# Patient Record
Sex: Male | Born: 1950 | Race: Black or African American | Hispanic: No | Marital: Married | Smoking: Former smoker
Health system: Southern US, Community
[De-identification: ages and names within clinical notes are randomized; demographics above are authoritative.]

## PROBLEM LIST (undated history)

## (undated) DIAGNOSIS — I119 Hypertensive heart disease without heart failure: Secondary | ICD-10-CM

## (undated) DIAGNOSIS — Z9289 Personal history of other medical treatment: Secondary | ICD-10-CM

## (undated) DIAGNOSIS — I43 Cardiomyopathy in diseases classified elsewhere: Secondary | ICD-10-CM

## (undated) DIAGNOSIS — N189 Chronic kidney disease, unspecified: Secondary | ICD-10-CM

## (undated) DIAGNOSIS — M1990 Unspecified osteoarthritis, unspecified site: Secondary | ICD-10-CM

## (undated) DIAGNOSIS — I351 Nonrheumatic aortic (valve) insufficiency: Secondary | ICD-10-CM

## (undated) DIAGNOSIS — E119 Type 2 diabetes mellitus without complications: Secondary | ICD-10-CM

## (undated) DIAGNOSIS — I10 Essential (primary) hypertension: Secondary | ICD-10-CM

## (undated) DIAGNOSIS — H269 Unspecified cataract: Secondary | ICD-10-CM

## (undated) DIAGNOSIS — H409 Unspecified glaucoma: Secondary | ICD-10-CM

## (undated) DIAGNOSIS — M109 Gout, unspecified: Secondary | ICD-10-CM

## (undated) DIAGNOSIS — E785 Hyperlipidemia, unspecified: Secondary | ICD-10-CM

## (undated) DIAGNOSIS — I509 Heart failure, unspecified: Secondary | ICD-10-CM

## (undated) HISTORY — PX: WRIST SURGERY: SHX841

## (undated) HISTORY — DX: Hypertensive heart disease without heart failure: I43

## (undated) HISTORY — DX: Hyperlipidemia, unspecified: E78.5

## (undated) HISTORY — PX: WISDOM TOOTH EXTRACTION: SHX21

## (undated) HISTORY — DX: Nonrheumatic aortic (valve) insufficiency: I35.1

## (undated) HISTORY — DX: Unspecified glaucoma: H40.9

## (undated) HISTORY — DX: Unspecified cataract: H26.9

## (undated) HISTORY — PX: HERNIA REPAIR: SHX51

## (undated) HISTORY — DX: Hypertensive heart disease without heart failure: I11.9

## (undated) HISTORY — PX: COLONOSCOPY: SHX174

## (undated) HISTORY — PX: CYST REMOVAL HAND: SHX6279

## (undated) HISTORY — DX: Personal history of other medical treatment: Z92.89

## (undated) HISTORY — DX: Chronic kidney disease, unspecified: N18.9

## (undated) HISTORY — DX: Type 2 diabetes mellitus without complications: E11.9

## (undated) HISTORY — PX: POLYPECTOMY: SHX149

---

## 1999-05-03 ENCOUNTER — Ambulatory Visit (HOSPITAL_COMMUNITY): Admission: RE | Admit: 1999-05-03 | Discharge: 1999-05-03 | Payer: Self-pay | Admitting: *Deleted

## 2001-11-07 ENCOUNTER — Ambulatory Visit (HOSPITAL_BASED_OUTPATIENT_CLINIC_OR_DEPARTMENT_OTHER): Admission: RE | Admit: 2001-11-07 | Discharge: 2001-11-07 | Payer: Self-pay | Admitting: General Surgery

## 2002-07-08 ENCOUNTER — Ambulatory Visit (HOSPITAL_COMMUNITY): Admission: RE | Admit: 2002-07-08 | Discharge: 2002-07-08 | Payer: Self-pay | Admitting: *Deleted

## 2002-07-08 ENCOUNTER — Encounter (INDEPENDENT_AMBULATORY_CARE_PROVIDER_SITE_OTHER): Payer: Self-pay | Admitting: Specialist

## 2002-11-02 ENCOUNTER — Emergency Department (HOSPITAL_COMMUNITY): Admission: EM | Admit: 2002-11-02 | Discharge: 2002-11-03 | Payer: Self-pay | Admitting: Emergency Medicine

## 2002-11-02 ENCOUNTER — Encounter: Payer: Self-pay | Admitting: Emergency Medicine

## 2004-06-30 ENCOUNTER — Ambulatory Visit: Payer: Self-pay | Admitting: Cardiology

## 2004-07-06 ENCOUNTER — Encounter: Payer: Self-pay | Admitting: Cardiovascular Disease

## 2004-07-06 ENCOUNTER — Ambulatory Visit: Payer: Self-pay

## 2004-08-05 ENCOUNTER — Ambulatory Visit: Payer: Self-pay | Admitting: Cardiology

## 2004-08-05 ENCOUNTER — Ambulatory Visit: Payer: Self-pay

## 2004-08-12 ENCOUNTER — Ambulatory Visit: Payer: Self-pay | Admitting: Cardiology

## 2004-08-17 ENCOUNTER — Ambulatory Visit: Payer: Self-pay | Admitting: Cardiology

## 2004-08-26 ENCOUNTER — Ambulatory Visit: Payer: Self-pay | Admitting: Cardiology

## 2004-09-06 ENCOUNTER — Ambulatory Visit: Payer: Self-pay | Admitting: Cardiology

## 2004-10-04 ENCOUNTER — Ambulatory Visit: Payer: Self-pay | Admitting: Cardiology

## 2006-05-30 ENCOUNTER — Ambulatory Visit (HOSPITAL_COMMUNITY): Admission: RE | Admit: 2006-05-30 | Discharge: 2006-05-30 | Payer: Self-pay | Admitting: Family Medicine

## 2006-06-06 ENCOUNTER — Ambulatory Visit: Payer: Self-pay | Admitting: Cardiology

## 2006-06-15 ENCOUNTER — Ambulatory Visit: Payer: Self-pay

## 2006-06-15 ENCOUNTER — Encounter: Payer: Self-pay | Admitting: Cardiology

## 2007-01-19 ENCOUNTER — Ambulatory Visit: Payer: Self-pay | Admitting: Cardiology

## 2007-06-19 ENCOUNTER — Ambulatory Visit: Payer: Self-pay | Admitting: Cardiology

## 2008-01-27 ENCOUNTER — Emergency Department (HOSPITAL_COMMUNITY): Admission: EM | Admit: 2008-01-27 | Discharge: 2008-01-27 | Payer: Self-pay | Admitting: Emergency Medicine

## 2008-05-06 ENCOUNTER — Encounter: Admission: RE | Admit: 2008-05-06 | Discharge: 2008-05-06 | Payer: Self-pay | Admitting: Family Medicine

## 2008-12-28 ENCOUNTER — Emergency Department (HOSPITAL_COMMUNITY): Admission: EM | Admit: 2008-12-28 | Discharge: 2008-12-28 | Payer: Self-pay | Admitting: Emergency Medicine

## 2009-03-07 DIAGNOSIS — I10 Essential (primary) hypertension: Secondary | ICD-10-CM

## 2009-03-07 DIAGNOSIS — M109 Gout, unspecified: Secondary | ICD-10-CM

## 2009-03-07 DIAGNOSIS — I5042 Chronic combined systolic (congestive) and diastolic (congestive) heart failure: Secondary | ICD-10-CM | POA: Insufficient documentation

## 2009-03-07 DIAGNOSIS — I119 Hypertensive heart disease without heart failure: Secondary | ICD-10-CM | POA: Insufficient documentation

## 2009-03-10 ENCOUNTER — Ambulatory Visit: Payer: Self-pay | Admitting: Cardiology

## 2009-03-12 LAB — CONVERTED CEMR LAB
CO2: 29 meq/L (ref 19–32)
Calcium: 9.4 mg/dL (ref 8.4–10.5)
Chloride: 106 meq/L (ref 96–112)
Creatinine, Ser: 1.7 mg/dL — ABNORMAL HIGH (ref 0.4–1.5)
Glucose, Bld: 105 mg/dL — ABNORMAL HIGH (ref 70–99)

## 2009-03-16 ENCOUNTER — Telehealth: Payer: Self-pay | Admitting: Cardiology

## 2009-03-23 ENCOUNTER — Telehealth (INDEPENDENT_AMBULATORY_CARE_PROVIDER_SITE_OTHER): Payer: Self-pay | Admitting: *Deleted

## 2009-03-23 ENCOUNTER — Encounter: Payer: Self-pay | Admitting: Cardiology

## 2009-03-23 ENCOUNTER — Ambulatory Visit: Payer: Self-pay

## 2009-04-03 ENCOUNTER — Encounter (INDEPENDENT_AMBULATORY_CARE_PROVIDER_SITE_OTHER): Payer: Self-pay | Admitting: *Deleted

## 2009-04-15 ENCOUNTER — Ambulatory Visit: Payer: Self-pay | Admitting: Cardiology

## 2009-10-06 ENCOUNTER — Telehealth: Payer: Self-pay | Admitting: Cardiology

## 2009-10-14 ENCOUNTER — Ambulatory Visit: Payer: Self-pay | Admitting: Cardiology

## 2010-07-11 ENCOUNTER — Encounter: Payer: Self-pay | Admitting: Cardiology

## 2010-07-12 ENCOUNTER — Telehealth (INDEPENDENT_AMBULATORY_CARE_PROVIDER_SITE_OTHER): Payer: Self-pay | Admitting: *Deleted

## 2010-07-27 NOTE — Progress Notes (Signed)
Summary: refill meds/ pt need meds today going out of today in am   Phone Note Refill Request Call back at Home Phone [phone] Message from:  Patient on October 06, 2009 3:10 PM  Refills Requested: Medication #1:  CARVEDILOL 12.5 MG TABS 1 tab two times a day [facility] [address]   Method Requested: Fax to [facility] Initial call taken by: Neil Crouch,  October 06, 2009 3:11 PM Reason for Call: Talk to Nurse Summary of Call: pt going out of town in am , need meds today./     Prescriptions: CARVEDILOL 12.5 MG TABS (CARVEDILOL) 1 tab two times a day  #60 x 11   Entered by:   Julaine Hua, CMA   Authorized by:   Renella Cunas, MD, [facility]   Signed by:   Julaine Hua, CMA on 10/06/2009   Method used:   Telephoned to ...       [facility]  [address] [id number] (retail)       [address]       [place], [place]  [address]       Ph: [phone] or [phone]       Fax: [phone]   RxID:   [id number]

## 2010-07-27 NOTE — Assessment & Plan Note (Signed)
Summary: per check out/sf  Medications Added COLCRYS 0.6 MG TABS (COLCHICINE) 1 tab two times a day PREDNISONE 20 MG TABS (PREDNISONE) taper dose      Allergies Added: NKDA  Visit Type:  6 mo f/u Primary Provider:  Arlyss Queen  CC:  edema/feet due to gout...no other complaints today.  History of Present Illness: Mr Arcari comes in today for evaluation management hypertension, hypertensive cardiomyopathy, and obesity.  He has not lost any weight. His blood pressures at that goal he is had him checked outside the office. He just started prednisone couple days ago for a gouty arthritis attack. His diastolic blood pressure today is 90.  He says he can return and 2 vegetarian to avoid gouty arthritis attacks. He says when he eats 2 hamburgers back the back is when he gets a flare. I have encouraged him to do so.  Current Medications (verified): 1)  Carvedilol 12.5 Mg Tabs (Carvedilol) .Marland Kitchen.. 1 Tab Two Times A Day 2)  Norvasc 10 Mg Tabs (Amlodipine Besylate) .Marland Kitchen.. 1 Tab Once Daily 3)  Lisinopril 40 Mg Tabs (Lisinopril) .Marland Kitchen.. 1 Once Daily 4)  Allopurinol 100 Mg Tabs (Allopurinol) .Marland Kitchen.. 1 Tab Two Times A Day 5)  Colcrys 0.6 Mg Tabs (Colchicine) .Marland Kitchen.. 1 Tab Two Times A Day 6)  Prednisone 20 Mg Tabs (Prednisone) .... 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 ..quit 06/27/73 Regular Exercise - no  Risk Factors: Exercise: no  (03/07/2009)  Risk Factors: Smoking Status: quit (03/07/2009)  Review of Systems       negative other than history of present illness  Vital Signs:  Patient profile:   60 year old male Height:      71 inches Weight:      243 pounds BMI:     34.01 Pulse rate:   68 / minute Pulse rhythm:   regular BP sitting:   136 / 90  (left arm) Cuff size:   large  Vitals Entered By: Julaine Hua, CMA (October 14, 2009 10:31 AM)  Physical Exam  General:  obese.   Head:  normocephalic and atraumatic Eyes:  PERRLA/EOM intact; conjunctiva and lids normal. Neck:  Neck supple, no JVD. No masses, thyromegaly or abnormal cervical nodes. Chest Derell Bruun:  no deformities or breast masses noted Lungs:  Clear bilaterally to auscultation and percussion. Heart:  Non-displaced PMI, chest non-tender; regular rate and rhythm, S1, S2 without murmurs, rubs or gallops. Carotid upstroke normal, no bruit. Normal abdominal aortic size, no bruits. Femorals normal pulses, no bruits. Pedals normal pulses. No edema, no varicosities. Msk:  decreased ROM.   Pulses:  pulses normal in all 4 extremities Extremities:  trace left pedal edema and trace right pedal edema.   Neurologic:  Alert and oriented x 3. Skin:  Intact without lesions or rashes. Psych:  Normal affect.   Impression & Recommendations:  Problem # 1:  HYPERTENSION (ICD-401.9) His blood pressure when he has checked his manic goal. Today his diastolic is borderline. Overall is much better. I've asked him to lose some  weight which he thinks he can because of the changing his diet for gout reasons. He says he can become a vegetarian. I'll see him back in 6 months. His updated medication list for this problem includes:    Carvedilol 12.5 Mg Tabs (Carvedilol) .Marland Kitchen... 1 tab two times a day    Norvasc 10 Mg Tabs (Amlodipine besylate) .Marland Kitchen... 1 tab once daily    Lisinopril 40 Mg Tabs (Lisinopril) .Marland Kitchen... 1 once daily  Problem # 2:  CARDIOMYOPATHY, HYPERTENSIVE  (ICD-402.90) Assessment: Unchanged  His updated medication list for this problem includes:    Carvedilol 12.5 Mg Tabs (Carvedilol) .Marland Kitchen... 1 tab two times a day    Norvasc 10 Mg Tabs (Amlodipine besylate) .Marland Kitchen... 1 tab once daily    Lisinopril 40 Mg Tabs (Lisinopril) .Marland Kitchen... 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 1 tab two times a day    Norvasc 10 Mg Tabs (Amlodipine besylate) .Marland Kitchen... 1 tab once daily    Lisinopril 40 Mg Tabs (Lisinopril) .Marland Kitchen... 1 once daily  Patient Instructions: 1)  Your physician recommends that you schedule a follow-up appointment in: [place] 2)  Your physician recommends that you continue on your current medications as directed. Please refer to the Current Medication list given to you today.

## 2010-07-29 NOTE — Progress Notes (Signed)
Summary: Faxed Records to [facility] at Urgent Care.   Faxed Records to [facility] at Urgent Care. LABS [phone] Pho:[phone] John Hogan  July 12, 2010 9:49 AM

## 2010-07-29 NOTE — Letter (Signed)
Summary: [facility]  [facility]   Imported By: Marilynne Drivers 07/19/2010 17:08:01  _____________________________________________________________________  External Attachment:    Type:   Image     Comment:   External Document

## 2010-11-09 NOTE — Assessment & Plan Note (Signed)
[place] OFFICE NOTE   NAME:COOPERGalan, Mannor                         MRN:          [id number]  DATE:01/19/2007                            DOB:          1950-10-27    Mr. Witts returns today for further management of the following issues:  1. Hypertensive cardiomyopathy.  His EF in December 2007 had improved      from 35% to 50%.  He is currently New [place] Heart Association Class      II.  2. He has had a recent flare in his gout.  His blood pressure      increased for a short period of time as checked by Dr. Elder Cyphers.  His      blood pressure on followup once his gout resolved was 136/83.  His      blood work showed a potassium of 4.  Creatinine was 1.63.  BUN 16.   MEDICATIONS:  1. Coreg 6.25 b.i.d.  2. Norvasc 10 mg a day.  3. Lisinopril 10 mg a day.  4. Allopurinol 100 mg a day.   His blood pressure today is 122/81.  Pulse is 90 and regular.  His  weight is 208.  HEENT:  Normocephalic and atraumatic.  PERRLA.  Extraocular movements  intact.  Sclerae are muddy.  Facial symmetry is normal.  NECK:  Supple.  There is no JVD.  Carotid upstrokes were equal  bilaterally without bruits. No thyromegaly.  LUNGS:  Clear.  HEART:  Reveals a poorly appreciated PMI.  He has normal S1 and S2  without gallop.  ABDOMINAL EXAM:  Protuberant with good bowel sounds.  EXTREMITIES:  No cyanosis, clubbing, or edema.  Pulses are intact.  NEUROLOGIC:  Exam is intact.   I am pleased with how Mr. Freer has improved his LV function.  I would  like to decrease his resting heart rate in the 60 to 70 range with the  carvedilol.  We increased that today to 12.5 b.i.d.  I have renewed all  of his prescriptions.   He asked me about Viagra.  I think with his blood pressure under good  control, that is reasonable.  I have given him a prescription for that,  50 mg.   I will plan on seeing him back in 6 months.     Thomas C. Verl Blalock, MD,  [facility]  Electronically Signed    TCW/MedQ  DD: 01/19/2007  DT: 01/20/2007  Job #: [id number]   cc:   Orma Flaming, M.D.

## 2010-11-09 NOTE — Assessment & Plan Note (Signed)
[place] OFFICE NOTE   Hogan, John Hogan                         MRN:          [id number]  DATE:06/19/2007                            DOB:          09-08-1950    John Hogan comes in today for further management of the following  issues:  1. Chronic combined congestive heart failure.  2. Hypertensive cardiomyopathy.  3. Hypertension.  4. History of gouty arthritis.   He has been having no problems with heart issues.  Specifically no  orthopnea, PND, peripheral edema other than his left ankle which he says  is resolving gout.  He has had to actually get off his ankle for a few  days because of the swelling and pain.   He continues on:  1. Coreg 6.25 b.i.d.  2. Norvasc 10 mg p.o. b.i.d.  3. Lisinopril 10 mg a day.  4. Allopurinol 100 mg a day.   His blood pressure today is 126/80, his pulse is 50 and regular.  His  EKG is stable with sinus brady.  HEENT:  Normocephalic/atraumatic, PERRLA, extraocular movements intact,  sclerae are clear, facial symmetry is normal.  Carotid upstrokes were  equal bilaterally without bruits, no JVD, thyroid is not enlarged.  LUNGS:  Clear.  HEART:  Reveals a nondisplaced PMI, normal S1 and S2, no gallop.  ABDOMINAL:  Soft.  EXTREMITIES:  Reveal no edema except at his left ankle.  His ankle joint  is not hot but it is slightly tender.  Pulses are intact.  NEURO:  Intact.   His last ejection fraction was 50% December 2007.   ASSESSMENT/PLAN:  John Hogan is doing well from his cardiac standpoint.  I think he has resolving gout.  I called Dr. Lou Miner and we both  agreed for a brief course of non-steroidals with followup with them.  Depending on his uric acid level, Dr. Elder Cyphers said he might increase his  allopurinol from 100 mg a day to 300 mg a day.   I will see him back in 6 months.     Thomas C. Verl Blalock, MD, [facility]  Electronically Signed    TCW/MedQ  DD: 06/19/2007   DT: 06/19/2007  Job #: [id number]   cc:   Orma Flaming, M.D.

## 2010-11-12 NOTE — Op Note (Signed)
. [name]  Patient:    John Hogan, John Hogan Visit Number: [id number] MRN: [id number]          Service Type: DSU Location: [facility] Attending Physician:  Barbera Setters Dictated by:   Edsel Petrin. Dalbert Batman, M.D. Proc. Date: 11/07/01 Admit Date:  11/07/2001   CC:         Dr. Gerald Stabs Guest   Operative Report  PREOPERATIVE DIAGNOSIS:  Incarcerated umbilical hernia.  POSTOPERATIVE DIAGNOSIS:  Incarcerated umbilical hernia.  OPERATION PERFORMED:  Repair of umbilical hernia.  SURGEON:  Edsel Petrin. Dalbert Batman, M.D.  ANESTHESIA:  General endotracheal.  INDICATIONS FOR PROCEDURE:  The patient is a 60 year old black man who has had periumbilical pain for a long time.  Recent exam showed an incarcerated umbilical hernia that was tender.  There has been no change in his bowel habits.  On exam he has no evidence of inflammation, just an incarcerated umbilical hernia.  He is brought to the operating room electively.  DESCRIPTION OF PROCEDURE:  Following the induction of general endotracheal anesthesia, the patients abdomen was prepped and draped in sterile fashion. A curved transverse incision was made below the umbilicus.  Dissection was carried down through the subcutaneous tissue all the way down to the anterior abdominal wall fascia.  We dissected out his umbilical hernia sac and carefully, with sharp dissection, dissected this away from the umbilical skin. I found that he had a defect approximately 2 to 2.5 cm with incarcerated preperitoneal fat.  We debrided some of this fat away and reduced the rest. We undermined the subcutaneous tissue circumferentially and we found that the fascia was extremely thick and strong everywhere else.  We closed the hernia defect transversely with four interrupted sutures of #1 Novofil.  The two central sutures were vest over pants and the two corner sutures were simple sutures.  This provided a very secure overlapping  repair.  There was no bleeding.  The wound was irrigated with saline.  The subcutaneous tissue was closed with interrupted sutures of 3-0 Vicryl and the umbilicus was tacked down to the fascia to prevent protrusion.  The skin was closed with a running subcuticular suture of 4-0 Vicryl and Steri-Strips.  Clean bandages were placed.  The patient was taken to the recovery room in stable condition. Estimated blood loss was about 15 cc.  Complications were none.  Sponge, needle and instrument counts were correct. Dictated by:   Edsel Petrin. Dalbert Batman, M.D. Attending Physician:  Barbera Setters DD:  11/07/01 TD:  11/07/01 Job: [id number]

## 2010-11-12 NOTE — Assessment & Plan Note (Signed)
[place] OFFICE NOTE   NAME:COOPERAbby, Denis                         MRN:          [id number]  DATE:06/06/2006                            DOB:          Nov 12, 1950    SUBJECTIVE:  Mr. Leveck comes in today for further management of his  hypertensive cardiomyopathy and left ventricular systolic dysfunction.   I have not seen him in over one 1-1/2 years.  He was running out of his  Coreg, so he came back to visit.  He has no orthopnea or PND, but has  had some peripheral edema.  He denies any chest pain, tachy-  palpitations, pre-syncope or syncope.   MEDICATIONS:  1. Norvasc 10 mg q.d.  2. Lisinopril/hydrochlorothiazide 40 mg/25 mg q.d.  3. Coreg 6.25 mg b.i.d.   PHYSICAL EXAMINATION:  VITAL SIGNS:  Blood pressure today without his  Coreg was 140/90, pulse 60 and regular, weight 236 pounds, relatively  stable.  HEENT:  Normocephalic and atraumatic.  Pupils equal, round, reactive to  light and accommodation.  Extraocular movements intact.  Sclerae clear.  Facial symmetry is normal.  NECK:  Carotid upstrokes are equal bilaterally with a soft systolic  sound bilaterally.  His thyroid is not enlarged.  Trachea midline.  LUNGS:  Clear.  HEART:  Reveals a systolic murmur along the left sternal border,  radiating up to the neck.  He has a diastolic component, consistent with  his AI.  ABDOMEN:  Protuberant.  Good bowel sounds.  EXTREMITIES:  No cyanosis or clubbing, but there is 1+ edema.  His  pulses are intact.   Electrocardiogram shows a sinus bradycardia with a left axis deviation  and left ventricular dysfunction with strain.   I had a long talk with Mr. Saldarriaga.  I have renewed his medicines, all of  which are now generic.  I have arranged for him to have a 2-D  echocardiogram for following up on his LV function and his AI.  If these  are stable, will see him back in one year.     Thomas C. Verl Blalock, MD,  [facility]  Electronically Signed    TCW/MedQ  DD: 06/06/2006  DT: 06/06/2006  Job #: [id number]   cc:   Orma Flaming, M.D.

## 2010-11-12 NOTE — Op Note (Signed)
[place]. [facility]  Patient:    John Hogan                          MRN: [id number] Proc. Date: 05/03/99 Adm. Date:  [id number] Attending:  Jim Desanctis                           Operative Report  PROCEDURE:  Colonoscopy with polypectomy and biopsies.  INDICATIONS:  Family history of colon polyps.  ANESTHESIA:  Fentanyl 100 mcg and Versed 10 mg were given intravenous in divided doses.  PROCEDURE:  With the patient mildly sedated in the left lateral decubitus position the Olympus Videoscopic colonoscope was inserted in the rectum and passed under  direct vision to the cecum.  The cecum identified, the ileocecal valve and appendiceal valve orifice both of which were photographed.  From this point the  colonoscope was slowly withdrawn taking circumferential views of the entire colonic mucosa, stopping first at about the mid transverse colon where two polyps were een adjacent, one was on a short stalk, the other was on a much longer stalk.  They  were photographed and using snare cautery technique each was resected separately at a setting of 25/25 blended current.  Both were suctioned into the endoscopic trap for pathology.  The base of the larger one was recauterized for hemostasis although there was no active bleeding seen.  The endoscope was then pulled back further until a third polyp was seen and using hot biopsy forceps techniques again at a setting of 25/25 blended current it to was removed.  A fourth polyp was seen in the descending colon and this to was removed using snare cautery technique again at a setting of 25/25 blended current. Tissue was again retrieved by suction and the endoscope was withdrawn further taking circumferential views of the colonic mucosa until we pulled back to the rectum which appeared normal and direct view showed internal hemorrhoids in retroflexed view.  The endoscope was straightened and withdrawn.  The  patients vital signs nd pulse oximeter remained stable.  The patient tolerated the procedure well without apparent complications.  FINDINGS:  Polyp at transverse colon and also a polyp in the descending colon all removed.  Await biopsy report.  Patient will call me for results and follow up ith me as an outpatient.  Incidentally internal hemorrhoids were noted as well. DD:  05/03/99 TD:  05/04/99 Job: [id number]

## 2010-11-12 NOTE — Op Note (Signed)
   NAMECHAPMAN, TRETTIN NO.:  [id number]   MEDICAL RECORD NO.:  [id number]                   PATIENT TYPE:  AMB   LOCATION:  ENDO                                 FACILITY:  [facility]   PHYSICIAN:  Waverly Ferrari, M.D.                 DATE OF BIRTH:  05/24/1951   DATE OF PROCEDURE:  DATE OF DISCHARGE:                                 OPERATIVE REPORT   PROCEDURE:  Colonoscopy.   INDICATIONS:  Colon polyps.   ANESTHESIA:  Demerol 100 mg, Versed 10 mg.   DESCRIPTION OF PROCEDURE:  With the patient mildly sedated in the left  lateral decubitus position, the Olympus videoscopic colonoscope was inserted  in the rectum and passed under direct vision to the cecum identified by the  ileocecal valve and appendiceal orifice, both of which were photographed.  From this point, the colonoscope was slowly withdrawn, taking  circumferential views of the entire colonic mucosa stopping only first at  the ileocecal valve itself where there was a question whether I was seeing a  polyp behind the valve which I could not see very well under direct view, so  this area was biopsied.  It very well may have been tissue from the  ileocecal valve but we will await biopsy report.  The endoscope was then  withdrawn all the way to the rectum, stopping then next only in the  descending colon where a small polyp was seen and removed using snare  cautery technique.  Unfortunately, we could not retrieve the tissue despite  looking for it.  The patient's vital signs and pulse oximetry remained  stable, the patient tolerated the procedure well without apparent  complications.   FINDINGS:  1. Polyp of descending colon.  2. Questionable polyp behind the ileocecal valve that may have been part of     the valve, await biopsy report.   PLAN:  The patient will call me for results and follow up with me as an  outpatient.                                               Waverly Ferrari,  M.D.    GMO/MEDQ  D:  07/08/2002  T:  07/08/2002  Job:  [id number]

## 2011-11-27 ENCOUNTER — Emergency Department (HOSPITAL_COMMUNITY)
Admission: EM | Admit: 2011-11-27 | Discharge: 2011-11-27 | Disposition: A | Discharge status: Home / Self Care | Payer: BC Managed Care – PPO | Attending: Emergency Medicine | Admitting: Emergency Medicine

## 2011-11-27 ENCOUNTER — Encounter (HOSPITAL_COMMUNITY): Payer: Self-pay | Admitting: Emergency Medicine

## 2011-11-27 DIAGNOSIS — Z79899 Other long term (current) drug therapy: Secondary | ICD-10-CM | POA: Insufficient documentation

## 2011-11-27 DIAGNOSIS — M109 Gout, unspecified: Secondary | ICD-10-CM | POA: Insufficient documentation

## 2011-11-27 DIAGNOSIS — R0789 Other chest pain: Secondary | ICD-10-CM

## 2011-11-27 DIAGNOSIS — I10 Essential (primary) hypertension: Secondary | ICD-10-CM | POA: Insufficient documentation

## 2011-11-27 HISTORY — DX: Essential (primary) hypertension: I10

## 2011-11-27 HISTORY — DX: Gout, unspecified: M10.9

## 2011-11-27 LAB — DIFFERENTIAL
Eosinophils Relative: 6 % — ABNORMAL HIGH (ref 0–5)
Lymphocytes Relative: 48 % — ABNORMAL HIGH (ref 12–46)
Lymphs Abs: 1.6 10*3/uL (ref 0.7–4.0)
Monocytes Absolute: 0.3 10*3/uL (ref 0.1–1.0)
Monocytes Relative: 8 % (ref 3–12)
Neutro Abs: 1.2 10*3/uL — ABNORMAL LOW (ref 1.7–7.7)

## 2011-11-27 LAB — COMPREHENSIVE METABOLIC PANEL
BUN: 15 mg/dL (ref 6–23)
CO2: 29 mEq/L (ref 19–32)
Calcium: 9.3 mg/dL (ref 8.4–10.5)
Chloride: 102 mEq/L (ref 96–112)
Creatinine, Ser: 1.41 mg/dL — ABNORMAL HIGH (ref 0.50–1.35)
GFR calc Af Amer: 60 mL/min — ABNORMAL LOW (ref >90)
GFR calc non Af Amer: 52 mL/min — ABNORMAL LOW (ref >90)
Glucose, Bld: 99 mg/dL (ref 70–99)
Total Bilirubin: 0.7 mg/dL (ref 0.3–1.2)

## 2011-11-27 LAB — CBC
HCT: 41.1 % (ref 39.0–52.0)
Hemoglobin: 13.9 g/dL (ref 13.0–17.0)
MCV: 88.2 fL (ref 78.0–100.0)
RDW: 13.2 % (ref 11.5–15.5)
WBC: 3.3 10*3/uL — ABNORMAL LOW (ref 4.0–10.5)

## 2011-11-27 LAB — TROPONIN I: Troponin I: <0.3 ng/mL (ref <0.30)

## 2011-11-27 NOTE — Discharge Instructions (Signed)
Chest Pain (Nonspecific) It is often hard to give a specific diagnosis for the cause of chest pain. There is always a chance that your pain could be related to something serious, such as a heart attack or a blood clot in the lungs. You need to follow up with your caregiver for further evaluation. CAUSES   Heartburn.   Pneumonia or bronchitis.   Anxiety or stress.   Inflammation around your heart (pericarditis) or lung (pleuritis or pleurisy).   A blood clot in the lung.   A collapsed lung (pneumothorax). It can develop suddenly on its own (spontaneous pneumothorax) or from injury (trauma) to the chest.   Shingles infection (herpes zoster virus).  The chest wall is composed of bones, muscles, and cartilage. Any of these can be the source of the pain.  The bones can be bruised by injury.   The muscles or cartilage can be strained by coughing or overwork.   The cartilage can be affected by inflammation and become sore (costochondritis).  DIAGNOSIS  Lab tests or other studies, such as X-rays, electrocardiography, stress testing, or cardiac imaging, may be needed to find the cause of your pain.  TREATMENT   Treatment depends on what may be causing your chest pain. Treatment may include:   Acid blockers for heartburn.   Anti-inflammatory medicine.   Pain medicine for inflammatory conditions.   Antibiotics if an infection is present.   You may be advised to change lifestyle habits. This includes stopping smoking and avoiding alcohol, caffeine, and chocolate.   You may be advised to keep your head raised (elevated) when sleeping. This reduces the chance of acid going backward from your stomach into your esophagus.   Most of the time, nonspecific chest pain will improve within 2 to 3 days with rest and mild pain medicine.  HOME CARE INSTRUCTIONS   If antibiotics were prescribed, take your antibiotics as directed. Finish them even if you start to feel better.   For the next few  days, avoid physical activities that bring on chest pain. Continue physical activities as directed.   Do not smoke.   Avoid drinking alcohol.   Only take over-the-counter or prescription medicine for pain, discomfort, or fever as directed by your caregiver.   Follow your caregiver's suggestions for further testing if your chest pain does not go away.   Keep any follow-up appointments you made. If you do not go to an appointment, you could develop lasting (chronic) problems with pain. If there is any problem keeping an appointment, you must call to reschedule.  SEEK MEDICAL CARE IF:   You think you are having problems from the medicine you are taking. Read your medicine instructions carefully.   Your chest pain does not go away, even after treatment.   You develop a rash with blisters on your chest.  SEEK IMMEDIATE MEDICAL CARE IF:   You have increased chest pain or pain that spreads to your arm, neck, jaw, back, or abdomen.   You develop shortness of breath, an increasing cough, or you are coughing up blood.   You have severe back or abdominal pain, feel nauseous, or vomit.   You develop severe weakness, fainting, or chills.   You have a fever.  THIS IS AN EMERGENCY. Do not wait to see if the pain will go away. Get medical help at once. Call your local emergency services (911 in [place] Do not drive yourself to the hospital. MAKE SURE YOU:   Understand these instructions.     Will watch your condition.   Will get help right away if you are not doing well or get worse.  Document Released: 03/23/2005 Document Revised: 06/02/2011 Document Reviewed: 01/17/2008 [employer] Patient Information 2012 [employer]. 

## 2011-11-27 NOTE — ED Notes (Signed)
Pt presenting to ed with c/o headache pain and feeling like his blood pressure is elevated. Pt states he did not take his bp but he feels like it's up. Pt states no blurred vision, no nausea or vomiting at this time. Pt states his head just doesn't feel right. Pt denies chest pain at this time. Pt states he has rash on his bilateral arms x couple of weeks.

## 2011-11-27 NOTE — ED Provider Notes (Signed)
History     CSN: [id number]  Arrival date & time 11/27/11  1329   First MD Initiated Contact with Patient 11/27/11 1455      Chief Complaint  Patient presents with  . Headache    (Consider location/radiation/quality/duration/timing/severity/associated sxs/prior treatment) HPI Comments: Patient started Friday with "heartburn" after eating fish and chips.  He took tums and antacids and seemed to go away.  Since then, he has had a headache, feels weak, and feels like his bp is elevated.    Patient is a 61 y.o. male presenting with headaches. The history is provided by the patient.  Headache  This is a new problem. Episode onset: two days ago. The problem occurs constantly. The problem has not changed since onset.The headache is associated with nothing. The quality of the pain is described as dull.    Past Medical History  Diagnosis Date  . Gout   . Hypertension     Past Surgical History  Procedure Date  . Wrist surgery   . Hernia repair     No family history on file.  History  Substance Use Topics  . Smoking status: Former Research scientist (life sciences)  . Smokeless tobacco: Not on file  . Alcohol Use: No      Review of Systems  Neurological: Positive for headaches.  All other systems reviewed and are negative.    Allergies  Review of patient's allergies indicates no known allergies.  Home Medications   Current Outpatient Rx  Name Route Sig Dispense Refill  . ALLOPURINOL 300 MG PO TABS Oral Take 300 mg by mouth daily.    Marland Kitchen AMLODIPINE BESYLATE 10 MG PO TABS Oral Take 10 mg by mouth daily.      BP 143/77  Pulse 53  Temp(Src) 98 F (36.7 C) (Oral)  Resp 20  SpO2 100%  Physical Exam  Nursing note and vitals reviewed. Constitutional: He is oriented to person, place, and time. He appears well-developed and well-nourished. No distress.  HENT:  Head: Normocephalic.  Eyes: Pupils are equal, round, and reactive to light.  Neck: Normal range of motion. Neck supple.    Cardiovascular: Normal rate and regular rhythm.   No murmur heard. Pulmonary/Chest: Effort normal and breath sounds normal. No respiratory distress. He has no wheezes.  Abdominal: Soft. Bowel sounds are normal.  Musculoskeletal: Normal range of motion. He exhibits no edema.  Neurological: He is alert and oriented to person, place, and time. No cranial nerve deficit. He exhibits normal muscle tone. Coordination normal.  Skin: Skin is warm and dry. He is not diaphoretic.    ED Course  Procedures (including critical care time)   Labs Reviewed  CBC  DIFFERENTIAL  COMPREHENSIVE METABOLIC PANEL  TROPONIN I   No results found.   No diagnosis found.   Date: 11/27/2011  Rate: 47  Rhythm: sinus bradycardia  QRS Axis: left  Intervals: normal  ST/T Wave abnormalities: nonspecific ST changes  Conduction Disutrbances:none  Narrative Interpretation:   Old EKG Reviewed: changes noted    MDM  The patient presented after an episode of "heartburn".  The workup was unremarkable, except for the ekg which showed non-specific changes in the lateral and inferior leads.  I consulted Dr. Percival Spanish who came to see the patient and did not feel as though admission was indicated (see consult).  He will be discharged to home, return prn.        Veryl Speak, MD 11/27/11 [phone]

## 2011-11-27 NOTE — Consult Note (Signed)
CARDIOLOGY ADMISSION NOTE  Patient ID: John Hogan MRN: [id number] DOB/AGE: [birth year] 61 y.o.  Admit date: 11/27/2011 Primary Physician   Dr. Lou Miner Primary Cardiologist   None Chief Complaint    Chest pain  HPI:  The patient has a history of HTN.  He was evaluated in the past with an echo demonstrating LVH with a normal EF in 2010.  He did have an EF of 35% in 2007 felt to be secondary to HTN.  He reports a negative nuclear stress test in the past but I cannot find this.    He reports that he has been doing well and that he is complaint with medications.  He doesn't record his BP at home.  He came today because he thought that his BP might be high.  He had some reflux symptoms on Thursday that went away after TUMS.  On Friday he had a headache.  He was lethargic on Sat and came to the ER today for evaluation of his BP.  He denies any chest, neck or arm pain.  He has had no N/V.  He denies any SOB, PND or orthopnea.  He has no palpitations or syncope.  He denies fevers, cough or chills.  He is active on his job but doesn't exercise.  He has had no recent limitations.  In the ER the EKG showed inferior T wave inversion.  However, this is not changed from 2010.  His cardiac enzymes have been negative.  He feels well.  BP was elevated but is improved at the last reading.   Past Medical History  Diagnosis Date  . Gout   . Hypertension     Past Surgical History  Procedure Date  . Wrist surgery   . Hernia repair     Umbilical    No Known Allergies  Current Outpatient Prescriptions on File Prior to Encounter  Medication Sig Dispense Refill  . allopurinol (ZYLOPRIM) 300 MG tablet Take 300 mg by mouth daily.       Carvedilol  12.5  Take bid    . amLODipine (NORVASC) 10 MG tablet Take 10 mg by mouth daily.       History   Social History  . Marital Status: Married    Spouse Name: N/A    Number of Children: 2  . Years of Education: N/A   Occupational History  .  [employer]   Social History Main Topics  . Smoking status: Former Smoker    Types: Cigarettes  . Smokeless tobacco: Not on file   Comment: Only smoked two years  . Alcohol Use: No  . Drug Use: No  . Sexually Active: Not on file   Other Topics Concern  . Not on file   Social History Narrative   Lives at home with wife.    Family History  Problem Relation Age of Onset  . Hypertension Father   . Stroke Mother     deceased     ROS:  As stated in the HPI and negative for all other systems.  Physical Exam: Blood pressure 143/77, pulse 53, temperature 98 F (36.7 C), temperature source Oral, resp. rate 20, SpO2 100.00%.  GENERAL:  Well appearing HEENT:  Pupils equal round and reactive, fundi not visualized, oral mucosa unremarkable, upper denture NECK:  No jugular venous distention, waveform within normal limits, carotid upstroke brisk and symmetric, no bruits, no thyromegaly LYMPHATICS:  No cervical, inguinal adenopathy LUNGS:  Clear to auscultation bilaterally BACK:  No  CVA tenderness CHEST:  Unremarkable HEART:  PMI not displaced or sustained,S1 and S2 within normal limits, no S3, no S4, no clicks, no rubs, soft apical systolic murmur ABD:  Flat, positive bowel sounds normal in frequency in pitch, no bruits, no rebound, no guarding, no midline pulsatile mass, no hepatomegaly, no splenomegaly EXT:  2 plus pulses throughout, no edema, no cyanosis no clubbing SKIN:  No rashes no nodules NEURO:  Cranial nerves II through XII grossly intact, motor grossly intact throughout PSYCH:  Cognitively intact, oriented to person place and time  Labs: Lab Results  Component Value Date   BUN 15 11/27/2011   Lab Results  Component Value Date   CREATININE 1.41* 11/27/2011   Lab Results  Component Value Date   NA 140 11/27/2011   K 3.5 11/27/2011   CL 102 11/27/2011   CO2 29 11/27/2011   Lab Results  Component Value Date   TROPONINI <0.30 11/27/2011   Lab Results  Component Value Date    WBC 3.3* 11/27/2011   HGB 13.9 11/27/2011   HCT 41.1 11/27/2011   MCV 88.2 11/27/2011   PLT 182 11/27/2011   No results found for this basename: CHOL,  HDL,  LDLCALC,  LDLDIRECT,  TRIG,  CHOLHDL   Lab Results  Component Value Date   ALT 11 11/27/2011   AST 14 11/27/2011   ALKPHOS 83 11/27/2011   BILITOT 0.7 11/27/2011    EKG:  NSR rate 47.  LAD. Inferior T wave inversion.  LVH.  No change compared with previous ECG of 2010.  11/27/2011  ASSESSMENT AND PLAN:    1)  Chest pain:  Atypical.  He reported reflux treated with TUMS on Thursday and no symptoms since.  EKG shows inferior T wave inversion but is unchanged from an EKG on 03/10/09.  No objective evidence of ischemia.  Very low pretest probability of obstructive CAD.  He can be followed as an outpatient with an ETT.  We will call to arrange.  2)  Renal insufficiency:  Follow with primary provider  3)  HTN:  The blood pressure continues to be high. I have instructed the patient to record a blood pressure diary and recording this. This will be presented for my review and pending these results I will make further suggestions about changes in therapy for optimal blood pressure control.  SignedMinus Breeding 11/27/2011, 6:41 PM

## 2011-11-27 NOTE — ED Notes (Signed)
Pt in with report of a headache that started on 10-01-2022. Pain is more pronounced in front of head. Reports headache at 4/10. Headache has been constant. Has taken nothing to help relieve the pain since the onset. Alert and oriented. Ambulatory.

## 2011-12-01 ENCOUNTER — Other Ambulatory Visit: Payer: Self-pay | Admitting: *Deleted

## 2011-12-01 DIAGNOSIS — R079 Chest pain, unspecified: Secondary | ICD-10-CM

## 2011-12-01 DIAGNOSIS — I10 Essential (primary) hypertension: Secondary | ICD-10-CM

## 2011-12-01 DIAGNOSIS — E7800 Pure hypercholesterolemia, unspecified: Secondary | ICD-10-CM

## 2011-12-22 ENCOUNTER — Encounter: Payer: Self-pay | Admitting: Physician Assistant

## 2011-12-22 ENCOUNTER — Ambulatory Visit (INDEPENDENT_AMBULATORY_CARE_PROVIDER_SITE_OTHER): Payer: BC Managed Care – PPO | Admitting: Physician Assistant

## 2011-12-22 DIAGNOSIS — R079 Chest pain, unspecified: Secondary | ICD-10-CM

## 2011-12-22 DIAGNOSIS — I10 Essential (primary) hypertension: Secondary | ICD-10-CM

## 2011-12-22 NOTE — Procedures (Signed)
Exercise Treadmill Test  Pre-Exercise Testing Evaluation Rhythm: normal sinus  Rate: 63   PR:  .18 QRS:  .12  QT:  .34 QTc: .44     Test  Exercise Tolerance Test Ordering MD: Marijo File, MD  Interpreting MD: Richardson Dopp PA-C  Unique Test No: 1  Treadmill:  1  Indication for ETT: chest pain - rule out ischemia  Contraindication to ETT: No   Stress Modality: exercise - treadmill  Cardiac Imaging Performed: non   Protocol: standard Bruce - maximal  Max BP:  212/77  Max MPHR (bpm):  158 85% MPR (bpm):  134  MPHR obtained (bpm):  122 % MPHR obtained:  77%  Reached 85% MPHR (min:sec): n/a Total Exercise Time (min-sec):  6:07  Workload in METS:  7.1 Borg Scale: 15  Reason ETT Terminated:  patient's desire to stop    ST Segment Analysis At Rest: non-specific ST segment slurring With Exercise: no evidence of significant ST depression  Other Information Arrhythmia:  No Angina during ETT:  absent (0) Quality of ETT:  non-diagnostic  ETT Interpretation:  normal - no evidence of ischemia by ST analysis at submaximal exercise  Comments: Poor exercise tolerance. No chest pain. Normal BP response to exercise. No ST-T changes to suggest ischemia at submaximal exercise.   Recommendations: Schedule Lexiscan Myoview. Arrange follow up with Dr. Minus Breeding or me. Richardson Dopp, PA-C  9:20 AM 12/22/2011

## 2011-12-22 NOTE — Patient Instructions (Addendum)
Your physician has requested that you have a lexiscan myoview DX CHEST PAIN. For further information please visit [email or web address]. Please follow instruction sheet, as given.  01/10/12 WITH SCOTT WEAVER, PAC

## 2012-01-03 ENCOUNTER — Ambulatory Visit (HOSPITAL_COMMUNITY): Payer: BC Managed Care – PPO | Attending: Cardiology | Admitting: Radiology

## 2012-01-03 VITALS — BP 144/89 | Ht 71.0 in | Wt 215.0 lb

## 2012-01-03 DIAGNOSIS — R0609 Other forms of dyspnea: Secondary | ICD-10-CM | POA: Insufficient documentation

## 2012-01-03 DIAGNOSIS — R0989 Other specified symptoms and signs involving the circulatory and respiratory systems: Secondary | ICD-10-CM | POA: Insufficient documentation

## 2012-01-03 DIAGNOSIS — R0602 Shortness of breath: Secondary | ICD-10-CM

## 2012-01-03 DIAGNOSIS — R079 Chest pain, unspecified: Secondary | ICD-10-CM

## 2012-01-03 DIAGNOSIS — I10 Essential (primary) hypertension: Secondary | ICD-10-CM | POA: Insufficient documentation

## 2012-01-03 DIAGNOSIS — Z87891 Personal history of nicotine dependence: Secondary | ICD-10-CM | POA: Insufficient documentation

## 2012-01-03 MED ORDER — REGADENOSON 0.4 MG/5ML IV SOLN
0.4000 mg | Freq: Once | INTRAVENOUS | Status: AC
  Administered 2012-01-03: 0.4 mg via INTRAVENOUS

## 2012-01-03 MED ORDER — TECHNETIUM TC 99M TETROFOSMIN IV KIT
10.0000 | PACK | Freq: Once | INTRAVENOUS | Status: AC | PRN
  Administered 2012-01-03: 10 via INTRAVENOUS

## 2012-01-03 MED ORDER — TECHNETIUM TC 99M TETROFOSMIN IV KIT
30.0000 | PACK | Freq: Once | INTRAVENOUS | Status: AC | PRN
  Administered 2012-01-03: 30 via INTRAVENOUS

## 2012-01-03 NOTE — Progress Notes (Signed)
[name] [address] [phone]  Cardiology Nuclear Med Study  John Hogan is a 61 y.o. male     MRN : [id number]     DOB: 1950/11/03  Procedure Date: 01/03/2012  Nuclear Med Background Indication for Stress Test:  Evaluation for Ischemia, 11/27/11 [facility] CP/HTN with (-) enzymes/EKG History:  Cardiomyopathy, MPS: (-) per pt, records not available, 02/2009 ECHO: EF 60-65% LVH, 12/22/11 GXT: No ST,T changes exercise non Dx Cardiac Risk Factors: History of Smoking and Hypertension  Symptoms:  Chest Pain, DOE and SOB   Nuclear Pre-Procedure Caffeine/Decaff Intake:  None > 12 hrs NPO After: 7:30pm   Lungs:  clear O2 Sat: 99% on room air. IV 0.9% NS with Angio Cath:  22g  IV Site: R Antecubital x 1, tolerated well IV Started by:  Irven Baltimore, RN  Chest Size (in):  46 Cup Size: n/a  Height: 5\' 11"  (1.803 m)  Weight:  215 lb (97.523 kg)  BMI:  Body mass index is 29.99 kg/(m^2). Tech Comments:  n/a    Nuclear Med Study 1 or 2 day study: 1 day  Stress Test Type:  Treadmill/Lexiscan  Reading MD:   Loralie Champagne, MD  Order Authorizing Provider:  Minus Breeding, MD, and Richardson Dopp, [facility]  Resting Radionuclide: Technetium 2m Tetrofosmin  Resting Radionuclide Dose: 11.0 mCi   Stress Radionuclide:  Technetium 82m Tetrofosmin  Stress Radionuclide Dose: 33.0 mCi           Stress Protocol Rest HR: 45 Stress HR: 45  Rest BP: 144/89 Stress BP: 172/75  Exercise Time (min): n/a METS: n/a   Predicted Max HR: 158 bpm % Max HR: 56.33 bpm Rate Pressure Product: 15308   Dose of Adenosine (mg):  n/a Dose of Lexiscan: 0.4 mg  Dose of Atropine (mg): n/a Dose of Dobutamine: n/a mcg/kg/min (at max HR)  Stress Test Technologist: Perrin Maltese, EMT-P  Nuclear Technologist:  Charlton Amor, CNMT     Rest Procedure:  Myocardial perfusion imaging was performed at rest 45 minutes following the intravenous administration of Technetium 76m  Tetrofosmin. Rest ECG: NSR - Normal EKG  Stress Procedure:  The patient received IV Lexiscan 0.4 mg over 15-seconds with concurrent low level exercise and then Technetium 16m Tetrofosmin was injected at 30-seconds while the patient continued walking one more minute. There were no significant changes, lt. Headed, and fatigue with Lexiscan. Quantitative spect images were obtained after a 45-minute delay. Stress ECG: No significant change from baseline ECG  QPS Raw Data Images:  Normal; no motion artifact; normal heart/lung ratio. Stress Images:  Normal homogeneous uptake in all areas of the myocardium. Rest Images:  Normal homogeneous uptake in all areas of the myocardium. Subtraction (SDS):  There is no evidence of scar or ischemia. Transient Ischemic Dilatation (Normal <1.22):  1.02 Lung/Heart Ratio (Normal <0.45):  0.30  Quantitative Gated Spect Images QGS EDV:  207 ml QGS ESV:  97 ml  Impression Exercise Capacity:  Lexiscan with no exercise. BP Response:  Normal blood pressure response. Clinical Symptoms:  Lightheaded.  ECG Impression:  No significant ST segment change suggestive of ischemia. Comparison with Prior Nuclear Study: No images to compare  Overall Impression:  Normal stress nuclear study.  LV Ejection Fraction: 53%.  LV Wall Motion:  Low normal LV systolic function without discrete wall motion abnormality.  Loralie Champagne 01/03/2012

## 2012-01-10 ENCOUNTER — Ambulatory Visit (INDEPENDENT_AMBULATORY_CARE_PROVIDER_SITE_OTHER): Payer: BC Managed Care – PPO | Admitting: Physician Assistant

## 2012-01-10 ENCOUNTER — Encounter: Payer: Self-pay | Admitting: Physician Assistant

## 2012-01-10 VITALS — BP 139/77 | HR 84 | Ht 71.0 in | Wt 216.0 lb

## 2012-01-10 DIAGNOSIS — I359 Nonrheumatic aortic valve disorder, unspecified: Secondary | ICD-10-CM

## 2012-01-10 DIAGNOSIS — I119 Hypertensive heart disease without heart failure: Secondary | ICD-10-CM

## 2012-01-10 DIAGNOSIS — I351 Nonrheumatic aortic (valve) insufficiency: Secondary | ICD-10-CM

## 2012-01-10 DIAGNOSIS — I10 Essential (primary) hypertension: Secondary | ICD-10-CM

## 2012-01-10 DIAGNOSIS — I5042 Chronic combined systolic (congestive) and diastolic (congestive) heart failure: Secondary | ICD-10-CM

## 2012-01-10 NOTE — Patient Instructions (Addendum)
3 to 4 Gram Sodium Diet, No Added Salt (NAS) A 3 to 4 gram sodium diet restricts the amount of sodium in the diet to no more than 3 to 4 g or 3000 to 4000 mg daily. Limiting the amount of sodium is often used to help lower blood pressure. It is important if you have heart, liver, or kidney problems. Many foods contain sodium for flavor and sometimes as a preservative. When the amount of sodium in a diet needs to be low, it is important to know what to look for when choosing foods and drinks. The following includes some information and guidelines to help make it easier for you to adapt to a low sodium diet. QUICK TIPS  Do not add salt to food.   Avoid convenience items and fast food.   Choose unsalted snack foods.   Buy lower sodium products, often labeled as "lower sodium" or "no salt added."   Check food labels to learn how much sodium is in 1 serving.   When eating at a restaurant, ask that your food be prepared with less salt or none, if possible.  READING FOOD LABELS FOR SODIUM INFORMATION The nutrition facts label is a good place to find how much sodium is in foods. Look for products with no more than 500 to 600 mg of sodium per meal and no more than 150 mg per serving. Remember that 3 to 4 g = 3000 to 4000 mg. The food label may also list foods as:  Sodium-free: Less than 5 mg in a serving.   Very low sodium: 35 mg or less in a serving.   Low-sodium: 140 mg or less in a serving.   Light in sodium: 50% less sodium in a serving. For example, if a food that usually has 300 mg of sodium is changed to become light in sodium, it will have 150 mg of sodium.   Reduced sodium: 25% less sodium in a serving. For example, if a food that usually has 400 mg of sodium is changed to reduced sodium, it will have 300 mg of sodium.  CHOOSING FOODS Grains  Avoid: Salted crackers and snack items. Bread stuffing and biscuit mixes. Seasoned rice or pasta mixes.   Choose: Unsalted snack items.  English muffins, breads, and rolls. Homemade pancakes and waffles. Most cereals. Pasta.  Meats  Avoid: Salted, canned, smoked, spiced, pickled meats, including fish and poultry. Bacon, ham, sausage, cold cuts, hot dogs, anchovies.   Choose: Low-sodium canned tuna and salmon. Fresh or frozen meat, poultry, and fish.  Dairy  Avoid: Processed cheese and spreads. Cottage cheese. Buttermilk and condensed milk. Regular cheese.   Choose: Milk. Low-sodium cottage cheese. Yogurt. Sour cream. Low-sodium cheese.  Fruits and Vegetables  Avoid: Regular canned vegetables. Regular canned tomato sauce and paste. Frozen vegetables in sauces. Olives. Angie Fava. Relishes. Sauerkraut.   Choose: Low-sodium canned vegetables. Low-sodium tomato sauce and paste. Frozen or fresh vegetables. Fresh and frozen fruit.  Condiments  Avoid: Canned and packaged gravies. Worcestershire sauce. Tartar sauce. Barbecue sauce. Soy sauce. Steak sauce. Ketchup. Onion, garlic, and table salt. Meat flavorings and tenderizers.   Choose: Fresh and dried herbs and spices. Low-sodium varieties of mustard and ketchup. Lemon juice. Tabasco sauce. Horseradish.  SAMPLE 3 TO 4 GRAM SODIUM MEAL PLAN  Breakfast / Sodium (mg)  1 cup low-fat milk / A999333 mg   2 slices whole-wheat toast / 270 mg   1 tbs heart-healthy margarine / 153 mg   1 hard-boiled egg /  139 mg  Lunch / Sodium (mg)  1 cup raw carrots / 76 mg    cup hummus / 298 mg   1 cup low-fat milk / 143 mg    cup red grapes / 2 mg   1 cup low-sodium chicken and rice soup / 480 mg   10 low-sodium saltine crackers / 191 mg  Dinner / Sodium (mg)  1 cup whole-wheat pasta / 2 mg   1 cup tomato sauce / 1178 mg   3 oz lean ground beef / 57 mg   1 small side salad (1 cup raw spinach leaves,  cup cucumber,  cup yellow bell pepper) / 25 mg   1 tsp ranch dressing / 144 mg  Snack / Sodium (mg)  1 slice cheddar cheese / 258 mg   1 medium apple / 1 mg  Nutrient  Analysis  Calories: 2005   Protein: 85 g   Carbohydrate: 245 g   Fat: 78 g   Sodium: 3560 mg  Document Released: 06/13/2005 Document Revised: 06/02/2011 Document Reviewed: 09/14/2009 [facility] Patient Information 2012 [place], [place].   Your physician recommends that you return for lab work in: 01/20/12 for fasting lipid and liver panel and a bmet  Your physician has requested that you have an echocardiogram DX AORTIC INSUFFICIENCY. Echocardiography is a painless test that uses sound waves to create images of your heart. It provides your doctor with information about the size and shape of your heart and how well your heart's chambers and valves are working. This procedure takes approximately one hour. There are no restrictions for this procedure.  You have been referred to [place] DX HTN; PT NEEDS TO EST WITH PCP  Your physician wants you to follow-up in: [place] DR. [place]. You will receive a reminder letter in the mail two months in advance. If you don't receive a letter, please call our office to schedule the follow-up appointment.

## 2012-01-10 NOTE — Progress Notes (Signed)
[place], [place]  [address] Phone: [phone] Fax:  [phone]  Date:  01/10/2012   Name:  John Hogan   DOB:  1950/11/28   MRN:  [id number]  PCP:  No primary provider on file.  Primary Cardiologist:  Dr. Minus Breeding  Primary Electrophysiologist:  None    History of Present Illness: John Hogan is a 61 y.o. male who returns for follow up.  He has a remote history of hypertensive cardiomyopathy with an ejection fraction as low as 35%, HTN, gout.  He previously was followed by Dr. Verl Blalock.  Last echo 02/2009: Mild LVH, EF 60-65%, mild AI, ascending aorta mildly dilated, mild LAE.  He had been lost to follow up.  He was seen in the emergency room by Dr. Percival Spanish 11/27/11.  He complained of chest pain and was also concerned about his blood pressure which was elevated at 143/77.  He had inferior T wave inversions-unchanged from 02/2009.  His creatinine was slightly elevated at 1.41.  Cardiac markers were negative.  He was asked to monitor his blood pressure.  He was set up for an outpatient ETT.  He had poor exercise tolerance and only exercised for 6 minutes and did not achieve his target heart rate.  This was then switched to [facility].  Myoview 01/03/12: No scar or ischemia, EF 53%.  His heart rate was in the 40s when he presented to the emergency room.  He has not taken Coreg since that time.  Overall, he feels well.  He does get dyspnea with more extreme activities.  Otherwise, he denies chest pain, syncope, orthopnea, PND or edema.  He reports class II symptoms.   Potassium  Date/Time Value Range Status  11/27/2011  3:57 PM 3.5  3.5 - 5.1 mEq/L Final     Creatinine, Ser  Date/Time Value Range Status  11/27/2011  3:57 PM 1.41* 0.50 - 1.35 mg/dL Final    Past Medical History  Diagnosis Date  . Gout   . Hypertension   . Hypertensive cardiomyopathy     EF previously 35%;  echo 02/2009: Mild LVH, EF 60-65%, mild AI, ascending aorta mildly  dilated, mild LAE  . H/O exercise stress test     Myoview 01/03/12: No scar or ischemia, EF 53%    Current Outpatient Prescriptions  Medication Sig Dispense Refill  . allopurinol (ZYLOPRIM) 300 MG tablet Take 300 mg by mouth daily.      Marland Kitchen amLODipine (NORVASC) 10 MG tablet Take 10 mg by mouth daily.        Allergies: No Known Allergies  History  Substance Use Topics  . Smoking status: Former Smoker -- 1.0 packs/day for 2 years    Types: Cigarettes    Quit date: 01/10/1975  . Smokeless tobacco: Never Used   Comment: Only smoked two years  . Alcohol Use: No     ROS:  Please see the history of present illness.    All other systems reviewed and negative.   PHYSICAL EXAM: VS:  BP 139/77  Pulse 84  Ht 5\' 11"  (1.803 m)  Wt 216 lb (97.977 kg)  BMI 30.13 kg/m2 Well nourished, well developed, in no acute distress HEENT: normal Neck: no JVD Cardiac:  normal S1, S2; RRR; A999333 diastolic murmur along the left sternal border at the third ICS Lungs:  clear to auscultation bilaterally, no wheezing, rhonchi or rales Abd: soft, nontender, no hepatomegaly Ext: no edema Skin: warm and dry Neuro:  CNs 2-12  intact, no focal abnormalities noted  EKG:  Sinus rhythm, heart rate 82, left axis deviation, No change from prior tracing      ASSESSMENT AND PLAN:  1.  Hypertension Fairly well controlled.  I've asked him to limit his salt and to continue with his excellent job of weight loss. Continue current therapy. If his blood pressure remains uncontrolled, we could add back a lower dose of Coreg. Followup with Dr. Percival Spanish in 6 months. Arrange FLP and LFTs.  2.  Hypertensive cardiomyopathy EF preserved on recent myoview. No signs of volume overload.  3.  Aortic insufficiency No echo since 2010. I will update his echo now.  4.  Renal insufficiency Arrange follow up BMET. Refer to PCP.  Danton Sewer, PA-C  8:48 AM 01/10/2012

## 2012-01-18 ENCOUNTER — Ambulatory Visit (HOSPITAL_COMMUNITY): Payer: BC Managed Care – PPO | Attending: Internal Medicine | Admitting: Radiology

## 2012-01-18 ENCOUNTER — Other Ambulatory Visit (INDEPENDENT_AMBULATORY_CARE_PROVIDER_SITE_OTHER): Payer: BC Managed Care – PPO

## 2012-01-18 DIAGNOSIS — I428 Other cardiomyopathies: Secondary | ICD-10-CM | POA: Insufficient documentation

## 2012-01-18 DIAGNOSIS — I079 Rheumatic tricuspid valve disease, unspecified: Secondary | ICD-10-CM | POA: Insufficient documentation

## 2012-01-18 DIAGNOSIS — I351 Nonrheumatic aortic (valve) insufficiency: Secondary | ICD-10-CM

## 2012-01-18 DIAGNOSIS — I10 Essential (primary) hypertension: Secondary | ICD-10-CM | POA: Insufficient documentation

## 2012-01-18 DIAGNOSIS — E669 Obesity, unspecified: Secondary | ICD-10-CM | POA: Insufficient documentation

## 2012-01-18 DIAGNOSIS — I359 Nonrheumatic aortic valve disorder, unspecified: Secondary | ICD-10-CM

## 2012-01-18 LAB — BASIC METABOLIC PANEL
CO2: 30 mEq/L (ref 19–32)
Calcium: 9.2 mg/dL (ref 8.4–10.5)
Chloride: 104 mEq/L (ref 96–112)
Glucose, Bld: 85 mg/dL (ref 70–99)
Sodium: 140 mEq/L (ref 135–145)

## 2012-01-18 LAB — LIPID PANEL
Total CHOL/HDL Ratio: 3
Triglycerides: 62 mg/dL (ref 0.0–149.0)

## 2012-01-18 LAB — HEPATIC FUNCTION PANEL
AST: 16 U/L (ref 0–37)
Alkaline Phosphatase: 79 U/L (ref 39–117)
Total Bilirubin: 1 mg/dL (ref 0.3–1.2)

## 2012-01-18 NOTE — Progress Notes (Signed)
Echocardiogram performed.  

## 2012-02-09 ENCOUNTER — Encounter: Payer: Self-pay | Admitting: Gastroenterology

## 2012-02-16 ENCOUNTER — Encounter: Payer: Self-pay | Admitting: Internal Medicine

## 2012-02-16 ENCOUNTER — Other Ambulatory Visit (INDEPENDENT_AMBULATORY_CARE_PROVIDER_SITE_OTHER): Payer: BC Managed Care – PPO

## 2012-02-16 ENCOUNTER — Ambulatory Visit (INDEPENDENT_AMBULATORY_CARE_PROVIDER_SITE_OTHER): Payer: BC Managed Care – PPO | Admitting: Internal Medicine

## 2012-02-16 VITALS — BP 130/86 | HR 47 | Temp 97.7°F | Resp 16 | Ht 71.0 in | Wt 214.0 lb

## 2012-02-16 DIAGNOSIS — Z0000 Encounter for general adult medical examination without abnormal findings: Secondary | ICD-10-CM | POA: Insufficient documentation

## 2012-02-16 DIAGNOSIS — Z23 Encounter for immunization: Secondary | ICD-10-CM

## 2012-02-16 DIAGNOSIS — N181 Chronic kidney disease, stage 1: Secondary | ICD-10-CM

## 2012-02-16 DIAGNOSIS — I10 Essential (primary) hypertension: Secondary | ICD-10-CM

## 2012-02-16 DIAGNOSIS — N2889 Other specified disorders of kidney and ureter: Secondary | ICD-10-CM

## 2012-02-16 DIAGNOSIS — M109 Gout, unspecified: Secondary | ICD-10-CM

## 2012-02-16 DIAGNOSIS — E7800 Pure hypercholesterolemia, unspecified: Secondary | ICD-10-CM

## 2012-02-16 DIAGNOSIS — E785 Hyperlipidemia, unspecified: Secondary | ICD-10-CM | POA: Insufficient documentation

## 2012-02-16 DIAGNOSIS — I359 Nonrheumatic aortic valve disorder, unspecified: Secondary | ICD-10-CM

## 2012-02-16 DIAGNOSIS — I351 Nonrheumatic aortic (valve) insufficiency: Secondary | ICD-10-CM

## 2012-02-16 DIAGNOSIS — K635 Polyp of colon: Secondary | ICD-10-CM | POA: Insufficient documentation

## 2012-02-16 DIAGNOSIS — D126 Benign neoplasm of colon, unspecified: Secondary | ICD-10-CM

## 2012-02-16 LAB — BASIC METABOLIC PANEL
Chloride: 103 mEq/L (ref 96–112)
GFR: 65.43 mL/min (ref >60.00)
Potassium: 4 mEq/L (ref 3.5–5.1)
Sodium: 138 mEq/L (ref 135–145)

## 2012-02-16 LAB — LDL CHOLESTEROL, DIRECT: Direct LDL: 119.5 mg/dL

## 2012-02-16 LAB — LIPID PANEL
HDL: 70.3 mg/dL (ref >39.00)
Triglycerides: 53 mg/dL (ref 0.0–149.0)
VLDL: 10.6 mg/dL (ref 0.0–40.0)

## 2012-02-16 LAB — URINALYSIS, ROUTINE W REFLEX MICROSCOPIC
Bilirubin Urine: NEGATIVE
Hgb urine dipstick: NEGATIVE
Ketones, ur: NEGATIVE
Urine Glucose: NEGATIVE
Urobilinogen, UA: 0.2 (ref 0.0–1.0)

## 2012-02-16 LAB — PSA: PSA: 2.11 ng/mL (ref 0.10–4.00)

## 2012-02-16 LAB — TSH: TSH: 1.58 u[IU]/mL (ref 0.35–5.50)

## 2012-02-16 LAB — FECAL OCCULT BLOOD, GUAIAC: Fecal Occult Blood: NEGATIVE

## 2012-02-16 LAB — URIC ACID: Uric Acid, Serum: 8 mg/dL — ABNORMAL HIGH (ref 4.0–7.8)

## 2012-02-16 MED ORDER — EZETIMIBE-ATORVASTATIN 10-10 MG PO TABS: 1.0000 | ORAL_TABLET | Freq: Every day | ORAL | Status: DC

## 2012-02-16 NOTE — Progress Notes (Signed)
Subjective:    Patient ID: John Hogan, male    DOB: 02/16/1951, 61 y.o.   MRN: [id number]  Hyperlipidemia This is a new problem. This is a new diagnosis. The problem is uncontrolled. Recent lipid tests were reviewed and are variable. He has no history of chronic renal disease, diabetes, hypothyroidism, liver disease, obesity or nephrotic syndrome. Factors aggravating his hyperlipidemia include fatty foods. Pertinent negatives include no chest pain, focal sensory loss, focal weakness, leg pain, myalgias or shortness of breath. He is currently on no antihyperlipidemic treatment. Compliance problems include adherence to exercise and adherence to diet.  Risk factors for coronary artery disease include dyslipidemia, male sex, hypertension, obesity and a sedentary lifestyle.      Review of Systems  Constitutional: Negative for fever, chills, diaphoresis, activity change, appetite change, fatigue and unexpected weight change.  HENT: Negative.   Eyes: Negative.   Respiratory: Negative for cough, choking, chest tightness, shortness of breath, wheezing and stridor.   Cardiovascular: Negative for chest pain, palpitations and leg swelling.  Gastrointestinal: Negative for nausea, vomiting, abdominal pain, diarrhea, constipation, blood in stool, abdominal distention, anal bleeding and rectal pain.  Genitourinary: Negative.  Negative for dysuria, urgency, frequency, hematuria, flank pain, decreased urine volume and difficulty urinating.  Musculoskeletal: Negative for myalgias, back pain, joint swelling, arthralgias and gait problem.  Skin: Negative.   Neurological: Negative for dizziness, tremors, focal weakness, seizures, syncope, facial asymmetry, speech difficulty, weakness, light-headedness, numbness and headaches.  Hematological: Negative.   Psychiatric/Behavioral: Negative.        Objective:   Physical Exam  Vitals reviewed. Constitutional: He is oriented to person, place, and time. He appears  well-developed and well-nourished. No distress.  HENT:  Head: Normocephalic and atraumatic.  Mouth/Throat: Oropharynx is clear and moist. No oropharyngeal exudate.  Eyes: Conjunctivae are normal. Right eye exhibits no discharge. Left eye exhibits no discharge. No scleral icterus.  Neck: Normal range of motion. Neck supple. No JVD present. No tracheal deviation present. No thyromegaly present.  Cardiovascular: Normal rate, regular rhythm, S1 normal, S2 normal and intact distal pulses.  Exam reveals no gallop and no friction rub.   Murmur heard.  No systolic murmur is present   Decrescendo diastolic murmur is present with a grade of 1/6  Pulses:      Carotid pulses are 1+ on the right side, and 1+ on the left side.      Radial pulses are 1+ on the right side, and 1+ on the left side.       Femoral pulses are 1+ on the right side, and 1+ on the left side.      Popliteal pulses are 1+ on the right side, and 1+ on the left side.       Dorsalis pedis pulses are 1+ on the right side, and 1+ on the left side.       Posterior tibial pulses are 1+ on the right side, and 1+ on the left side.  Pulmonary/Chest: Effort normal and breath sounds normal. No stridor. No respiratory distress. He has no wheezes. He has no rales. He exhibits no tenderness.  Abdominal: Soft. Bowel sounds are normal. He exhibits no distension and no mass. There is no tenderness. There is no rebound and no guarding. Hernia confirmed negative in the right inguinal area and confirmed negative in the left inguinal area.  Genitourinary: Rectum normal, prostate normal, testes normal and penis normal. Rectal exam shows no external hemorrhoid, no internal hemorrhoid, no fissure, no mass, no tenderness  and anal tone normal. Guaiac negative stool. Prostate is not enlarged and not tender. Right testis shows no mass, no swelling and no tenderness. Right testis is descended. Left testis shows no mass, no swelling and no tenderness. 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 Uncircumcised. No phimosis, paraphimosis, hypospadias, penile erythema or penile tenderness. No discharge found.  Musculoskeletal: Normal range of motion. He exhibits no edema and no tenderness.  Lymphadenopathy:    He has no cervical adenopathy.       Right: No inguinal adenopathy present.       Left: No inguinal adenopathy present.  Neurological: He is alert and oriented to person, place, and time. He has normal reflexes. He displays normal reflexes. No cranial nerve deficit. He exhibits normal muscle tone. Coordination normal.  Skin: Skin is warm and dry. No rash noted. He is not diaphoretic. No erythema. No pallor.  Psychiatric: He has a normal mood and affect. His behavior is normal. Judgment and thought content normal.      Lab Results  Component Value Date   WBC 3.3* 11/27/2011   HGB 13.9 11/27/2011   HCT 41.1 11/27/2011   PLT 182 11/27/2011   GLUCOSE 85 01/18/2012   CHOL 206* 01/18/2012   TRIG 62.0 01/18/2012   HDL 66.30 01/18/2012   LDLDIRECT 129.9 01/18/2012   ALT 17 01/18/2012   AST 16 01/18/2012   NA 140 01/18/2012   K 4.2 01/18/2012   CL 104 01/18/2012   CREATININE 1.6* 01/18/2012   BUN 17 01/18/2012   CO2 30 01/18/2012      Assessment & Plan:

## 2012-02-16 NOTE — Assessment & Plan Note (Signed)
He has adequate BP control today

## 2012-02-16 NOTE — Patient Instructions (Signed)
Health Maintenance, Males A healthy lifestyle and preventative care can promote health and wellness.  Maintain regular health, dental, and eye exams.   Eat a healthy diet. Foods like vegetables, fruits, whole grains, low-fat dairy products, and lean protein foods contain the nutrients you need without too many calories. Decrease your intake of foods high in solid fats, added sugars, and salt. Get information about a proper diet from your caregiver, if necessary.   Regular physical exercise is one of the most important things you can do for your health. Most adults should get at least 150 minutes of moderate-intensity exercise (any activity that increases your heart rate and causes you to sweat) each week. In addition, most adults need muscle-strengthening exercises on 2 or more days a week.    Maintain a healthy weight. The body mass index (BMI) is a screening tool to identify possible weight problems. It provides an estimate of body fat based on height and weight. Your caregiver can help determine your BMI, and can help you achieve or maintain a healthy weight. For adults 20 years and older:   A BMI below 18.5 is considered underweight.   A BMI of 18.5 to 24.9 is normal.   A BMI of 25 to 29.9 is considered overweight.   A BMI of 30 and above is considered obese.   Maintain normal blood lipids and cholesterol by exercising and minimizing your intake of saturated fat. Eat a balanced diet with plenty of fruits and vegetables. Blood tests for lipids and cholesterol should begin at age 20 and be repeated every 5 years. If your lipid or cholesterol levels are high, you are over 50, or you are a high risk for heart disease, you may need your cholesterol levels checked more frequently.Ongoing high lipid and cholesterol levels should be treated with medicines, if diet and exercise are not effective.   If you smoke, find out from your caregiver how to quit. If you do not use tobacco, do not start.    If you choose to drink alcohol, do not exceed 2 drinks per day. One drink is considered to be 12 ounces (355 mL) of beer, 5 ounces (148 mL) of wine, or 1.5 ounces (44 mL) of liquor.   Avoid use of street drugs. Do not share needles with anyone. Ask for help if you need support or instructions about stopping the use of drugs.   High blood pressure causes heart disease and increases the risk of stroke. Blood pressure should be checked at least every 1 to 2 years. Ongoing high blood pressure should be treated with medicines if weight loss and exercise are not effective.   If you are 45 to 62 years old, ask your caregiver if you should take aspirin to prevent heart disease.   Diabetes screening involves taking a blood sample to check your fasting blood sugar level. This should be done once every 3 years, after age 45, if you are within normal weight and without risk factors for diabetes. Testing should be considered at a younger age or be carried out more frequently if you are overweight and have at least 1 risk factor for diabetes.   Colorectal cancer can be detected and often prevented. Most routine colorectal cancer screening begins at the age of 50 and continues through age 75. However, your caregiver may recommend screening at an earlier age if you have risk factors for colon cancer. On a yearly basis, your caregiver may provide home test kits to check for hidden   blood in the stool. Use of a small camera at the end of a tube, to directly examine the colon (sigmoidoscopy or colonoscopy), can detect the earliest forms of colorectal cancer. Talk to your caregiver about this at age 50, when routine screening begins. Direct examination of the colon should be repeated every 5 to 10 years through age 75, unless early forms of pre-cancerous polyps or small growths are found.   Hepatitis C blood testing is recommended for all people born from 1945 through 1965 and any individual with known risks for  hepatitis C.   Healthy men should no longer receive prostate-specific antigen (PSA) blood tests as part of routine cancer screening. Consult with your caregiver about prostate cancer screening.   Testicular cancer screening is not recommended for adolescents or adult males who have no symptoms. Screening includes self-exam, caregiver exam, and other screening tests. Consult with your caregiver about any symptoms you have or any concerns you have about testicular cancer.   Practice safe sex. Use condoms and avoid high-risk sexual practices to reduce the spread of sexually transmitted infections (STIs).   Use sunscreen with a sun protection factor (SPF) of 30 or greater. Apply sunscreen liberally and repeatedly throughout the day. You should seek shade when your shadow is shorter than you. Protect yourself by wearing long sleeves, pants, a wide-brimmed hat, and sunglasses year round, whenever you are outdoors.   Notify your caregiver of new moles or changes in moles, especially if there is a change in shape or color. Also notify your caregiver if a mole is larger than the size of a pencil eraser.   A one-time screening for abdominal aortic aneurysm (AAA) and surgical repair of large AAAs by sound wave imaging (ultrasonography) is recommended for ages 65 to 75 years who are current or former smokers.   Stay current with your immunizations.  Document Released: 12/10/2007 Document Revised: 06/02/2011 Document Reviewed: 11/08/2010 [employer] Patient Information 2012 [employer]. 

## 2012-02-16 NOTE — Assessment & Plan Note (Signed)
Exam done, vaccines were updated, labs ordered and reviewed, pt ed material was given

## 2012-02-16 NOTE — Assessment & Plan Note (Signed)
He has several risk factors for CAD and CVA so will start liptruzet

## 2012-02-16 NOTE — Assessment & Plan Note (Signed)
He is due for a repeat colonoscopy

## 2012-02-16 NOTE — Assessment & Plan Note (Signed)
I will recheck his renal function today with a BMP and a UA

## 2012-02-16 NOTE — Assessment & Plan Note (Signed)
I will check his uric acid level today

## 2012-02-20 ENCOUNTER — Encounter: Payer: Self-pay | Admitting: Gastroenterology

## 2012-02-20 ENCOUNTER — Ambulatory Visit (AMBULATORY_SURGERY_CENTER): Payer: BC Managed Care – PPO | Admitting: *Deleted

## 2012-02-20 VITALS — Ht 71.0 in | Wt 212.0 lb

## 2012-02-20 DIAGNOSIS — Z800 Family history of malignant neoplasm of digestive organs: Secondary | ICD-10-CM

## 2012-02-20 DIAGNOSIS — Z1211 Encounter for screening for malignant neoplasm of colon: Secondary | ICD-10-CM

## 2012-02-20 DIAGNOSIS — Z86010 Personal history of colonic polyps: Secondary | ICD-10-CM

## 2012-02-20 MED ORDER — MOVIPREP 100 G PO SOLR: ORAL | Status: DC

## 2012-02-21 ENCOUNTER — Encounter: Payer: Self-pay | Admitting: *Deleted

## 2012-02-22 ENCOUNTER — Telehealth: Payer: Self-pay

## 2012-02-22 DIAGNOSIS — E7800 Pure hypercholesterolemia, unspecified: Secondary | ICD-10-CM

## 2012-02-22 MED ORDER — ATORVASTATIN CALCIUM 40 MG PO TABS: 40.0000 mg | ORAL_TABLET | Freq: Every day | ORAL | Status: DC

## 2012-02-22 NOTE — Telephone Encounter (Signed)
Pharmacy notified.

## 2012-02-22 NOTE — Telephone Encounter (Signed)
Received fax from pharmacy stating that insurance will not cover Fritch  w/o a prior auth. Need to  call [phone]  to proceed with request (ID#   [id number] ) .  [employer] and pt must have tried and failed one of the following lovastatin, pravastatin, simvastatin, fenofibrate, atorvastatin or amlodipine/atorvastatin. Please advise Thanks

## 2012-02-22 NOTE — Telephone Encounter (Signed)
Changed to atorvastatin

## 2012-02-24 ENCOUNTER — Other Ambulatory Visit: Payer: Self-pay | Admitting: Internal Medicine

## 2012-03-05 ENCOUNTER — Encounter: Payer: Self-pay | Admitting: Gastroenterology

## 2012-03-05 ENCOUNTER — Ambulatory Visit (AMBULATORY_SURGERY_CENTER): Payer: BC Managed Care – PPO | Admitting: Gastroenterology

## 2012-03-05 VITALS — BP 162/110 | HR 56 | Temp 98.0°F | Resp 20 | Ht 71.0 in | Wt 212.0 lb

## 2012-03-05 DIAGNOSIS — Z86010 Personal history of colonic polyps: Secondary | ICD-10-CM

## 2012-03-05 DIAGNOSIS — Z1211 Encounter for screening for malignant neoplasm of colon: Secondary | ICD-10-CM

## 2012-03-05 DIAGNOSIS — Z800 Family history of malignant neoplasm of digestive organs: Secondary | ICD-10-CM

## 2012-03-05 DIAGNOSIS — K5730 Diverticulosis of large intestine without perforation or abscess without bleeding: Secondary | ICD-10-CM

## 2012-03-05 DIAGNOSIS — K635 Polyp of colon: Secondary | ICD-10-CM

## 2012-03-05 DIAGNOSIS — D126 Benign neoplasm of colon, unspecified: Secondary | ICD-10-CM

## 2012-03-05 MED ORDER — SODIUM CHLORIDE 0.9 % IV SOLN: 500.0000 mL | INTRAVENOUS | Status: DC

## 2012-03-05 NOTE — Op Note (Signed)
[facility]  [address] [place], [address]   COLONOSCOPY PROCEDURE REPORT  PATIENT: John Hogan, John Hogan  MR#: [id number] BIRTHDATE: [birth year] , 22  yrs. old GENDER: Male ENDOSCOPIST: Sable Feil, MD, [facility] REFERRED BY:  Janith Lima, M.D. PROCEDURE DATE:  03/05/2012 PROCEDURE:   Colonoscopy with snare polypectomy and Colonoscopy with biopsy ASA CLASS:   Class III INDICATIONS:patient's personal history of adenomatous colon polyps.  MEDICATIONS: propofol (Diprivan) 350mg  IV  DESCRIPTION OF PROCEDURE:   After the risks and benefits and of the procedure were explained, informed consent was obtained.  A digital rectal exam revealed no abnormalities of the rectum.    The LB [id number]  endoscope was introduced through the anus and advanced to the cecum, which was identified by both the appendix and ileocecal valve .  The quality of the prep was good, using MoviPrep .  The instrument was then slowly withdrawn as the colon was fully examined.     COLON FINDINGS: A sessile polyp ranging between 3-59mm in size was found at the cecum.  A biopsy was performed using cold forceps.   A smooth semi-pedunculated polyp ranging between 3-1mm in size was found at the hepatic flexure.  A polypectomy was performed using snare cautery.  The resection was complete and the polyp tissue was not retrieved.   Mild diverticulosis was noted in the descending colon and sigmoid colon.   A flat polyp ranging between 3-59mm in size was found in the sigmoid colon.  A polypectomy was performed using snare cautery.  The resection was complete and the polyp tissue was not retrieved.     Retroflexed views revealed no abnormalities.     The scope was then withdrawn from the patient and the procedure completed.  COMPLICATIONS: There were no complications. ENDOSCOPIC IMPRESSION: 1.   Sessile polyp ranging between 3-68mm in size was found at the cecum; biopsy was performed using cold  forceps 2.   Semi-pedunculated polyp ranging between 3-56mm in size was found at the hepatic flexure; polypectomy was performed using snare cautery 3.   Mild diverticulosis was noted in the descending colon and sigmoid colon 4.   Flat polyp ranging between 3-20mm in size was found in the sigmoid colon; polypectomy was performed using snare cautery  RECOMMENDATIONS: 1.  await biopsy results 2.  Repeat colonoscopy in 5 years if polyp adenomatous; otherwise 10 years 3.  continue current medications   REPEAT EXAM:  cc:  _______________________________ eSignedSable Feil, MD, [facility] 03/05/2012 10:17 AM

## 2012-03-05 NOTE — Progress Notes (Signed)
Patient with preoperative order for IV antibiotic SSI prophylaxis, antibiotic initiated on time. [phone])  Patient did not experience any of the following events: a burn prior to discharge; a fall within the facility; wrong site/side/patient/procedure/implant event; or a hospital transfer or hospital admission upon discharge from the facility. [phone])

## 2012-03-05 NOTE — Patient Instructions (Addendum)
YOU HAD AN ENDOSCOPIC PROCEDURE TODAY AT THE [facility]: Refer to the procedure report that was given to you for any specific questions about what was found during the examination.  If the procedure report does not answer your questions, please call your gastroenterologist to clarify.  If you requested that your care partner not be given the details of your procedure findings, then the procedure report has been included in a sealed envelope for you to review at your convenience later.  YOU SHOULD EXPECT: Some feelings of bloating in the abdomen. Passage of more gas than usual.  Walking can help get rid of the air that was put into your GI tract during the procedure and reduce the bloating. If you had a lower endoscopy (such as a colonoscopy or flexible sigmoidoscopy) you may notice spotting of blood in your stool or on the toilet paper. If you underwent a bowel prep for your procedure, then you may not have a normal bowel movement for a few days.  DIET: Your first meal following the procedure should be a light meal and then it is ok to progress to your normal diet.  A half-sandwich or bowl of soup is an example of a good first meal.  Heavy or fried foods are harder to digest and may make you feel nauseous or bloated.  Likewise meals heavy in dairy and vegetables can cause extra gas to form and this can also increase the bloating.  Drink plenty of fluids but you should avoid alcoholic beverages for 24 hours.  ACTIVITY: Your care partner should take you home directly after the procedure.  You should plan to take it easy, moving slowly for the rest of the day.  You can resume normal activity the day after the procedure however you should NOT DRIVE or use heavy machinery for 24 hours (because of the sedation medicines used during the test).    SYMPTOMS TO REPORT IMMEDIATELY: A gastroenterologist can be reached at any hour.  During normal business hours, 8:30 AM to 5:00 PM Monday through Friday,  call [phone].  After hours and on weekends, please call the GI answering service at [phone] who will take a message and have the physician on call contact you.   Following lower endoscopy (colonoscopy or flexible sigmoidoscopy):  Excessive amounts of blood in the stool  Significant tenderness or worsening of abdominal pains  Swelling of the abdomen that is new, acute  Fever of 100F or higher  FOLLOW UP: If any biopsies were taken you will be contacted by phone or by letter within the next 1-3 weeks.  Call your gastroenterologist if you have not heard about the biopsies in 3 weeks.  Our staff will call the home number listed on your records the next business day following your procedure to check on you and address any questions or concerns that you may have at that time regarding the information given to you following your procedure. This is a courtesy call and so if there is no answer at the home number and we have not heard from you through the emergency physician on call, we will assume that you have returned to your regular daily activities without incident.  SIGNATURES/CONFIDENTIALITY: You and/or your care partner have signed paperwork which will be entered into your electronic medical record.  These signatures attest to the fact that that the information above on your After Visit Summary has been reviewed and is understood.  Full responsibility of the confidentiality of this   discharge information lies with you and/or your care-partner.   Thank-you for choosing us for your healthcare needs. 

## 2012-03-06 ENCOUNTER — Telehealth: Payer: Self-pay

## 2012-03-06 NOTE — Telephone Encounter (Signed)
Left message on answering machine. 

## 2012-03-09 ENCOUNTER — Encounter: Payer: Self-pay | Admitting: Gastroenterology

## 2012-05-16 ENCOUNTER — Encounter: Payer: Self-pay | Admitting: Internal Medicine

## 2012-05-16 ENCOUNTER — Ambulatory Visit (INDEPENDENT_AMBULATORY_CARE_PROVIDER_SITE_OTHER): Payer: BC Managed Care – PPO | Admitting: Internal Medicine

## 2012-05-16 VITALS — BP 140/82 | HR 59 | Temp 97.0°F | Resp 16 | Ht 71.0 in | Wt 220.5 lb

## 2012-05-16 DIAGNOSIS — L2089 Other atopic dermatitis: Secondary | ICD-10-CM | POA: Insufficient documentation

## 2012-05-16 DIAGNOSIS — L301 Dyshidrosis [pompholyx]: Secondary | ICD-10-CM

## 2012-05-16 MED ORDER — FLUOCINONIDE-E 0.05 % EX CREA: TOPICAL_CREAM | Freq: Two times a day (BID) | CUTANEOUS | Status: DC

## 2012-05-16 MED ORDER — METHYLPREDNISOLONE ACETATE 80 MG/ML IJ SUSP
120.0000 mg | Freq: Once | INTRAMUSCULAR | Status: AC
  Administered 2012-05-16: 120 mg via INTRAMUSCULAR

## 2012-05-16 MED ORDER — HYDROXYZINE PAMOATE 25 MG PO CAPS: 25.0000 mg | ORAL_CAPSULE | Freq: Three times a day (TID) | ORAL | Status: DC | PRN

## 2012-05-16 NOTE — Patient Instructions (Signed)

## 2012-05-17 NOTE — Progress Notes (Signed)
  Subjective:    Patient ID: John Hogan, male    DOB: [birth year], 61 y.o.   MRN: [id number]  Rash This is a new problem. The current episode started in the past 7 days. The problem has been gradually worsening since onset. The affected locations include the abdomen, torso and back. The rash is characterized by itchiness, dryness, redness and scaling. He was exposed to nothing. Pertinent negatives include no anorexia, congestion, cough, diarrhea, eye pain, facial edema, fatigue, fever, joint pain, nail changes, rhinorrhea, shortness of breath, sore throat or vomiting. Past treatments include nothing. His past medical history is significant for eczema.      Review of Systems  Constitutional: Negative.  Negative for fever and fatigue.  HENT: Negative.  Negative for congestion, sore throat and rhinorrhea.   Eyes: Negative.  Negative for pain.  Respiratory: Negative.  Negative for cough and shortness of breath.   Cardiovascular: Negative.   Gastrointestinal: Negative.  Negative for vomiting, diarrhea and anorexia.  Genitourinary: Negative.   Musculoskeletal: Negative.  Negative for joint pain.  Skin: Positive for rash. Negative for nail changes, color change, pallor and wound.  Neurological: Negative.   Hematological: Negative for adenopathy. Does not bruise/bleed easily.  Psychiatric/Behavioral: Negative.        Objective:   Physical Exam  Vitals reviewed. Constitutional: He is oriented to person, place, and time. He appears well-developed and well-nourished. No distress.  HENT:  Head: Normocephalic and atraumatic.  Mouth/Throat: Oropharynx is clear and moist. No oropharyngeal exudate.  Eyes: Conjunctivae normal are normal. Right eye exhibits no discharge. Left eye exhibits no discharge. No scleral icterus.  Neck: Normal range of motion. Neck supple. No JVD present. No tracheal deviation present. No thyromegaly present.  Cardiovascular: Normal rate, regular rhythm, normal heart sounds  and intact distal pulses.  Exam reveals no gallop and no friction rub.   No murmur heard. Pulmonary/Chest: Effort normal and breath sounds normal. No stridor. No respiratory distress. He has no wheezes. He has no rales. He exhibits no tenderness.  Abdominal: Soft. Bowel sounds are normal. He exhibits no distension and no mass. There is no tenderness. There is no rebound and no guarding.  Musculoskeletal: Normal range of motion. He exhibits no edema and no tenderness.  Lymphadenopathy:    He has no cervical adenopathy.  Neurological: He is oriented to person, place, and time.  Skin: Skin is warm and dry. Rash noted. No abrasion, no bruising, no burn, no ecchymosis, no laceration, no lesion, no petechiae and no purpura noted. Rash is papular. Rash is not macular, not maculopapular, not nodular, not pustular, not vesicular and not urticarial. He is not diaphoretic. There is erythema. No pallor.          He has several patches of coalesced papules with scaling and erythema  Psychiatric: He has a normal mood and affect. His behavior is normal. Judgment and thought content normal.          Assessment & Plan:

## 2012-05-17 NOTE — Assessment & Plan Note (Signed)
Will treat him with an injection of depo-medrol IM and he will start using topical steroids and oral antihistamines

## 2012-06-15 ENCOUNTER — Ambulatory Visit (INDEPENDENT_AMBULATORY_CARE_PROVIDER_SITE_OTHER): Payer: BC Managed Care – PPO | Admitting: Internal Medicine

## 2012-06-15 ENCOUNTER — Encounter: Payer: Self-pay | Admitting: Internal Medicine

## 2012-06-15 ENCOUNTER — Other Ambulatory Visit (INDEPENDENT_AMBULATORY_CARE_PROVIDER_SITE_OTHER): Payer: BC Managed Care – PPO

## 2012-06-15 VITALS — BP 130/80 | HR 62 | Temp 97.8°F | Resp 16 | Wt 223.0 lb

## 2012-06-15 DIAGNOSIS — M109 Gout, unspecified: Secondary | ICD-10-CM

## 2012-06-15 DIAGNOSIS — N181 Chronic kidney disease, stage 1: Secondary | ICD-10-CM

## 2012-06-15 DIAGNOSIS — I359 Nonrheumatic aortic valve disorder, unspecified: Secondary | ICD-10-CM

## 2012-06-15 DIAGNOSIS — I10 Essential (primary) hypertension: Secondary | ICD-10-CM

## 2012-06-15 DIAGNOSIS — I351 Nonrheumatic aortic (valve) insufficiency: Secondary | ICD-10-CM

## 2012-06-15 LAB — BASIC METABOLIC PANEL
BUN: 20 mg/dL (ref 6–23)
CO2: 32 mEq/L (ref 19–32)
Chloride: 103 mEq/L (ref 96–112)
Creatinine, Ser: 1.4 mg/dL (ref 0.4–1.5)
Glucose, Bld: 101 mg/dL — ABNORMAL HIGH (ref 70–99)
Potassium: 4.2 mEq/L (ref 3.5–5.1)

## 2012-06-15 MED ORDER — COLCHICINE 0.6 MG PO TABS: 0.6000 mg | ORAL_TABLET | Freq: Two times a day (BID) | ORAL | Status: DC

## 2012-06-15 MED ORDER — ALLOPURINOL 300 MG PO TABS: 300.0000 mg | ORAL_TABLET | Freq: Every day | ORAL | Status: DC

## 2012-06-15 NOTE — Assessment & Plan Note (Signed)
He has adequate BP control 

## 2012-06-15 NOTE — Assessment & Plan Note (Signed)
I will recheck his renal function today 

## 2012-06-15 NOTE — Assessment & Plan Note (Signed)
I will check his uric acid level He will CONTINUE allopurinol and will restart colchicine

## 2012-06-15 NOTE — Progress Notes (Signed)
Subjective:    Patient ID: John Hogan, male    DOB: [birth year], 61 y.o.   MRN: [id number]  Hypertension This is a chronic problem. The current episode started more than 1 year ago. The problem is unchanged. The problem is controlled. Pertinent negatives include no anxiety, blurred vision, chest pain, headaches, malaise/fatigue, neck pain, orthopnea, palpitations, peripheral edema, PND, shortness of breath or sweats. Past treatments include calcium channel blockers. The current treatment provides moderate improvement. Compliance problems include exercise and diet.  Hypertensive end-organ damage includes kidney disease. Identifiable causes of hypertension include chronic renal disease.      Review of Systems  Constitutional: Negative for fever, chills, malaise/fatigue, diaphoresis, activity change, appetite change, fatigue and unexpected weight change.  HENT: Negative.  Negative for neck pain.   Eyes: Negative.  Negative for blurred vision.  Respiratory: Negative for cough, chest tightness, shortness of breath, wheezing and stridor.   Cardiovascular: Negative for chest pain, palpitations, orthopnea, leg swelling and PND.  Gastrointestinal: Negative.   Genitourinary: Negative.   Musculoskeletal: Positive for arthralgias (right 1st MTP joint). Negative for myalgias, back pain, joint swelling and gait problem.  Skin: Negative for color change, pallor, rash and wound.  Neurological: Negative.  Negative for headaches.  Hematological: Negative.  Negative for adenopathy. Does not bruise/bleed easily.  Psychiatric/Behavioral: Negative.        Objective:   Physical Exam  Vitals reviewed. Constitutional: He is oriented to person, place, and time. He appears well-developed and well-nourished. No distress.  HENT:  Head: Normocephalic and atraumatic.  Mouth/Throat: Oropharynx is clear and moist. No oropharyngeal exudate.  Eyes: Conjunctivae normal are normal. Right eye exhibits no discharge. Left  eye exhibits no discharge. No scleral icterus.  Neck: Normal range of motion. Neck supple. No JVD present. No tracheal deviation present. No thyromegaly present.  Cardiovascular: Normal rate, regular rhythm, S1 normal, S2 normal and intact distal pulses.  Exam reveals no gallop, no S3, no S4, no distant heart sounds and no friction rub.   Murmur heard.  No systolic murmur is present   Decrescendo diastolic murmur is present with a grade of 1/6  Pulmonary/Chest: Effort normal and breath sounds normal. No stridor. No respiratory distress. He has no wheezes. He has no rales. He exhibits no tenderness.  Abdominal: Soft. Bowel sounds are normal. He exhibits no distension and no mass. There is no tenderness. There is no rebound and no guarding.  Musculoskeletal: Normal range of motion. He exhibits no edema and no tenderness.       Feet:  Lymphadenopathy:    He has no cervical adenopathy.  Neurological: He is oriented to person, place, and time.  Skin: Skin is warm and dry. No rash noted. He is not diaphoretic. No erythema. No pallor.  Psychiatric: He has a normal mood and affect. His behavior is normal. Judgment and thought content normal.      Lab Results  Component Value Date   WBC 3.3* 11/27/2011   HGB 13.9 11/27/2011   HCT 41.1 11/27/2011   PLT 182 11/27/2011   GLUCOSE 88 02/16/2012   CHOL 205* 02/16/2012   TRIG 53.0 02/16/2012   HDL 70.30 02/16/2012   LDLDIRECT 119.5 02/16/2012   ALT 17 01/18/2012   AST 16 01/18/2012   NA 138 02/16/2012   K 4.0 02/16/2012   CL 103 02/16/2012   CREATININE 1.4 02/16/2012   BUN 16 02/16/2012   CO2 30 02/16/2012   TSH 1.58 02/16/2012   PSA 2.11 02/16/2012  Assessment & Plan:

## 2012-06-15 NOTE — Assessment & Plan Note (Signed)
No changes noted 

## 2012-06-15 NOTE — Patient Instructions (Addendum)
Gout Gout is an inflammatory condition (arthritis) caused by a buildup of uric acid crystals in the joints. Uric acid is a chemical that is normally present in the blood. Under some circumstances, uric acid can form into crystals in your joints. This causes joint redness, soreness, and swelling (inflammation). Repeat attacks are common. Over time, uric acid crystals can form into masses (tophi) near a joint, causing disfigurement. Gout is treatable and often preventable. CAUSES  The disease begins with elevated levels of uric acid in the blood. Uric acid is produced by your body when it breaks down a naturally found substance called purines. This also happens when you eat certain foods such as meats and fish. Causes of an elevated uric acid level include:  Being passed down from parent to child (heredity).  Diseases that cause increased uric acid production (obesity, psoriasis, some cancers).  Excessive alcohol use.  Diet, especially diets rich in meat and seafood.  Medicines, including certain cancer-fighting drugs (chemotherapy), diuretics, and aspirin.  Chronic kidney disease. The kidneys are no longer able to remove uric acid well.  Problems with metabolism. Conditions strongly associated with gout include:  Obesity.  High blood pressure.  High cholesterol.  Diabetes. Not everyone with elevated uric acid levels gets gout. It is not understood why some people get gout and others do not. Surgery, joint injury, and eating too much of certain foods are some of the factors that can lead to gout. SYMPTOMS   An attack of gout comes on quickly. It causes intense pain with redness, swelling, and warmth in a joint.  Fever can occur.  Often, only one joint is involved. Certain joints are more commonly involved:  Base of the big toe.  Knee.  Ankle.  Wrist.  Finger. Without treatment, an attack usually goes away in a few days to weeks. Between attacks, you usually will not have  symptoms, which is different from many other forms of arthritis. DIAGNOSIS  Your caregiver will suspect gout based on your symptoms and exam. Removal of fluid from the joint (arthrocentesis) is done to check for uric acid crystals. Your caregiver will give you a medicine that numbs the area (local anesthetic) and use a needle to remove joint fluid for exam. Gout is confirmed when uric acid crystals are seen in joint fluid, using a special microscope. Sometimes, blood, urine, and X-ray tests are also used. TREATMENT  There are 2 phases to gout treatment: treating the sudden onset (acute) attack and preventing attacks (prophylaxis). Treatment of an Acute Attack  Medicines are used. These include anti-inflammatory medicines or steroid medicines.  An injection of steroid medicine into the affected joint is sometimes necessary.  The painful joint is rested. Movement can worsen the arthritis.  You may use warm or cold treatments on painful joints, depending which works best for you.  Discuss the use of coffee, vitamin C, or cherries with your caregiver. These may be helpful treatment options. Treatment to Prevent Attacks After the acute attack subsides, your caregiver may advise prophylactic medicine. These medicines either help your kidneys eliminate uric acid from your body or decrease your uric acid production. You may need to stay on these medicines for a very long time. The early phase of treatment with prophylactic medicine can be associated with an increase in acute gout attacks. For this reason, during the first few months of treatment, your caregiver may also advise you to take medicines usually used for acute gout treatment. Be sure you understand your caregiver's directions.   You should also discuss dietary treatment with your caregiver. Certain foods such as meats and fish can increase uric acid levels. Other foods such as dairy can decrease levels. Your caregiver can give you a list of foods  to avoid. HOME CARE INSTRUCTIONS   Do not take aspirin to relieve pain. This raises uric acid levels.  Only take over-the-counter or prescription medicines for pain, discomfort, or fever as directed by your caregiver.  Rest the joint as much as possible. When in bed, keep sheets and blankets off painful areas.  Keep the affected joint raised (elevated).  Use crutches if the painful joint is in your leg.  Drink enough water and fluids to keep your urine clear or pale yellow. This helps your body get rid of uric acid. Do not drink alcoholic beverages. They slow the passage of uric acid.  Follow your caregiver's dietary instructions. Pay careful attention to the amount of protein you eat. Your daily diet should emphasize fruits, vegetables, whole grains, and fat-free or low-fat milk products.  Maintain a healthy body weight. SEEK MEDICAL CARE IF:   You have an oral temperature above 102 F (38.9 C).  You develop diarrhea, vomiting, or any side effects from medicines.  You do not feel better in 24 hours, or you are getting worse. SEEK IMMEDIATE MEDICAL CARE IF:   Your joint becomes suddenly more tender and you have:  Chills.  An oral temperature above 102 F (38.9 C), not controlled by medicine. MAKE SURE YOU:   Understand these instructions.  Will watch your condition.  Will get help right away if you are not doing well or get worse. Document Released: 06/10/2000 Document Revised: 09/05/2011 Document Reviewed: 09/21/2009 Exit[employer] Patient Information 2013 [employer].    

## 2012-06-17 ENCOUNTER — Emergency Department (INDEPENDENT_AMBULATORY_CARE_PROVIDER_SITE_OTHER)
Admission: EM | Admit: 2012-06-17 | Discharge: 2012-06-17 | Disposition: A | Discharge status: Home / Self Care | Payer: BC Managed Care – PPO | Source: Home / Self Care | Attending: Emergency Medicine | Admitting: Emergency Medicine

## 2012-06-17 ENCOUNTER — Encounter (HOSPITAL_COMMUNITY): Payer: Self-pay | Admitting: Emergency Medicine

## 2012-06-17 DIAGNOSIS — S335XXA Sprain of ligaments of lumbar spine, initial encounter: Secondary | ICD-10-CM

## 2012-06-17 DIAGNOSIS — S39012A Strain of muscle, fascia and tendon of lower back, initial encounter: Secondary | ICD-10-CM

## 2012-06-17 MED ORDER — MELOXICAM 15 MG PO TABS: 15.0000 mg | ORAL_TABLET | Freq: Every day | ORAL | Status: DC

## 2012-06-17 MED ORDER — METHOCARBAMOL 500 MG PO TABS: 500.0000 mg | ORAL_TABLET | Freq: Three times a day (TID) | ORAL | Status: DC

## 2012-06-17 MED ORDER — TRAMADOL HCL 50 MG PO TABS: 100.0000 mg | ORAL_TABLET | Freq: Three times a day (TID) | ORAL | Status: DC | PRN

## 2012-06-17 NOTE — ED Provider Notes (Signed)
Chief Complaint  Patient presents with  . Back Pain    History of Present Illness:   The patient is a 61 year old male who has had a two-day history of lower back pain without radiation. He denies any injury. The pain is worse if he stands up, bends, or this. It's better if he walks. He denies any numbness or tingling of lower extremities, bladder or bowel symptoms fever, chills, or weight loss.  Review of Systems:  Other than noted above, the patient denies any of the following symptoms: Systemic:  No fever, chills, severe fatigue, or unexplained weight loss. GI:  No abdominal pain, nausea, vomiting, diarrhea, constipation, incontinence of bowel, or blood in stool. GU:  No dysuria, frequency, urgency, or hematuria. No incontinence of urine or difficulty urinating.  M-S:  No neck pain, joint pain, arthritis, or myalgias. Neuro:  No paresthesias, saddle anesthesia, muscular weakness, or progressive neurological deficit.  [place]:  Past medical history, family history, social history, meds, and allergies were reviewed. Specifically, there is no history of cancer, major trauma, osteoporosis, immunosuppression, HIV, or IV or injection drug use.   Physical Exam:   Vital signs:  BP 135/85  Pulse 56  Temp 98 F (36.7 C) (Oral)  Resp 18  SpO2 98% General:  Alert, oriented, in no distress. Abdomen:  Soft, non-tender.  No organomegaly or mass.  No pulsatile midline abdominal mass or bruit. Back:  There was tenderness to palpation in lumbar spine. His back has a full range of motion with pain. Straight leg raising was negative. Neuro:  Normal muscle strength, sensations and DTRs. Extremities: Pedal pulses were full, there was no edema. Skin:  Clear, warm and dry.  No rash.  Assessment:  The encounter diagnosis was Lumbar strain.  Plan:   1.  The following meds were prescribed:   New Prescriptions   MELOXICAM (MOBIC) 15 MG TABLET    Take 1 tablet (15 mg total) by mouth daily.   METHOCARBAMOL  (ROBAXIN) 500 MG TABLET    Take 1 tablet (500 mg total) by mouth 3 (three) times daily.   TRAMADOL (ULTRAM) 50 MG TABLET    Take 2 tablets (100 mg total) by mouth every 8 (eight) hours as needed for pain.   2.  The patient was instructed in symptomatic care and handouts were given. 3.  The patient was told to return if becoming worse in any way, if no better in 2 weeks, and given some red flag symptoms that would indicate earlier return. 4.  The patient was encouraged to try to be as active as possible and given some exercises to do followed by moist heat.    Harden Mo, MD 06/17/12 [phone]

## 2012-06-17 NOTE — ED Notes (Signed)
C/o back pain for a couple of days now.  Denies any injury to back.

## 2012-07-17 ENCOUNTER — Ambulatory Visit (INDEPENDENT_AMBULATORY_CARE_PROVIDER_SITE_OTHER): Payer: Managed Care, Other (non HMO) | Admitting: Internal Medicine

## 2012-07-17 ENCOUNTER — Encounter: Payer: Self-pay | Admitting: Internal Medicine

## 2012-07-17 VITALS — BP 130/88 | HR 60 | Temp 97.2°F | Wt 228.4 lb

## 2012-07-17 DIAGNOSIS — M549 Dorsalgia, unspecified: Secondary | ICD-10-CM

## 2012-07-17 MED ORDER — CYCLOBENZAPRINE HCL 10 MG PO TABS: 10.0000 mg | ORAL_TABLET | Freq: Three times a day (TID) | ORAL | Status: DC | PRN

## 2012-07-17 MED ORDER — DICLOFENAC SODIUM 75 MG PO TBEC: 75.0000 mg | DELAYED_RELEASE_TABLET | Freq: Two times a day (BID) | ORAL | Status: DC

## 2012-07-17 NOTE — Patient Instructions (Signed)
Back Pain, Adult Low back pain is very common. About 1 in 5 people have back pain.The cause of low back pain is rarely dangerous. The pain often gets better over time.About half of people with a sudden onset of back pain feel better in just 2 weeks. About 8 in 10 people feel better by 6 weeks.  CAUSES Some common causes of back pain include:  Strain of the muscles or ligaments supporting the spine.  Wear and tear (degeneration) of the spinal discs.  Arthritis.  Direct injury to the back. DIAGNOSIS Most of the time, the direct cause of low back pain is not known.However, back pain can be treated effectively even when the exact cause of the pain is unknown.Answering your caregiver's questions about your overall health and symptoms is one of the most accurate ways to make sure the cause of your pain is not dangerous. If your caregiver needs more information, he or she may order lab work or imaging tests (X-rays or MRIs).However, even if imaging tests show changes in your back, this usually does not require surgery. HOME CARE INSTRUCTIONS For many people, back pain returns.Since low back pain is rarely dangerous, it is often a condition that people can learn to manageon their own.   Remain active. It is stressful on the back to sit or stand in one place. Do not sit, drive, or stand in one place for more than 30 minutes at a time. Take short walks on level surfaces as soon as pain allows.Try to increase the length of time you walk each day.  Do not stay in bed.Resting more than 1 or 2 days can delay your recovery.  Do not avoid exercise or work.Your body is made to move.It is not dangerous to be active, even though your back may hurt.Your back will likely heal faster if you return to being active before your pain is gone.  Pay attention to your body when you bend and lift. Many people have less discomfortwhen lifting if they bend their knees, keep the load close to their bodies,and  avoid twisting. Often, the most comfortable positions are those that put less stress on your recovering back.  Find a comfortable position to sleep. Use a firm mattress and lie on your side with your knees slightly bent. If you lie on your back, put a pillow under your knees.  Only take over-the-counter or prescription medicines as directed by your caregiver. Over-the-counter medicines to reduce pain and inflammation are often the most helpful.Your caregiver may prescribe muscle relaxant drugs.These medicines help dull your pain so you can more quickly return to your normal activities and healthy exercise.  Put ice on the injured area.  Put ice in a plastic bag.  Place a towel between your skin and the bag.  Leave the ice on for 15 to 20 minutes, 3 to 4 times a day for the first 2 to 3 days. After that, ice and heat may be alternated to reduce pain and spasms.  Ask your caregiver about trying back exercises and gentle massage. This may be of some benefit.  Avoid feeling anxious or stressed.Stress increases muscle tension and can worsen back pain.It is important to recognize when you are anxious or stressed and learn ways to manage it.Exercise is a great option. SEEK MEDICAL CARE IF:  You have pain that is not relieved with rest or medicine.  You have pain that does not improve in 1 week.  You have new symptoms.  You are generally   not feeling well. SEEK IMMEDIATE MEDICAL CARE IF:   You have pain that radiates from your back into your legs.  You develop new bowel or bladder control problems.  You have unusual weakness or numbness in your arms or legs.  You develop nausea or vomiting.  You develop abdominal pain.  You feel faint. Document Released: 06/13/2005 Document Revised: 12/13/2011 Document Reviewed: 11/01/2010 [employer] Patient Information 2013 [employer].  

## 2012-07-17 NOTE — Progress Notes (Signed)
Subjective:    Patient ID: John Hogan, male    DOB: 02-Oct-1950, 62 y.o.   MRN: [id number]  HPI  Pt presents to the clinic today with c/o back pain. He reports a lifting injury to the area. He was trying to carry his father down the stairs in his wheelchair when he tripped and fell down the stairs. He did strain his back. He did go home and put ice on which did help some. He has had back injuries like this in the past. The pain is worse when he bends or reaches.  Review of Systems      Past Medical History  Diagnosis Date  . Gout   . Hypertension   . Hypertensive cardiomyopathy     EF previously 35%;  echo 02/2009: Mild LVH, EF 60-65%, mild AI, ascending aorta mildly dilated, mild LAE  . H/O exercise stress test     Myoview 01/03/12: No scar or ischemia, EF 53%  . Hyperlipidemia     Current Outpatient Prescriptions  Medication Sig Dispense Refill  . allopurinol (ZYLOPRIM) 300 MG tablet Take 1 tablet (300 mg total) by mouth daily.  90 tablet  1  . amLODipine (NORVASC) 10 MG tablet TAKE 1 TABLET BY MOUTH EVERY DAY  30 tablet  5  . atorvastatin (LIPITOR) 40 MG tablet Take 1 tablet (40 mg total) by mouth daily.  90 tablet  3  . colchicine (COLCRYS) 0.6 MG tablet Take 1 tablet (0.6 mg total) by mouth 2 (two) times daily.  180 tablet  3  . fluocinonide-emollient (LIDEX-E) 0.05 % cream Apply topically 2 (two) times daily.  60 g  2  . hydrOXYzine (VISTARIL) 25 MG capsule Take 1 capsule (25 mg total) by mouth 3 (three) times daily as needed for itching.  50 capsule  2  . cyclobenzaprine (FLEXERIL) 10 MG tablet Take 1 tablet (10 mg total) by mouth 3 (three) times daily as needed for muscle spasms.  30 tablet  0  . diclofenac (VOLTAREN) 75 MG EC tablet Take 1 tablet (75 mg total) by mouth 2 (two) times daily.  30 tablet  0    No Known Allergies  Family History  Problem Relation Age of Onset  . Hypertension Father   . Colon cancer Father 61  . Hyperlipidemia Father   . Heart disease  Father   . Diabetes Father   . Stroke Mother     deceased  . Colon cancer Mother 6  . Arthritis Mother   . Hyperlipidemia Mother   . Hypertension Mother   . Diabetes Mother   . COPD Neg Hx   . Alcohol abuse Neg Hx   . Kidney disease Neg Hx   . Esophageal cancer Neg Hx   . Rectal cancer Neg Hx   . Stomach cancer Neg Hx     History   Social History  . Marital Status: Married    Spouse Name: N/A    Number of Children: 2  . Years of Education: N/A   Occupational History  .  [employer]   Social History Main Topics  . Smoking status: Former Smoker -- 1.0 packs/day for 2 years    Types: Cigarettes    Quit date: 01/10/1975  . Smokeless tobacco: Never Used     Comment: Only smoked two years  . Alcohol Use: No  . Drug Use: No  . Sexually Active: Yes   Other Topics Concern  . Not on file   Social History Narrative  Lives at home with wife.     Constitutional: Denies fever, malaise, fatigue, headache or abrupt weight changes. . Musculoskeletal: Pt reports back pain. Denies decrease in range of motion, difficulty with gait, muscle pain or joint pain and swelling.  Neurological: Denies dizziness, difficulty with memory, difficulty with speech or problems with balance and coordination.   No other specific complaints in a complete review of systems (except as listed in HPI above).  Objective:   Physical Exam   BP 130/88  Pulse 60  Temp 97.2 F (36.2 C) (Oral)  Wt 228 lb 6.4 oz (103.602 kg)  SpO2 95% Wt Readings from Last 3 Encounters:  07/17/12 228 lb 6.4 oz (103.602 kg)  06/15/12 223 lb (101.152 kg)  05/16/12 220 lb 8 oz (100.018 kg)    General: Appears his stated age, well developed, well nourished in NAD.  Cardiovascular: Normal rate and rhythm. S1,S2 noted.  No murmur, rubs or gallops noted. No JVD or BLE edema. No carotid bruits noted. Pulmonary/Chest: Normal effort and positive vesicular breath sounds. No respiratory distress. No wheezes, rales or  ronchi noted. . Musculoskeletal: Pinpoint tenderness on the left lower back. Normal range of motion. No signs of joint swelling. No difficulty with gait.  Neurological: Alert and oriented. Cranial nerves II-XII intact. Coordination normal. +DTRs bilaterally.       Assessment & Plan:  Muscle strain of back, new onset with additional workup required:  eRx for flexeril eRx for diclofenac Perform stretching exercises as indicated on handout  RTC as needed or if symptoms persist

## 2012-08-16 ENCOUNTER — Ambulatory Visit (INDEPENDENT_AMBULATORY_CARE_PROVIDER_SITE_OTHER): Payer: Managed Care, Other (non HMO) | Admitting: Cardiology

## 2012-08-16 ENCOUNTER — Encounter: Payer: Self-pay | Admitting: Cardiology

## 2012-08-16 VITALS — BP 140/92 | HR 70 | Ht 71.0 in | Wt 226.8 lb

## 2012-08-16 DIAGNOSIS — I5042 Chronic combined systolic (congestive) and diastolic (congestive) heart failure: Secondary | ICD-10-CM

## 2012-08-16 DIAGNOSIS — I359 Nonrheumatic aortic valve disorder, unspecified: Secondary | ICD-10-CM

## 2012-08-16 MED ORDER — TADALAFIL 5 MG PO TABS: 10.0000 mg | ORAL_TABLET | Freq: Every day | ORAL | Status: DC | PRN

## 2012-08-16 NOTE — Patient Instructions (Addendum)
The current medical regimen is effective;  continue present plan and medications. You may use Cialis as needed.  Your physician has requested that you have an echocardiogram in July 2014. Echocardiography is a painless test that uses sound waves to create images of your heart. It provides your doctor with information about the size and shape of your heart and how well your heart's chambers and valves are working. This procedure takes approximately one hour. There are no restrictions for this procedure.  Follow up in 1 year with Dr Percival Spanish.  You will receive a letter in the mail 2 months before you are due.  Please call us when you receive this letter to schedule your follow up appointment.

## 2012-08-16 NOTE — Progress Notes (Signed)
HPI The patient presents for followup of aortic insufficiency. He did have some dyspnea last summer. He had a stress test which demonstrated no perfusion defects. He has had previously reduced ejection fraction was most recent echoes demonstrate a well preserved ejection fraction with moderate aortic insufficiency. He's had no new symptoms. The patient denies any new symptoms such as chest discomfort, neck or arm discomfort. There has been no new shortness of breath, PND or orthopnea. There have been no reported palpitations, presyncope or syncope.  No Known Allergies  Current Outpatient Prescriptions  Medication Sig Dispense Refill  . allopurinol (ZYLOPRIM) 300 MG tablet Take 1 tablet (300 mg total) by mouth daily.  90 tablet  1  . amLODipine (NORVASC) 10 MG tablet TAKE 1 TABLET BY MOUTH EVERY DAY  30 tablet  5  . atorvastatin (LIPITOR) 40 MG tablet Take 1 tablet (40 mg total) by mouth daily.  90 tablet  3  . colchicine 0.6 MG tablet Take 0.6 mg by mouth 2 (two) times daily as needed.      . cyclobenzaprine (FLEXERIL) 10 MG tablet Take 1 tablet (10 mg total) by mouth 3 (three) times daily as needed for muscle spasms.  30 tablet  0  . fluocinonide-emollient (LIDEX-E) 0.05 % cream Apply topically 2 (two) times daily as needed.      . hydrOXYzine (VISTARIL) 25 MG capsule Take 1 capsule (25 mg total) by mouth 3 (three) times daily as needed for itching.  50 capsule  2   No current facility-administered medications for this visit.    Past Medical History  Diagnosis Date  . Gout   . Hypertension   . Hypertensive cardiomyopathy     EF previously 35%;  echo 02/2009: Mild LVH, EF 60-65%, mild AI, ascending aorta mildly dilated, mild LAE  . H/O exercise stress test     Myoview 01/03/12: No scar or ischemia, EF 53%  . Hyperlipidemia     Past Surgical History  Procedure Laterality Date  . Wrist surgery      right  . Hernia repair      Umbilical  . Colonoscopy      ROS:  Positive for  erectile dysfunction and insomnia. Otherwise as stated in the HPI and negative for all other systems.  PHYSICAL EXAM BP 140/92  Pulse 70  Ht 5\' 11"  (1.803 m)  Wt 226 lb 12.8 oz (102.876 kg)  BMI 31.65 kg/m2 GENERAL:  Well appearing HEENT:  Pupils equal round and reactive, fundi not visualized, oral mucosa unremarkable, dentures NECK:  No jugular venous distention, waveform within normal limits, carotid upstroke brisk and symmetric, no bruits, no thyromegaly LYMPHATICS:  No cervical, inguinal adenopathy LUNGS:  Clear to auscultation bilaterally BACK:  No CVA tenderness CHEST:  Unremarkable HEART:  PMI not displaced or sustained,S1 and S2 within normal limits, no S3, no S4, no clicks, no rubs, 2/6 diastolic murmur heard best at the left sternal border midsternal during full expiration, no diastolic murmurs ABD:  Flat, positive bowel sounds normal in frequency in pitch, no bruits, no rebound, no guarding, no midline pulsatile mass, no hepatomegaly, no splenomegaly EXT:  2 plus pulses throughout, no edema, no cyanosis no clubbing SKIN:  No rashes no nodules NEURO:  Cranial nerves II through XII grossly intact, motor grossly intact throughout PSYCH:  Cognitively intact, oriented to person place and time   EKG:  Sinus rhythm, rate 70, left axis deviation, left anterior fascicular block, early transition in lead V2, no acute ST-T  wave changes. 08/16/2012  ASSESSMENT AND PLAN  Aortic insufficiency - This has been moderate and will be reassessed with an echo in July.  ED - I see no contraindication to treating with Cialis and I will give him a one-time prescription for this though it should be filled by his primary provider thereafter.  HTN - He took himself off of beta blocker because of bradycardia arrhythmia. His blood pressure is at target. We discussed therapeutic lifestyle changes such as weight loss. He should keep an eye on his blood pressure.  Cardiomyopathy - He no longer carries  this diagnosis as his EF has been well preserved. No change in therapy is indicated.

## 2012-09-17 ENCOUNTER — Other Ambulatory Visit: Payer: Self-pay | Admitting: Internal Medicine

## 2012-11-09 ENCOUNTER — Ambulatory Visit: Payer: Managed Care, Other (non HMO) | Admitting: Internal Medicine

## 2012-11-09 DIAGNOSIS — Z0289 Encounter for other administrative examinations: Secondary | ICD-10-CM

## 2012-11-12 ENCOUNTER — Ambulatory Visit (INDEPENDENT_AMBULATORY_CARE_PROVIDER_SITE_OTHER): Payer: Managed Care, Other (non HMO) | Admitting: Internal Medicine

## 2012-11-12 ENCOUNTER — Encounter: Payer: Self-pay | Admitting: Internal Medicine

## 2012-11-12 ENCOUNTER — Telehealth: Payer: Self-pay | Admitting: *Deleted

## 2012-11-12 VITALS — BP 122/82 | HR 55 | Temp 98.8°F | Ht 71.0 in | Wt 225.0 lb

## 2012-11-12 DIAGNOSIS — M6740 Ganglion, unspecified site: Secondary | ICD-10-CM

## 2012-11-12 DIAGNOSIS — M129 Arthropathy, unspecified: Secondary | ICD-10-CM

## 2012-11-12 DIAGNOSIS — IMO0002 Reserved for concepts with insufficient information to code with codable children: Secondary | ICD-10-CM

## 2012-11-12 DIAGNOSIS — M1990 Unspecified osteoarthritis, unspecified site: Secondary | ICD-10-CM

## 2012-11-12 MED ORDER — TRAMADOL HCL 50 MG PO TABS: 50.0000 mg | ORAL_TABLET | Freq: Two times a day (BID) | ORAL | Status: DC

## 2012-11-12 NOTE — Addendum Note (Signed)
Addended by: Jearld Fenton on: 11/12/2012 03:07 PM   Modules accepted: Orders

## 2012-11-12 NOTE — Telephone Encounter (Signed)
Message copied by Legrand Como on Mon Nov 12, 2012  3:17 PM ------      Message from: Jearld Fenton      Created: Mon Nov 12, 2012  3:07 PM      Regarding: call pt       Ash,      Can you please call mr. Tegtmeyer and let him know that I do not want him to take the celebrex samples I gave his secondary to his heart condition. I have called him in tramadol BID which is a little bit stronger. The celebrex can negatively affect his heart and I would prefer he take the tramadol.      Regina ------

## 2012-11-12 NOTE — Progress Notes (Addendum)
Subjective:    Patient ID: John Hogan, male    DOB: 07-23-1950, 62 y.o.   MRN: [id number]  HPI  Pt presents to the clinic today with c/o a knot on his hand. He noticed this about 2 months ago. It is not painful. It has gotten bigger in size. He has not noticed any redness or drainage from the area. He has not taken anything for it. Additionally, he c/o arthritis pain in his back and hands. The pain is a constant 6-7/10. This has been a chronic issue for him. He does not routinely take any pain medication for this. He does occasionally take Aleve which doesn't seem to help. He is wondering if there is anything else he can take.   Review of Systems      Past Medical History  Diagnosis Date  . Gout   . Hypertension   . Hypertensive cardiomyopathy     EF previously 35%;  echo 02/2009: Mild LVH, EF 60-65%, mild AI, ascending aorta mildly dilated, mild LAE  . H/O exercise stress test     Myoview 01/03/12: No scar or ischemia, EF 53%  . Hyperlipidemia     Current Outpatient Prescriptions  Medication Sig Dispense Refill  . allopurinol (ZYLOPRIM) 300 MG tablet Take 1 tablet (300 mg total) by mouth daily.  90 tablet  1  . amLODipine (NORVASC) 10 MG tablet TAKE 1 TABLET BY MOUTH EVERY DAY  90 tablet  1  . atorvastatin (LIPITOR) 40 MG tablet Take 1 tablet (40 mg total) by mouth daily.  90 tablet  3  . colchicine 0.6 MG tablet Take 0.6 mg by mouth 2 (two) times daily as needed.      . cyclobenzaprine (FLEXERIL) 10 MG tablet Take 1 tablet (10 mg total) by mouth 3 (three) times daily as needed for muscle spasms.  30 tablet  0  . fluocinonide-emollient (LIDEX-E) 0.05 % cream Apply topically 2 (two) times daily as needed.      . hydrOXYzine (VISTARIL) 25 MG capsule Take 1 capsule (25 mg total) by mouth 3 (three) times daily as needed for itching.  50 capsule  2  . tadalafil (CIALIS) 5 MG tablet Take 2 tablets (10 mg total) by mouth daily as needed for erectile dysfunction.  30 tablet  1   No  current facility-administered medications for this visit.    No Known Allergies  Family History  Problem Relation Age of Onset  . Hypertension Father   . Colon cancer Father 80  . Hyperlipidemia Father   . Heart disease Father   . Diabetes Father   . Stroke Mother     deceased  . Colon cancer Mother 55  . Arthritis Mother   . Hyperlipidemia Mother   . Hypertension Mother   . Diabetes Mother   . COPD Neg Hx   . Alcohol abuse Neg Hx   . Kidney disease Neg Hx   . Esophageal cancer Neg Hx   . Rectal cancer Neg Hx   . Stomach cancer Neg Hx     History   Social History  . Marital Status: Married    Spouse Name: N/A    Number of Children: 2  . Years of Education: N/A   Occupational History  .  [employer]   Social History Main Topics  . Smoking status: Former Smoker -- 1.00 packs/day for 2 years    Types: Cigarettes    Quit date: 01/10/1975  . Smokeless tobacco: Never Used  Comment: Only smoked two years  . Alcohol Use: No  . Drug Use: No  . Sexually Active: Yes   Other Topics Concern  . Not on file   Social History Narrative   Lives at home with wife.     Constitutional: Denies fever, malaise, fatigue, headache or abrupt weight changes.  Musculoskeletal: Pt reports knot on his hand and arthritis pain in hands and back. Denies decrease in range of motion, difficulty with gait, muscle pain or joint pain and swelling.  Skin: Denies redness, rashes, lesions or ulcercations.     No other specific complaints in a complete review of systems (except as listed in HPI above).  Objective:   Physical Exam   BP 122/82  Pulse 55  Temp(Src) 98.8 F (37.1 C) (Oral)  Ht 5\' 11"  (1.803 m)  Wt 225 lb (102.059 kg)  BMI 31.39 kg/m2  SpO2 95% Wt Readings from Last 3 Encounters:  11/12/12 225 lb (102.059 kg)  08/16/12 226 lb 12.8 oz (102.876 kg)  07/17/12 228 lb 6.4 oz (103.602 kg)    General: Appears his stated age, well developed, well nourished in NAD.   Cardiovascular: Normal rate and rhythm. S1,S2 noted.  No murmur, rubs or gallops noted. No JVD or BLE edema. No carotid bruits noted. Pulmonary/Chest: Normal effort and positive vesicular breath sounds. No respiratory distress. No wheezes, rales or ronchi noted. . Musculoskeletal: Decreased range of motion secondary to stifness. No signs of joint swelling. No difficulty with gait. Small epidermal cyst of left hand noted. Not infected.       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

## 2012-11-12 NOTE — Telephone Encounter (Signed)
Pt informed of rx for Tramadol and not to take Celebrex samples.

## 2012-11-12 NOTE — Patient Instructions (Signed)
Epidermal Cyst An epidermal cyst is sometimes called a sebaceous cyst, epidermal inclusion cyst, or infundibular cyst. These cysts usually contain a substance that looks "pasty" or "cheesy" and may have a bad smell. This substance is a protein called keratin. Epidermal cysts are usually found on the face, neck, or trunk. They may also occur in the vaginal area or other parts of the genitalia of both men and women. Epidermal cysts are usually small, painless, slow-growing bumps or lumps that move freely under the skin. It is important not to try to pop them. This may cause an infection and lead to tenderness and swelling. CAUSES  Epidermal cysts may be caused by a deep penetrating injury to the skin or a plugged hair follicle, often associated with acne. SYMPTOMS  Epidermal cysts can become inflamed and cause:  Redness.  Tenderness.  Increased temperature of the skin over the bumps or lumps.  Grayish-white, bad smelling material that drains from the bump or lump. DIAGNOSIS  Epidermal cysts are easily diagnosed by your caregiver during an exam. Rarely, a tissue sample (biopsy) may be taken to rule out other conditions that may resemble epidermal cysts. TREATMENT   Epidermal cysts often get better and disappear on their own. They are rarely ever cancerous.  If a cyst becomes infected, it may become inflamed and tender. This may require opening and draining the cyst. Treatment with antibiotics may be necessary. When the infection is gone, the cyst may be removed with minor surgery.  Small, inflamed cysts can often be treated with antibiotics or by injecting steroid medicines.  Sometimes, epidermal cysts become large and bothersome. If this happens, surgical removal in your caregiver's office may be necessary. HOME CARE INSTRUCTIONS  Only take over-the-counter or prescription medicines as directed by your caregiver.  Take your antibiotics as directed. Finish them even if you start to feel  better. SEEK MEDICAL CARE IF:   Your cyst becomes tender, red, or swollen.  Your condition is not improving or is getting worse.  You have any other questions or concerns. MAKE SURE YOU:  Understand these instructions.  Will watch your condition.  Will get help right away if you are not doing well or get worse. Document Released: 05/14/2004 Document Revised: 09/05/2011 Document Reviewed: 12/20/2010 [facility] Patient Information 2013 [place].

## 2012-12-26 ENCOUNTER — Other Ambulatory Visit: Payer: Self-pay | Admitting: Internal Medicine

## 2012-12-31 ENCOUNTER — Ambulatory Visit (HOSPITAL_COMMUNITY): Payer: Managed Care, Other (non HMO) | Attending: Cardiology

## 2012-12-31 ENCOUNTER — Other Ambulatory Visit (HOSPITAL_COMMUNITY): Payer: Managed Care, Other (non HMO)

## 2012-12-31 DIAGNOSIS — I509 Heart failure, unspecified: Secondary | ICD-10-CM | POA: Insufficient documentation

## 2012-12-31 DIAGNOSIS — I359 Nonrheumatic aortic valve disorder, unspecified: Secondary | ICD-10-CM | POA: Insufficient documentation

## 2012-12-31 DIAGNOSIS — I10 Essential (primary) hypertension: Secondary | ICD-10-CM | POA: Insufficient documentation

## 2012-12-31 DIAGNOSIS — I5042 Chronic combined systolic (congestive) and diastolic (congestive) heart failure: Secondary | ICD-10-CM

## 2012-12-31 NOTE — Progress Notes (Signed)
Echocardiogram performed.  

## 2013-02-27 ENCOUNTER — Encounter: Payer: Self-pay | Admitting: Internal Medicine

## 2013-02-27 ENCOUNTER — Ambulatory Visit (INDEPENDENT_AMBULATORY_CARE_PROVIDER_SITE_OTHER): Payer: Managed Care, Other (non HMO) | Admitting: Internal Medicine

## 2013-02-27 ENCOUNTER — Other Ambulatory Visit (INDEPENDENT_AMBULATORY_CARE_PROVIDER_SITE_OTHER): Payer: Managed Care, Other (non HMO)

## 2013-02-27 VITALS — BP 130/84 | HR 56 | Temp 98.6°F | Resp 16 | Wt 229.0 lb

## 2013-02-27 DIAGNOSIS — E7800 Pure hypercholesterolemia, unspecified: Secondary | ICD-10-CM

## 2013-02-27 DIAGNOSIS — N181 Chronic kidney disease, stage 1: Secondary | ICD-10-CM

## 2013-02-27 DIAGNOSIS — I10 Essential (primary) hypertension: Secondary | ICD-10-CM

## 2013-02-27 DIAGNOSIS — Z23 Encounter for immunization: Secondary | ICD-10-CM | POA: Insufficient documentation

## 2013-02-27 DIAGNOSIS — Z0000 Encounter for general adult medical examination without abnormal findings: Secondary | ICD-10-CM

## 2013-02-27 DIAGNOSIS — I351 Nonrheumatic aortic (valve) insufficiency: Secondary | ICD-10-CM

## 2013-02-27 DIAGNOSIS — N529 Male erectile dysfunction, unspecified: Secondary | ICD-10-CM | POA: Insufficient documentation

## 2013-02-27 DIAGNOSIS — M109 Gout, unspecified: Secondary | ICD-10-CM

## 2013-02-27 DIAGNOSIS — I359 Nonrheumatic aortic valve disorder, unspecified: Secondary | ICD-10-CM

## 2013-02-27 LAB — CBC WITH DIFFERENTIAL/PLATELET
Basophils Relative: 0.9 % (ref 0.0–3.0)
Eosinophils Absolute: 0.2 10*3/uL (ref 0.0–0.7)
Hemoglobin: 13.9 g/dL (ref 13.0–17.0)
Lymphs Abs: 1.6 10*3/uL (ref 0.7–4.0)
MCHC: 34.2 g/dL (ref 30.0–36.0)
MCV: 90.3 fl (ref 78.0–100.0)
Monocytes Absolute: 0.3 10*3/uL (ref 0.1–1.0)
Neutro Abs: 1.2 10*3/uL — ABNORMAL LOW (ref 1.4–7.7)
RBC: 4.49 Mil/uL (ref 4.22–5.81)

## 2013-02-27 LAB — COMPREHENSIVE METABOLIC PANEL
AST: 23 U/L (ref 0–37)
Alkaline Phosphatase: 79 U/L (ref 39–117)
BUN: 14 mg/dL (ref 6–23)
Calcium: 9.6 mg/dL (ref 8.4–10.5)
Creatinine, Ser: 1.4 mg/dL (ref 0.4–1.5)

## 2013-02-27 LAB — URINALYSIS, ROUTINE W REFLEX MICROSCOPIC
Bilirubin Urine: NEGATIVE
Leukocytes, UA: NEGATIVE
Nitrite: NEGATIVE
Specific Gravity, Urine: 1.02 (ref 1.000–1.030)
pH: 6 (ref 5.0–8.0)

## 2013-02-27 LAB — FECAL OCCULT BLOOD, GUAIAC: Fecal Occult Blood: NEGATIVE

## 2013-02-27 LAB — URIC ACID: Uric Acid, Serum: 4.9 mg/dL (ref 4.0–7.8)

## 2013-02-27 MED ORDER — SILDENAFIL CITRATE 50 MG PO TABS: 50.0000 mg | ORAL_TABLET | Freq: Every day | ORAL | Status: DC | PRN

## 2013-02-27 MED ORDER — AMLODIPINE BESYLATE 10 MG PO TABS: 10.0000 mg | ORAL_TABLET | Freq: Every day | ORAL | Status: DC

## 2013-02-27 NOTE — Assessment & Plan Note (Signed)
Will change cialis to viagra at this request

## 2013-02-27 NOTE — Assessment & Plan Note (Signed)
His BP is well controlled 

## 2013-02-27 NOTE — Assessment & Plan Note (Signed)
I will recheck his renal function today He will avoid nephrotoxic agents

## 2013-02-27 NOTE — Progress Notes (Signed)
Subjective:    Patient ID: John Hogan, male    DOB: Jul 12, 1950, 62 y.o.   MRN: [id number]  Hypertension This is a chronic problem. The current episode started more than 1 year ago. The problem is unchanged. The problem is controlled. Pertinent negatives include no anxiety, blurred vision, chest pain, headaches, malaise/fatigue, neck pain, orthopnea, palpitations, peripheral edema, PND, shortness of breath or sweats. Past treatments include calcium channel blockers. The current treatment provides moderate improvement. Compliance problems include exercise and diet.  Hypertensive end-organ damage includes left ventricular hypertrophy.      Review of Systems  Constitutional: Negative.  Negative for fever, chills, malaise/fatigue, diaphoresis, activity change, appetite change, fatigue and unexpected weight change.  HENT: Negative.  Negative for neck pain.   Eyes: Negative.  Negative for blurred vision.  Respiratory: Negative.  Negative for cough, choking, chest tightness, shortness of breath, wheezing and stridor.   Cardiovascular: Negative.  Negative for chest pain, palpitations, orthopnea, leg swelling and PND.  Gastrointestinal: Negative.  Negative for nausea, vomiting, abdominal pain, diarrhea and constipation.  Endocrine: Negative.   Genitourinary: Negative.   Musculoskeletal: Negative.   Skin: Negative.   Allergic/Immunologic: Negative.   Neurological: Negative.  Negative for dizziness, syncope, facial asymmetry, speech difficulty, weakness, light-headedness and headaches.  Hematological: Negative.  Negative for adenopathy. Does not bruise/bleed easily.  Psychiatric/Behavioral: Negative.        Objective:   Physical Exam  Vitals reviewed. Constitutional: He is oriented to person, place, and time. He appears well-developed and well-nourished. No distress.  HENT:  Head: Normocephalic and atraumatic.  Mouth/Throat: Oropharynx is clear and moist. No oropharyngeal exudate.  Eyes:  Conjunctivae are normal. Right eye exhibits no discharge. Left eye exhibits no discharge. No scleral icterus.  Neck: Normal range of motion. Neck supple. No JVD present. No tracheal deviation present. No thyromegaly present.  Cardiovascular: Normal rate, regular rhythm, S1 normal, S2 normal and intact distal pulses.  Exam reveals no gallop, no S3, no S4 and no friction rub.   Murmur heard.  No systolic murmur is present   Decrescendo diastolic murmur is present with a grade of 1/6  Pulses:      Carotid pulses are 1+ on the right side, and 1+ on the left side.      Radial pulses are 1+ on the right side, and 1+ on the left side.       Femoral pulses are 1+ on the right side, and 1+ on the left side.      Popliteal pulses are 1+ on the right side, and 1+ on the left side.       Dorsalis pedis pulses are 1+ on the right side, and 1+ on the left side.       Posterior tibial pulses are 1+ on the right side, and 1+ on the left side.  Pulmonary/Chest: Effort normal and breath sounds normal. No stridor. No respiratory distress. He has no wheezes. He has no rales. He exhibits no tenderness.  Abdominal: Soft. Bowel sounds are normal. He exhibits no distension and no mass. There is no tenderness. There is no rebound and no guarding. Hernia confirmed negative in the right inguinal area and confirmed negative in the left inguinal area.  Genitourinary: Rectum normal, prostate normal, testes normal and penis normal. Rectal exam shows no external hemorrhoid, no internal hemorrhoid, no fissure, no mass, no tenderness and anal tone normal. Guaiac negative stool. Prostate is not enlarged and not tender. Right testis shows no mass, no swelling  and no tenderness. Right testis is descended. Left testis shows no mass, no swelling and no tenderness. Left testis is descended. Uncircumcised. No phimosis, paraphimosis, hypospadias, penile erythema or penile tenderness. No discharge found.  Musculoskeletal: Normal range of  motion. He exhibits no edema and no tenderness.  Lymphadenopathy:    He has no cervical adenopathy.       Right: No inguinal adenopathy present.       Left: No inguinal adenopathy present.  Neurological: He is oriented to person, place, and time.  Skin: Skin is warm and dry. No rash noted. He is not diaphoretic. No erythema. No pallor.  Psychiatric: He has a normal mood and affect. His behavior is normal. Judgment and thought content normal.     Lab Results  Component Value Date   WBC 3.3* 11/27/2011   HGB 13.9 11/27/2011   HCT 41.1 11/27/2011   PLT 182 11/27/2011   GLUCOSE 101* 06/15/2012   CHOL 205* 02/16/2012   TRIG 53.0 02/16/2012   HDL 70.30 02/16/2012   LDLDIRECT 119.5 02/16/2012   ALT 17 01/18/2012   AST 16 01/18/2012   NA 139 06/15/2012   K 4.2 06/15/2012   CL 103 06/15/2012   CREATININE 1.4 06/15/2012   BUN 20 06/15/2012   CO2 32 06/15/2012   TSH 1.58 02/16/2012   PSA 2.11 02/16/2012       Assessment & Plan:

## 2013-02-27 NOTE — Assessment & Plan Note (Signed)
This appears to be stable 

## 2013-02-27 NOTE — Assessment & Plan Note (Signed)
Exam done  vaccines were updated Labs ordered Pt ed material was given

## 2013-02-27 NOTE — Assessment & Plan Note (Signed)
FLP today 

## 2013-02-27 NOTE — Patient Instructions (Signed)
Health Maintenance, Males A healthy lifestyle and preventative care can promote health and wellness.  Maintain regular health, dental, and eye exams.  Eat a healthy diet. Foods like vegetables, fruits, whole grains, low-fat dairy products, and lean protein foods contain the nutrients you need without too many calories. Decrease your intake of foods high in solid fats, added sugars, and salt. Get information about a proper diet from your caregiver, if necessary.  Regular physical exercise is one of the most important things you can do for your health. Most adults should get at least 150 minutes of moderate-intensity exercise (any activity that increases your heart rate and causes you to sweat) each week. In addition, most adults need muscle-strengthening exercises on 2 or more days a week.   Maintain a healthy weight. The body mass index (BMI) is a screening tool to identify possible weight problems. It provides an estimate of body fat based on height and weight. Your caregiver can help determine your BMI, and can help you achieve or maintain a healthy weight. For adults 20 years and older:  A BMI below 18.5 is considered underweight.  A BMI of 18.5 to 24.9 is normal.  A BMI of 25 to 29.9 is considered overweight.  A BMI of 30 and above is considered obese.  Maintain normal blood lipids and cholesterol by exercising and minimizing your intake of saturated fat. Eat a balanced diet with plenty of fruits and vegetables. Blood tests for lipids and cholesterol should begin at age 20 and be repeated every 5 years. If your lipid or cholesterol levels are high, you are over 50, or you are a high risk for heart disease, you may need your cholesterol levels checked more frequently.Ongoing high lipid and cholesterol levels should be treated with medicines, if diet and exercise are not effective.  If you smoke, find out from your caregiver how to quit. If you do not use tobacco, do not start.  If you  choose to drink alcohol, do not exceed 2 drinks per day. One drink is considered to be 12 ounces (355 mL) of beer, 5 ounces (148 mL) of wine, or 1.5 ounces (44 mL) of liquor.  Avoid use of street drugs. Do not share needles with anyone. Ask for help if you need support or instructions about stopping the use of drugs.  High blood pressure causes heart disease and increases the risk of stroke. Blood pressure should be checked at least every 1 to 2 years. Ongoing high blood pressure should be treated with medicines if weight loss and exercise are not effective.  If you are 45 to 63 years old, ask your caregiver if you should take aspirin to prevent heart disease.  Diabetes screening involves taking a blood sample to check your fasting blood sugar level. This should be done once every 3 years, after age 45, if you are within normal weight and without risk factors for diabetes. Testing should be considered at a younger age or be carried out more frequently if you are overweight and have at least 1 risk factor for diabetes.  Colorectal cancer can be detected and often prevented. Most routine colorectal cancer screening begins at the age of 50 and continues through age 75. However, your caregiver may recommend screening at an earlier age if you have risk factors for colon cancer. On a yearly basis, your caregiver may provide home test kits to check for hidden blood in the stool. Use of a small camera at the end of a tube,   to directly examine the colon (sigmoidoscopy or colonoscopy), can detect the earliest forms of colorectal cancer. Talk to your caregiver about this at age 50, when routine screening begins. Direct examination of the colon should be repeated every 5 to 10 years through age 75, unless early forms of pre-cancerous polyps or small growths are found.  Hepatitis C blood testing is recommended for all people born from 1945 through 1965 and any individual with known risks for hepatitis C.  Healthy  men should no longer receive prostate-specific antigen (PSA) blood tests as part of routine cancer screening. Consult with your caregiver about prostate cancer screening.  Testicular cancer screening is not recommended for adolescents or adult males who have no symptoms. Screening includes self-exam, caregiver exam, and other screening tests. Consult with your caregiver about any symptoms you have or any concerns you have about testicular cancer.  Practice safe sex. Use condoms and avoid high-risk sexual practices to reduce the spread of sexually transmitted infections (STIs).  Use sunscreen with a sun protection factor (SPF) of 30 or greater. Apply sunscreen liberally and repeatedly throughout the day. You should seek shade when your shadow is shorter than you. Protect yourself by wearing long sleeves, pants, a wide-brimmed hat, and sunglasses year round, whenever you are outdoors.  Notify your caregiver of new moles or changes in moles, especially if there is a change in shape or color. Also notify your caregiver if a mole is larger than the size of a pencil eraser.  A one-time screening for abdominal aortic aneurysm (AAA) and surgical repair of large AAAs by sound wave imaging (ultrasonography) is recommended for ages 65 to 75 years who are current or former smokers.  Stay current with your immunizations. Document Released: 12/10/2007 Document Revised: 09/05/2011 Document Reviewed: 11/08/2010 [employer] Patient Information 2014 [employer].  

## 2013-03-01 ENCOUNTER — Telehealth: Payer: Self-pay

## 2013-03-01 NOTE — Telephone Encounter (Signed)
Received pharmacy rejection stating that insurance will not cover viagra without a prior authorization. Filiberto Pinks at [phone] spoke with Elmyra Ricks and PA approved 03/01/13-03/01/14. Pharmacy notified via returned fax.

## 2013-05-27 LAB — HM DIABETES EYE EXAM

## 2013-05-31 ENCOUNTER — Encounter: Payer: Self-pay | Admitting: Nurse Practitioner

## 2013-05-31 ENCOUNTER — Ambulatory Visit (INDEPENDENT_AMBULATORY_CARE_PROVIDER_SITE_OTHER): Payer: Managed Care, Other (non HMO) | Admitting: Nurse Practitioner

## 2013-05-31 VITALS — BP 130/80 | HR 57 | Temp 97.8°F | Ht 71.0 in | Wt 240.0 lb

## 2013-05-31 DIAGNOSIS — S39012A Strain of muscle, fascia and tendon of lower back, initial encounter: Secondary | ICD-10-CM

## 2013-05-31 DIAGNOSIS — IMO0002 Reserved for concepts with insufficient information to code with codable children: Secondary | ICD-10-CM

## 2013-05-31 MED ORDER — METHOCARBAMOL 500 MG PO TABS
500.0000 mg | ORAL_TABLET | Freq: Three times a day (TID) | ORAL | Status: DC | PRN
Start: 2013-05-31 — End: 2014-01-07

## 2013-05-31 NOTE — Progress Notes (Signed)
   Subjective:    Patient ID: John Hogan, male    DOB: 01/15/1951, 62 y.o.   MRN: [id number]  Muscle Pain This is a new problem. The current episode started in the past 7 days (4da). The problem occurs intermittently. The problem has been waxing and waning since onset. The pain occurs in the context of an injury (helping a friend move a trailer-a lot of pushing & pulling.). The pain is present in the lower back. The pain is medium. Exacerbated by: certain movement, worse after prolonged sitting. Pertinent negatives include no abdominal pain, fatigue, fever, headaches, nausea, sensory change, shortness of breath or weakness. Past treatments include nothing. He has been behaving normally. There is no history of chronic back pain.      Review of Systems  Constitutional: Negative for fever, chills, appetite change and fatigue.  Respiratory: Negative for chest tightness and shortness of breath.   Gastrointestinal: Negative for nausea and abdominal pain.  Genitourinary: Negative for flank pain.  Musculoskeletal: Positive for back pain and myalgias. Negative for gait problem, neck pain and neck stiffness.       No radiation to buttocks or legs  Neurological: Negative for sensory change, weakness and headaches.  Psychiatric/Behavioral: Negative for confusion.       Objective:   Physical Exam  Vitals reviewed. Constitutional: He is oriented to person, place, and time. He appears well-developed and well-nourished. No distress.  HENT:  Head: Normocephalic and atraumatic.  Cardiovascular: Normal rate and regular rhythm.   No murmur heard. Pulmonary/Chest: Effort normal.  Musculoskeletal: He exhibits tenderness (r-sided lumbar tenderness. no spinal tenderness.).  Neurological: He is alert and oriented to person, place, and time.  Normal gait  Skin: Skin is warm and dry.  Psychiatric: He has a normal mood and affect. His behavior is normal. Thought content normal.          Assessment  & Plan:  1. Back strain, initial encounter See pt instructions. - methocarbamol (ROBAXIN) 500 MG tablet; Take 1 tablet (500 mg total) by mouth every 8 (eight) hours as needed for muscle spasms.  Dispense: 20 tablet; Refill: 0

## 2013-05-31 NOTE — Progress Notes (Signed)
Pre-visit discussion using our clinic review tool. No additional management support is needed unless otherwise documented below in the visit note.  

## 2013-05-31 NOTE — Patient Instructions (Signed)
Take muscle relaxer when you will not be driving or operating machinery. Take 500 mg tylenol 2-3 times daily for several days if needed for pain. Use heating pad on lower back for 15-20 minutes several times dAILY. Avoid twisting trunk: golfers lift, let toes lead you when turning. No lifting over 10 pounds for 2 weeks. When carrying objects, hold close to body to decrease strain. Avoid prolonged sitting-move around or do gentle back stretches throughout the day. Please call us if no improvement within 1 week. Feel better.  Lumbosacral Strain Lumbosacral strain is one of the most common causes of back pain. There are many causes of back pain. Most are not serious conditions. CAUSES  Your backbone (spinal column) is made up of 24 main vertebral bodies, the sacrum, and the coccyx. These are held together by muscles and tough, fibrous tissue (ligaments). Nerve roots pass through the openings between the vertebrae. A sudden move or injury to the back may cause injury to, or pressure on, these nerves. This may result in localized back pain or pain movement (radiation) into the buttocks, down the leg, and into the foot. Sharp, shooting pain from the buttock down the back of the leg (sciatica) is frequently associated with a ruptured (herniated) disk. Pain may be caused by muscle spasm alone. Your caregiver can often find the cause of your pain by the details of your symptoms and an exam. In some cases, you may need tests (such as X-rays). Your caregiver will work with you to decide if any tests are needed based on your specific exam. HOME CARE INSTRUCTIONS   Avoid an underactive lifestyle. Active exercise, as directed by your caregiver, is your greatest weapon against back pain.  Avoid hard physical activities (tennis, racquetball, waterskiing) if you are not in proper physical condition for it. This may aggravate or create problems.  If you have a back problem, avoid sports requiring sudden body movements.  Swimming and walking are generally safer activities.  Maintain good posture.  Avoid becoming overweight (obese).  Use bed rest for only the most extreme, sudden (acute) episode. Your caregiver will help you determine how much bed rest is necessary.  For acute conditions, you may put ice on the injured area.  Put ice in a plastic bag.  Place a towel between your skin and the bag.  Leave the ice on for 15-20 minutes at a time, every 2 hours, or as needed.  After you are improved and more active, it may help to apply heat for 30 minutes before activities. See your caregiver if you are having pain that lasts longer than expected. Your caregiver can advise appropriate exercises or therapy if needed. With conditioning, most back problems can be avoided. SEEK IMMEDIATE MEDICAL CARE IF:   You have numbness, tingling, weakness, or problems with the use of your arms or legs.  You experience severe back pain not relieved with medicines.  There is a change in bowel or bladder control.  You have increasing pain in any area of the body, including your belly (abdomen).  You notice shortness of breath, dizziness, or feel faint.  You feel sick to your stomach (nauseous), are throwing up (vomiting), or become sweaty.  You notice discoloration of your toes or legs, or your feet get very cold.  Your back pain is getting worse.  You have a fever. MAKE SURE YOU:   Understand these instructions.  Will watch your condition.  Will get help right away if you are not doing  well or get worse. Document Released: 03/23/2005 Document Revised: 09/05/2011 Document Reviewed: 09/12/2008 [facility] Patient Information 2014 [place], [place].

## 2013-12-18 ENCOUNTER — Other Ambulatory Visit: Payer: Self-pay | Admitting: Internal Medicine

## 2014-01-07 ENCOUNTER — Ambulatory Visit (INDEPENDENT_AMBULATORY_CARE_PROVIDER_SITE_OTHER): Payer: Managed Care, Other (non HMO) | Admitting: Cardiology

## 2014-01-07 ENCOUNTER — Encounter: Payer: Self-pay | Admitting: Cardiology

## 2014-01-07 ENCOUNTER — Telehealth: Payer: Self-pay | Admitting: *Deleted

## 2014-01-07 VITALS — BP 142/88 | HR 70 | Ht 71.0 in | Wt 233.0 lb

## 2014-01-07 DIAGNOSIS — R0989 Other specified symptoms and signs involving the circulatory and respiratory systems: Secondary | ICD-10-CM

## 2014-01-07 DIAGNOSIS — I359 Nonrheumatic aortic valve disorder, unspecified: Secondary | ICD-10-CM

## 2014-01-07 DIAGNOSIS — I351 Nonrheumatic aortic (valve) insufficiency: Secondary | ICD-10-CM

## 2014-01-07 MED ORDER — ZOLPIDEM TARTRATE 5 MG PO TABS: 5.0000 mg | ORAL_TABLET | Freq: Every evening | ORAL | Status: DC | PRN

## 2014-01-07 NOTE — Progress Notes (Signed)
HPI The patient presents for followup of aortic insufficiency. He has had previously reduced ejection fraction but it has been improved a followup. Aortic insufficiency has been moderate.  He presents for routine followup. He says he's been doing relatively well. He does get a little bit of dyspnea with exertion but this has been chronic. He's not had any PND or orthopnea. He denies any chest pressure, neck or arm discomfort. He's not really noticing any palpitations, presyncope or syncope. He's had no significant weight gain and actually has lost a few pounds. He doesn't sleep well.  No Known Allergies  Current Outpatient Prescriptions  Medication Sig Dispense Refill  . allopurinol (ZYLOPRIM) 300 MG tablet TAKE 1 TABLET BY MOUTH DAILY  90 tablet  0  . amLODipine (NORVASC) 10 MG tablet Take 1 tablet (10 mg total) by mouth daily.  90 tablet  3  . colchicine 0.6 MG tablet Take 0.6 mg by mouth 2 (two) times daily as needed.      . dorzolamide-timolol (COSOPT) 22.3-6.8 MG/ML ophthalmic solution       . Travoprost, BAK Free, (TRAVATAN) 0.004 % SOLN ophthalmic solution 1 drop at bedtime.       No current facility-administered medications for this visit.    Past Medical History  Diagnosis Date  . Gout   . Hypertension   . Hypertensive cardiomyopathy     EF previously 35%;  echo 02/2009: Mild LVH, EF 60-65%, mild AI, ascending aorta mildly dilated, mild LAE  . H/O exercise stress test     Myoview 01/03/12: No scar or ischemia, EF 53%  . Hyperlipidemia     Past Surgical History  Procedure Laterality Date  . Wrist surgery      right  . Hernia repair      Umbilical  . Colonoscopy      ROS:  Positive for erectile dysfunction and insomnia. Otherwise as stated in the HPI and negative for all other systems.  PHYSICAL EXAM BP 142/88  Pulse 70  Ht 5\' 11"  (1.803 m)  Wt 233 lb (105.688 kg)  BMI 32.51 kg/m2 GENERAL:  Well appearing HEENT:  Pupils equal round and reactive, fundi not  visualized, oral mucosa unremarkable, dentures NECK:  No jugular venous distention, waveform within normal limits, carotid upstroke brisk and symmetric, no bruits, no thyromegaly LYMPHATICS:  No cervical, inguinal adenopathy LUNGS:  Clear to auscultation bilaterally BACK:  No CVA tenderness CHEST:  Unremarkable HEART:  PMI not displaced or sustained,S1 and S2 within normal limits, no S3, no S4, no clicks, no rubs, 2/6 diastolic murmur heard best at the left sternal border midsternal during full expiration, no diastolic murmurs ABD:  Flat, positive bowel sounds normal in frequency in pitch, no bruits, no rebound, no guarding, no midline pulsatile mass, no hepatomegaly, no splenomegaly EXT:  2 plus pulses throughout, no edema, no cyanosis no clubbing SKIN:  No rashes no nodules NEURO:  Cranial nerves II through XII grossly intact, motor grossly intact throughout PSYCH:  Cognitively intact, oriented to person place and time   EKG:  Sinus rhythm, rate 47, left axis deviation, left anterior fascicular block, early transition in lead V2, no acute ST-T wave changes. 01/07/2014  ASSESSMENT AND PLAN  Aortic insufficiency - This has been moderate and will be reassessed with another echo   HTN - His blood pressure is very slightly elevated. I would like him to lose 5-10 pounds and I think he would be controlled.  Cardiomyopathy - He no longer carries this  diagnosis as his EF has been well preserved. No change in therapy is indicated.  Insomia - I will take the liberty of giving him a one-month supply of Ambien 5 mg. He can then discuss this with his primary provider.  Bruit - I will order a Doppler

## 2014-01-07 NOTE — Patient Instructions (Addendum)
We are scheduling a carotid doppler and an Echo  Take Ambien 5 mg as prescribed

## 2014-01-07 NOTE — Addendum Note (Signed)
Addended by: Dolan Amen on: 01/07/2014 06:03 PM   Modules accepted: Orders

## 2014-01-08 NOTE — Addendum Note (Signed)
Addended by: Dolan Amen on: 01/08/2014 03:16 PM   Modules accepted: Orders

## 2014-01-08 NOTE — Addendum Note (Signed)
Addended by: Vear Clock on: 01/08/2014 02:23 PM   Modules accepted: Orders

## 2014-01-09 NOTE — Telephone Encounter (Signed)
JC made contact with the patient.

## 2014-01-14 ENCOUNTER — Ambulatory Visit (HOSPITAL_BASED_OUTPATIENT_CLINIC_OR_DEPARTMENT_OTHER)
Admission: RE | Admit: 2014-01-14 | Discharge: 2014-01-14 | Disposition: A | Discharge status: Home / Self Care | Payer: Managed Care, Other (non HMO) | Source: Ambulatory Visit | Attending: Cardiology | Admitting: Cardiology

## 2014-01-14 ENCOUNTER — Ambulatory Visit (HOSPITAL_COMMUNITY)
Admission: RE | Admit: 2014-01-14 | Discharge: 2014-01-14 | Disposition: A | Discharge status: Home / Self Care | Payer: Managed Care, Other (non HMO) | Source: Ambulatory Visit | Attending: Cardiovascular Disease | Admitting: Cardiovascular Disease

## 2014-01-14 DIAGNOSIS — I119 Hypertensive heart disease without heart failure: Secondary | ICD-10-CM

## 2014-01-14 DIAGNOSIS — I2589 Other forms of chronic ischemic heart disease: Secondary | ICD-10-CM | POA: Insufficient documentation

## 2014-01-14 DIAGNOSIS — I351 Nonrheumatic aortic (valve) insufficiency: Secondary | ICD-10-CM

## 2014-01-14 DIAGNOSIS — R0989 Other specified symptoms and signs involving the circulatory and respiratory systems: Secondary | ICD-10-CM

## 2014-01-14 DIAGNOSIS — I359 Nonrheumatic aortic valve disorder, unspecified: Secondary | ICD-10-CM

## 2014-01-14 NOTE — Progress Notes (Signed)
Carotid Duplex Completed. °Brianna L Mazza,RVT °

## 2014-01-14 NOTE — Progress Notes (Signed)
2D Echocardiogram Complete.  01/14/2014   Jahir Halt, RDCS  

## 2014-02-05 ENCOUNTER — Telehealth: Payer: Self-pay

## 2014-02-11 ENCOUNTER — Other Ambulatory Visit: Payer: Self-pay

## 2014-02-11 NOTE — Telephone Encounter (Signed)
The instruction that I gave him was that I would give him a one month supply and then he needed to discuss it with Scarlette Calico, MD to see if he wanted to continue it.  We can give him another one month refill on the same dose and instruction.  Thanks.

## 2014-02-12 ENCOUNTER — Telehealth: Payer: Self-pay | Admitting: Internal Medicine

## 2014-02-12 NOTE — Telephone Encounter (Signed)
Pt last seen 02/27/13, will need office visit. Thaks

## 2014-02-12 NOTE — Telephone Encounter (Signed)
Scheduled appt for this Friday at 8am.

## 2014-02-12 NOTE — Telephone Encounter (Signed)
Patient was prescribed zolpidem 5mg  1xday by his cardiologist for sleep.  His cardiologist told him to contact Dr. Ronnald Ramp to get refills.  Please advise.

## 2014-02-14 ENCOUNTER — Other Ambulatory Visit (INDEPENDENT_AMBULATORY_CARE_PROVIDER_SITE_OTHER): Payer: Managed Care, Other (non HMO)

## 2014-02-14 ENCOUNTER — Encounter: Payer: Self-pay | Admitting: Internal Medicine

## 2014-02-14 ENCOUNTER — Ambulatory Visit (INDEPENDENT_AMBULATORY_CARE_PROVIDER_SITE_OTHER): Payer: Managed Care, Other (non HMO) | Admitting: Internal Medicine

## 2014-02-14 VITALS — BP 124/80 | HR 51 | Temp 98.1°F | Resp 16 | Ht 71.0 in | Wt 228.0 lb

## 2014-02-14 DIAGNOSIS — M1009 Idiopathic gout, multiple sites: Secondary | ICD-10-CM

## 2014-02-14 DIAGNOSIS — M109 Gout, unspecified: Secondary | ICD-10-CM

## 2014-02-14 DIAGNOSIS — N181 Chronic kidney disease, stage 1: Secondary | ICD-10-CM

## 2014-02-14 DIAGNOSIS — I10 Essential (primary) hypertension: Secondary | ICD-10-CM

## 2014-02-14 DIAGNOSIS — N2889 Other specified disorders of kidney and ureter: Secondary | ICD-10-CM

## 2014-02-14 DIAGNOSIS — I119 Hypertensive heart disease without heart failure: Secondary | ICD-10-CM

## 2014-02-14 DIAGNOSIS — Z23 Encounter for immunization: Secondary | ICD-10-CM

## 2014-02-14 DIAGNOSIS — G4700 Insomnia, unspecified: Secondary | ICD-10-CM

## 2014-02-14 DIAGNOSIS — E785 Hyperlipidemia, unspecified: Secondary | ICD-10-CM

## 2014-02-14 DIAGNOSIS — I5042 Chronic combined systolic (congestive) and diastolic (congestive) heart failure: Secondary | ICD-10-CM

## 2014-02-14 LAB — BASIC METABOLIC PANEL
BUN: 17 mg/dL (ref 6–23)
CO2: 27 meq/L (ref 19–32)
CREATININE: 1.4 mg/dL (ref 0.4–1.5)
Calcium: 9.4 mg/dL (ref 8.4–10.5)
Chloride: 104 mEq/L (ref 96–112)
GFR: 63.45 mL/min (ref >60.00)
Glucose, Bld: 99 mg/dL (ref 70–99)
POTASSIUM: 3.8 meq/L (ref 3.5–5.1)
Sodium: 139 mEq/L (ref 135–145)

## 2014-02-14 LAB — URIC ACID: URIC ACID, SERUM: 4.8 mg/dL (ref 4.0–7.8)

## 2014-02-14 MED ORDER — PRAVASTATIN SODIUM 20 MG PO TABS: 20.0000 mg | ORAL_TABLET | Freq: Every day | ORAL | Status: DC

## 2014-02-14 MED ORDER — ZOLPIDEM TARTRATE 5 MG PO TABS: 5.0000 mg | ORAL_TABLET | Freq: Every evening | ORAL | Status: DC | PRN

## 2014-02-14 NOTE — Assessment & Plan Note (Signed)
His BP is well controlled 

## 2014-02-14 NOTE — Assessment & Plan Note (Signed)
He will start pravachol for primary risk reduction

## 2014-02-14 NOTE — Assessment & Plan Note (Signed)
I will recheck his renal function and will monitor his lytes today

## 2014-02-14 NOTE — Progress Notes (Signed)
Pre visit review using our clinic review tool, if applicable. No additional management support is needed unless otherwise documented below in the visit note. 

## 2014-02-14 NOTE — Patient Instructions (Signed)

## 2014-02-14 NOTE — Progress Notes (Signed)
   Subjective:    Patient ID: John Hogan, male    DOB: Jul 09, 1950, 63 y.o.   MRN: [id number]  Hyperlipidemia This is a chronic problem. The current episode started more than 1 year ago. The problem is uncontrolled. Recent lipid tests were reviewed and are variable. He has no history of chronic renal disease, diabetes, hypothyroidism, liver disease, obesity or nephrotic syndrome. Pertinent negatives include no chest pain, focal sensory loss, focal weakness, leg pain, myalgias or shortness of breath. Current antihyperlipidemic treatment includes diet change. The current treatment provides mild improvement of lipids. Compliance problems include adherence to exercise and adherence to diet.       Review of Systems  Constitutional: Negative for fever, chills, diaphoresis, appetite change and fatigue.  HENT: Negative.   Eyes: Negative.   Respiratory: Negative.  Negative for cough, choking, chest tightness and shortness of breath.   Cardiovascular: Negative.  Negative for chest pain, palpitations and leg swelling.  Gastrointestinal: Negative.  Negative for nausea, vomiting, abdominal pain, diarrhea, constipation and blood in stool.  Endocrine: Negative.   Genitourinary: Negative.   Musculoskeletal: Negative.  Negative for myalgias.  Skin: Negative.  Negative for rash.  Allergic/Immunologic: Negative.   Neurological: Negative.  Negative for focal weakness.  Hematological: Negative.  Negative for adenopathy. Does not bruise/bleed easily.  Psychiatric/Behavioral: Positive for sleep disturbance (DFA and FA, ambien started by cardiology).       Objective:   Physical Exam  Vitals reviewed. Constitutional: He is oriented to person, place, and time. He appears well-developed and well-nourished. No distress.  HENT:  Head: Normocephalic and atraumatic.  Mouth/Throat: Oropharynx is clear and moist. No oropharyngeal exudate.  Eyes: Conjunctivae are normal. Right eye exhibits no discharge. Left eye  exhibits no discharge. No scleral icterus.  Neck: Normal range of motion. Neck supple. No JVD present. No tracheal deviation present. No thyromegaly present.  Cardiovascular: Normal rate, regular rhythm, normal heart sounds and intact distal pulses.  Exam reveals no gallop and no friction rub.   No murmur heard. Pulmonary/Chest: Effort normal and breath sounds normal. No stridor. No respiratory distress. He has no wheezes. He has no rales. He exhibits no tenderness.  Abdominal: Soft. Bowel sounds are normal. He exhibits no distension and no mass. There is no tenderness. There is no rebound and no guarding.  Musculoskeletal: Normal range of motion. He exhibits no edema and no tenderness.  Lymphadenopathy:    He has no cervical adenopathy.  Neurological: He is oriented to person, place, and time.  Skin: Skin is warm and dry. No rash noted. He is not diaphoretic. No erythema. No pallor.     Lab Results  Component Value Date   WBC 3.5* 02/27/2013   HGB 13.9 02/27/2013   HCT 40.5 02/27/2013   PLT 189.0 02/27/2013   GLUCOSE 87 02/27/2013   CHOL 213* 02/27/2013   TRIG 107.0 02/27/2013   HDL 62.10 02/27/2013   LDLDIRECT 140.7 02/27/2013   ALT 24 02/27/2013   AST 23 02/27/2013   NA 143 02/27/2013   K 3.8 02/27/2013   CL 105 02/27/2013   CREATININE 1.4 02/27/2013   BUN 14 02/27/2013   CO2 27 02/27/2013   TSH 1.58 02/16/2012   PSA 0.90 02/27/2013       Assessment & Plan:

## 2014-02-14 NOTE — Assessment & Plan Note (Signed)
I will recheck his renal function and will monitor his uric acid level

## 2014-03-13 ENCOUNTER — Other Ambulatory Visit: Payer: Self-pay | Admitting: Internal Medicine

## 2014-05-01 ENCOUNTER — Ambulatory Visit (INDEPENDENT_AMBULATORY_CARE_PROVIDER_SITE_OTHER)
Admission: RE | Admit: 2014-05-01 | Discharge: 2014-05-01 | Disposition: A | Discharge status: Home / Self Care | Payer: Managed Care, Other (non HMO) | Source: Ambulatory Visit | Attending: Internal Medicine | Admitting: Internal Medicine

## 2014-05-01 ENCOUNTER — Encounter: Payer: Self-pay | Admitting: Internal Medicine

## 2014-05-01 ENCOUNTER — Ambulatory Visit (INDEPENDENT_AMBULATORY_CARE_PROVIDER_SITE_OTHER): Payer: Managed Care, Other (non HMO) | Admitting: Internal Medicine

## 2014-05-01 VITALS — BP 130/80 | HR 57 | Temp 98.4°F | Resp 16 | Ht 71.0 in | Wt 237.0 lb

## 2014-05-01 DIAGNOSIS — S4992XA Unspecified injury of left shoulder and upper arm, initial encounter: Secondary | ICD-10-CM

## 2014-05-01 DIAGNOSIS — G4700 Insomnia, unspecified: Secondary | ICD-10-CM

## 2014-05-01 DIAGNOSIS — S4990XA Unspecified injury of shoulder and upper arm, unspecified arm, initial encounter: Secondary | ICD-10-CM | POA: Insufficient documentation

## 2014-05-01 NOTE — Progress Notes (Signed)
   Subjective:    Patient ID: John Hogan, male    DOB: 1951/04/21, 63 y.o.   MRN: [id number]  Shoulder Injury  The incident occurred at home. The left shoulder is affected. Incident onset: 3 weeks ago. The injury mechanism was a fall. The quality of the pain is described as aching. The pain does not radiate. The pain is at a severity of 2/10. The pain is mild. Pertinent negatives include no chest pain, muscle weakness, numbness or tingling. The symptoms are aggravated by movement. He has tried NSAIDs for the symptoms. The treatment provided mild relief.      Review of Systems  Constitutional: Negative.   HENT: Negative.   Eyes: Negative.   Respiratory: Negative.  Negative for cough, choking, chest tightness, shortness of breath and stridor.   Cardiovascular: Negative.  Negative for chest pain, palpitations and leg swelling.  Gastrointestinal: Negative.  Negative for nausea, vomiting, abdominal pain, diarrhea, constipation and blood in stool.  Endocrine: Negative.   Genitourinary: Negative.   Musculoskeletal: Positive for arthralgias. Negative for myalgias, back pain, joint swelling and neck pain.  Skin: Negative.  Negative for rash.  Allergic/Immunologic: Negative.   Neurological: Negative.  Negative for tingling and numbness.  Hematological: Negative.  Negative for adenopathy. Does not bruise/bleed easily.  Psychiatric/Behavioral: Negative.        Objective:   Physical Exam  Constitutional: He is oriented to person, place, and time. He appears well-developed and well-nourished. No distress.  HENT:  Head: Normocephalic and atraumatic.  Mouth/Throat: Oropharynx is clear and moist. No oropharyngeal exudate.  Eyes: Conjunctivae are normal. Right eye exhibits no discharge. Left eye exhibits no discharge. No scleral icterus.  Neck: Normal range of motion. Neck supple. No JVD present. No tracheal deviation present. No thyromegaly present.  Cardiovascular: Normal rate, regular  rhythm, normal heart sounds and intact distal pulses.  Exam reveals no gallop and no friction rub.   No murmur heard. Pulmonary/Chest: Effort normal and breath sounds normal. No stridor. No respiratory distress. He has no wheezes. He has no rales. He exhibits no tenderness.  Abdominal: Soft. Bowel sounds are normal. He exhibits no distension and no mass. There is no tenderness. There is no rebound and no guarding.  Musculoskeletal: He exhibits no edema or tenderness.       Left shoulder: He exhibits decreased range of motion. He exhibits no tenderness, no bony tenderness, no swelling, no effusion, no crepitus, no deformity, no laceration, no pain, no spasm, normal pulse and normal strength.       Cervical back: Normal. He exhibits normal range of motion, no tenderness, no bony tenderness, no swelling, no edema, no deformity, no laceration, no pain, no spasm and normal pulse.  Lymphadenopathy:    He has no cervical adenopathy.  Neurological: He is oriented to person, place, and time.  Skin: Skin is warm and dry. No rash noted. He is not diaphoretic. No erythema. No pallor.  Vitals reviewed.         Assessment & Plan:

## 2014-05-01 NOTE — Progress Notes (Signed)
Pre visit review using our clinic review tool, if applicable. No additional management support is needed unless otherwise documented below in the visit note. 

## 2014-05-01 NOTE — Patient Instructions (Signed)

## 2014-05-04 ENCOUNTER — Encounter: Payer: Self-pay | Admitting: Internal Medicine

## 2014-05-04 NOTE — Assessment & Plan Note (Signed)
On exam I am concerned about a rotator cuff injury with tendonitis but not a tear The plain films show DJD but no fracture Will treat with nsaids and follow for now

## 2014-05-07 ENCOUNTER — Telehealth: Payer: Self-pay | Admitting: Internal Medicine

## 2014-05-07 NOTE — Telephone Encounter (Signed)
Patient would like the results of the xray of his left shoulder.

## 2014-05-08 NOTE — Telephone Encounter (Signed)
It was normal, I sent him a message about this

## 2014-05-09 ENCOUNTER — Encounter (HOSPITAL_COMMUNITY): Payer: Self-pay | Admitting: Emergency Medicine

## 2014-05-09 ENCOUNTER — Emergency Department (HOSPITAL_COMMUNITY)
Admission: EM | Admit: 2014-05-09 | Discharge: 2014-05-09 | Disposition: A | Discharge status: Home / Self Care | Payer: Managed Care, Other (non HMO) | Attending: Emergency Medicine | Admitting: Emergency Medicine

## 2014-05-09 DIAGNOSIS — E785 Hyperlipidemia, unspecified: Secondary | ICD-10-CM | POA: Insufficient documentation

## 2014-05-09 DIAGNOSIS — M109 Gout, unspecified: Secondary | ICD-10-CM | POA: Diagnosis not present

## 2014-05-09 DIAGNOSIS — R61 Generalized hyperhidrosis: Secondary | ICD-10-CM | POA: Insufficient documentation

## 2014-05-09 DIAGNOSIS — Z87891 Personal history of nicotine dependence: Secondary | ICD-10-CM | POA: Diagnosis not present

## 2014-05-09 DIAGNOSIS — R55 Syncope and collapse: Secondary | ICD-10-CM | POA: Diagnosis present

## 2014-05-09 DIAGNOSIS — R112 Nausea with vomiting, unspecified: Secondary | ICD-10-CM | POA: Diagnosis not present

## 2014-05-09 DIAGNOSIS — R42 Dizziness and giddiness: Secondary | ICD-10-CM

## 2014-05-09 DIAGNOSIS — Z79899 Other long term (current) drug therapy: Secondary | ICD-10-CM | POA: Insufficient documentation

## 2014-05-09 DIAGNOSIS — I10 Essential (primary) hypertension: Secondary | ICD-10-CM | POA: Diagnosis not present

## 2014-05-09 DIAGNOSIS — R001 Bradycardia, unspecified: Secondary | ICD-10-CM | POA: Diagnosis not present

## 2014-05-09 LAB — COMPREHENSIVE METABOLIC PANEL
ALBUMIN: 4.2 g/dL (ref 3.5–5.2)
ALK PHOS: 92 U/L (ref 39–117)
ALT: 22 U/L (ref 0–53)
AST: 22 U/L (ref 0–37)
Anion gap: 12 (ref 5–15)
BUN: 17 mg/dL (ref 6–23)
CHLORIDE: 104 meq/L (ref 96–112)
CO2: 25 mEq/L (ref 19–32)
Calcium: 9.4 mg/dL (ref 8.4–10.5)
Creatinine, Ser: 1.37 mg/dL — ABNORMAL HIGH (ref 0.50–1.35)
GFR calc Af Amer: 61 mL/min — ABNORMAL LOW (ref >90)
GFR calc non Af Amer: 53 mL/min — ABNORMAL LOW (ref >90)
Glucose, Bld: 105 mg/dL — ABNORMAL HIGH (ref 70–99)
Potassium: 3.9 mEq/L (ref 3.7–5.3)
SODIUM: 141 meq/L (ref 137–147)
Total Bilirubin: 0.9 mg/dL (ref 0.3–1.2)
Total Protein: 7.9 g/dL (ref 6.0–8.3)

## 2014-05-09 LAB — CBC WITH DIFFERENTIAL/PLATELET
BASOS ABS: 0 10*3/uL (ref 0.0–0.1)
Basophils Relative: 1 % (ref 0–1)
EOS ABS: 0.3 10*3/uL (ref 0.0–0.7)
EOS PCT: 6 % — AB (ref 0–5)
HEMATOCRIT: 42.5 % (ref 39.0–52.0)
Hemoglobin: 14.6 g/dL (ref 13.0–17.0)
LYMPHS ABS: 1.2 10*3/uL (ref 0.7–4.0)
LYMPHS PCT: 30 % (ref 12–46)
MCH: 30.5 pg (ref 26.0–34.0)
MCHC: 34.4 g/dL (ref 30.0–36.0)
MCV: 88.7 fL (ref 78.0–100.0)
MONO ABS: 0.3 10*3/uL (ref 0.1–1.0)
Monocytes Relative: 6 % (ref 3–12)
Neutro Abs: 2.2 10*3/uL (ref 1.7–7.7)
Neutrophils Relative %: 57 % (ref 43–77)
Platelets: 172 10*3/uL (ref 150–400)
RBC: 4.79 MIL/uL (ref 4.22–5.81)
RDW: 13.4 % (ref 11.5–15.5)
WBC: 3.9 10*3/uL — ABNORMAL LOW (ref 4.0–10.5)

## 2014-05-09 LAB — URINALYSIS, ROUTINE W REFLEX MICROSCOPIC
BILIRUBIN URINE: NEGATIVE
GLUCOSE, UA: NEGATIVE mg/dL
HGB URINE DIPSTICK: NEGATIVE
Ketones, ur: NEGATIVE mg/dL
Leukocytes, UA: NEGATIVE
Nitrite: NEGATIVE
Protein, ur: NEGATIVE mg/dL
SPECIFIC GRAVITY, URINE: 1.008 (ref 1.005–1.030)
UROBILINOGEN UA: 1 mg/dL (ref 0.0–1.0)
pH: 7 (ref 5.0–8.0)

## 2014-05-09 LAB — I-STAT TROPONIN, ED
TROPONIN I, POC: 0.02 ng/mL (ref 0.00–0.08)
Troponin i, poc: 0 ng/mL (ref 0.00–0.08)

## 2014-05-09 MED ORDER — ONDANSETRON HCL 4 MG PO TABS: 4.0000 mg | ORAL_TABLET | Freq: Four times a day (QID) | ORAL | Status: DC

## 2014-05-09 MED ORDER — SODIUM CHLORIDE 0.9 % IV SOLN
Freq: Once | INTRAVENOUS | Status: AC
  Administered 2014-05-09: 08:00:00 via INTRAVENOUS

## 2014-05-09 MED ORDER — MECLIZINE HCL 25 MG PO TABS: 25.0000 mg | ORAL_TABLET | Freq: Four times a day (QID) | ORAL | Status: DC | PRN

## 2014-05-09 MED ORDER — MECLIZINE HCL 25 MG PO TABS
25.0000 mg | ORAL_TABLET | Freq: Once | ORAL | Status: AC
  Administered 2014-05-09: 25 mg via ORAL
  Filled 2014-05-09: qty 1

## 2014-05-09 MED ORDER — ONDANSETRON HCL 4 MG/2ML IJ SOLN
4.0000 mg | Freq: Once | INTRAMUSCULAR | Status: AC
  Administered 2014-05-09: 4 mg via INTRAVENOUS
  Filled 2014-05-09: qty 2

## 2014-05-09 NOTE — ED Notes (Signed)
Walked patient pt did well oxygen level stayed at 97 to 98 room air

## 2014-05-09 NOTE — ED Provider Notes (Signed)
CSN: [id number]     Arrival date & time 05/09/14  [id number] History   First MD Initiated Contact with Patient 05/09/14 [phone]     Chief Complaint  Patient presents with  . Near Syncope  . Nausea     (Consider location/radiation/quality/duration/timing/severity/associated sxs/prior Treatment) HPI  Patient reports he felt well yesterday. He states he got up at 5:30 this morning which is his usual time, however while he was making the bed he got lightheaded and felt like he might pass out. He states he had a spinning sensation for just a few seconds. He laid back down on the bed and felt better. He then started getting nauseated and he did have one episode of vomiting. He reports he had been diaphoretic for about 5 minutes and after he vomited he felt cold chills. He has continued mild nausea now. He states he only feels weak now. He denies chest pain, palpitation, headache, abdominal pain, shortness of breath, cough, or rhinorrhea. He states he feels like he might be trying to start having a headache. He states he's never felt this way before. He states he has not been around anybody who is ill. Patient has a history of cardiomyopathy and states he's been told before his heart rate is slow.   PCP Dr Alona Bene Cardiology Dr Percival Spanish  Past Medical History  Diagnosis Date  . Gout   . Hypertension   . Hypertensive cardiomyopathy     EF previously 35%;  echo 02/2009: Mild LVH, EF 60-65%, mild AI, ascending aorta mildly dilated, mild LAE  . H/O exercise stress test     Myoview 01/03/12: No scar or ischemia, EF 53%  . Hyperlipidemia    Past Surgical History  Procedure Laterality Date  . Wrist surgery      right  . Hernia repair      Umbilical  . Colonoscopy     Family History  Problem Relation Age of Onset  . Hypertension Father   . Colon cancer Father 32  . Hyperlipidemia Father   . Heart disease Father   . Diabetes Father   . Stroke Mother     deceased  . Colon cancer Mother 18  .  Arthritis Mother   . Hyperlipidemia Mother   . Hypertension Mother   . Diabetes Mother   . COPD Neg Hx   . Alcohol abuse Neg Hx   . Kidney disease Neg Hx   . Esophageal cancer Neg Hx   . Rectal cancer Neg Hx   . Stomach cancer Neg Hx    History  Substance Use Topics  . Smoking status: Former Smoker -- 1.00 packs/day for 2 years    Types: Cigarettes    Quit date: 01/10/1975  . Smokeless tobacco: Never Used     Comment: Only smoked two years  . Alcohol Use: No  employed driving a fork lift Lives with spouse  Review of Systems  All other systems reviewed and are negative.     Allergies  Review of patient's allergies indicates no known allergies.  Home Medications   Prior to Admission medications   Medication Sig Start Date End Date Taking? Authorizing Provider  allopurinol (ZYLOPRIM) 300 MG tablet TAKE 1 TABLET BY MOUTH EVERY DAY 03/13/14  Yes Janith Lima, MD  amLODipine (NORVASC) 10 MG tablet TAKE 1 TABLET BY MOUTH EVERY DAY 03/13/14  Yes Janith Lima, MD  colchicine 0.6 MG tablet Take 0.6 mg by mouth 2 (two) times daily as needed. 06/15/12  Yes Janith Lima, MD  dorzolamide-timolol (COSOPT) 22.3-6.8 MG/ML ophthalmic solution Place 1 drop into both eyes 2 (two) times daily.  02/26/13  Yes Historical Provider, MD  pravastatin (PRAVACHOL) 20 MG tablet Take 1 tablet (20 mg total) by mouth daily. 02/14/14  Yes Janith Lima, MD  Travoprost, BAK Free, (TRAVATAN) 0.004 % SOLN ophthalmic solution Place 1 drop into both eyes at bedtime.    Yes Historical Provider, MD  zolpidem (AMBIEN) 5 MG tablet Take 1 tablet (5 mg total) by mouth at bedtime as needed for sleep. 02/14/14  Yes Janith Lima, MD   BP 141/83 mmHg  Pulse 50  Temp(Src) 97.8 F (36.6 C) (Oral)  Resp 19  Ht 5\' 11"  (1.803 m)  Wt 234 lb (106.142 kg)  BMI 32.65 kg/m2  SpO2 100%  Vital signs normal except bradycardia      Orthostatic Vital Signs Orthostatic Lying  - BP- Lying: 154/87 mmHg ; Pulse- Lying: 48   Orthostatic Sitting - BP- Sitting: 157/83 mmHg ; Pulse- Sitting: 52  Orthostatic Standing at 0 minutes - BP- Standing at 0 minutes: 160/81 mmHg ; Pulse- Standing at 0 minutes: 57   Normal except for bradycardia and hypertension  Physical Exam  Constitutional: He is oriented to person, place, and time. He appears well-developed and well-nourished.  Non-toxic appearance. He does not appear ill. No distress.  HENT:  Head: Normocephalic and atraumatic.  Right Ear: External ear normal.  Left Ear: External ear normal.  Nose: Nose normal. No mucosal edema or rhinorrhea.  Mouth/Throat: Oropharynx is clear and moist and mucous membranes are normal. No dental abscesses or uvula swelling.  Eyes: Conjunctivae and EOM are normal. Pupils are equal, round, and reactive to light.  Neck: Normal range of motion and full passive range of motion without pain. Neck supple.  Cardiovascular: Regular rhythm and normal heart sounds.  Bradycardia present.  Exam reveals no gallop and no friction rub.   No murmur heard. Pulmonary/Chest: Effort normal and breath sounds normal. No respiratory distress. He has no wheezes. He has no rhonchi. He has no rales. He exhibits no tenderness and no crepitus.  Abdominal: Soft. Normal appearance and bowel sounds are normal. He exhibits no distension. There is no tenderness. There is no rebound and no guarding.  Musculoskeletal: Normal range of motion. He exhibits no edema or tenderness.  Moves all extremities well.   Neurological: He is alert and oriented to person, place, and time. He has normal strength. No cranial nerve deficit.  Skin: Skin is warm, dry and intact. No rash noted. No erythema. No pallor.  Psychiatric: He has a normal mood and affect. His speech is normal and behavior is normal. His mood appears not anxious.  Nursing note and vitals reviewed.   ED Course  Procedures (including critical care time)  Medications  0.9 %  sodium chloride infusion ( Intravenous  Stopped 05/09/14 1254)  meclizine (ANTIVERT) tablet 25 mg (25 mg Oral Given 05/09/14 0820)  ondansetron (ZOFRAN) injection 4 mg (4 mg Intravenous Given 05/09/14 0823)   Patient continued to do well during his ED visit. A second troponin was done and was negative. Patient ambulated with nursing staff and had no further dizziness or weakness. His pulse ox remained 97-98% on room air. Patient will be discharged with meclizine and Zofran for possible vertigo.   Labs Review Results for orders placed or performed during the hospital encounter of 05/09/14  Comprehensive metabolic panel  Result Value Ref Range   Sodium  141 137 - 147 mEq/L   Potassium 3.9 3.7 - 5.3 mEq/L   Chloride 104 96 - 112 mEq/L   CO2 25 19 - 32 mEq/L   Glucose, Bld 105 (H) 70 - 99 mg/dL   BUN 17 6 - 23 mg/dL   Creatinine, Ser 1.37 (H) 0.50 - 1.35 mg/dL   Calcium 9.4 8.4 - 10.5 mg/dL   Total Protein 7.9 6.0 - 8.3 g/dL   Albumin 4.2 3.5 - 5.2 g/dL   AST 22 0 - 37 U/L   ALT 22 0 - 53 U/L   Alkaline Phosphatase 92 39 - 117 U/L   Total Bilirubin 0.9 0.3 - 1.2 mg/dL   GFR calc non Af Amer 53 (L) >90 mL/min   GFR calc Af Amer 61 (L) >90 mL/min   Anion gap 12 5 - 15  CBC with Differential  Result Value Ref Range   WBC 3.9 (L) 4.0 - 10.5 K/uL   RBC 4.79 4.22 - 5.81 MIL/uL   Hemoglobin 14.6 13.0 - 17.0 g/dL   HCT 42.5 39.0 - 52.0 %   MCV 88.7 78.0 - 100.0 fL   MCH 30.5 26.0 - 34.0 pg   MCHC 34.4 30.0 - 36.0 g/dL   RDW 13.4 11.5 - 15.5 %   Platelets 172 150 - 400 K/uL   Neutrophils Relative % 57 43 - 77 %   Neutro Abs 2.2 1.7 - 7.7 K/uL   Lymphocytes Relative 30 12 - 46 %   Lymphs Abs 1.2 0.7 - 4.0 K/uL   Monocytes Relative 6 3 - 12 %   Monocytes Absolute 0.3 0.1 - 1.0 K/uL   Eosinophils Relative 6 (H) 0 - 5 %   Eosinophils Absolute 0.3 0.0 - 0.7 K/uL   Basophils Relative 1 0 - 1 %   Basophils Absolute 0.0 0.0 - 0.1 K/uL  Urinalysis, Routine w reflex microscopic  Result Value Ref Range   Color, Urine YELLOW  YELLOW   APPearance CLEAR CLEAR   Specific Gravity, Urine 1.008 1.005 - 1.030   pH 7.0 5.0 - 8.0   Glucose, UA NEGATIVE NEGATIVE mg/dL   Hgb urine dipstick NEGATIVE NEGATIVE   Bilirubin Urine NEGATIVE NEGATIVE   Ketones, ur NEGATIVE NEGATIVE mg/dL   Protein, ur NEGATIVE NEGATIVE mg/dL   Urobilinogen, UA 1.0 0.0 - 1.0 mg/dL   Nitrite NEGATIVE NEGATIVE   Leukocytes, UA NEGATIVE NEGATIVE  I-stat troponin, ED  Result Value Ref Range   Troponin i, poc 0.02 0.00 - 0.08 ng/mL   Comment 3          I-stat troponin, ED  Result Value Ref Range   Troponin i, poc 0.00 0.00 - 0.08 ng/mL   Comment 3           Laboratory interpretation all normal except low total WBC without neutropenia     Imaging Review No results found.   EKG Interpretation   Date/Time:  Friday May 09 2014 07:45:38 EST Ventricular Rate:  51 PR Interval:  188 QRS Duration: 118 QT Interval:  498 QTC Calculation: 459 R Axis:   -40 Text Interpretation:  Age not entered, assumed to be  63 years old for  purpose of ECG interpretation Sinus rhythm Incomplete RBBB and LAFB Left  ventricular hypertrophy Baseline wander in lead(s) V2 V3 No significant  change since last tracing 03 Jan 2012 Confirmed by Scotlynn Noyes  MD-I, Benjerman Molinelli  ([id number]) on 05/09/2014 8:42:40 AM     HR was 47 in EKG x 2  in 2013    MDM   Final diagnoses:  Dizziness  Near syncope   New Prescriptions   MECLIZINE (ANTIVERT) 25 MG TABLET    Take 1 tablet (25 mg total) by mouth 4 (four) times daily as needed for dizziness.   ONDANSETRON (ZOFRAN) 4 MG TABLET    Take 1 tablet (4 mg total) by mouth every 6 (six) hours.    Plan discharge  Rolland Porter, MD, Alanson Aly, MD 05/09/14 [phone]

## 2014-05-09 NOTE — ED Notes (Addendum)
Pt a&o x4,  c/o near syncope this morning when standing up from bed around 0530, diaphoretic and then vomited afterwards. Pt denies any pain at this time.

## 2014-05-09 NOTE — ED Notes (Signed)
Patient encouraged to provide staff a urine sample.

## 2014-05-09 NOTE — Discharge Instructions (Signed)
Take the meclizine if you have any more dizziness. Use the zofran for nausea or vomiting if needed. Return to the ED if you get a headache, chest pain or have uncontrolled dizziness or vomiting with the medications. No driving until you are sure the dizziness has been gone for 24-48 hours.    Dizziness Dizziness is a common problem. It is a feeling of unsteadiness or light-headedness. You may feel like you are about to faint. Dizziness can lead to injury if you stumble or fall. A person of any age group can suffer from dizziness, but dizziness is more common in older adults. CAUSES  Dizziness can be caused by many different things, including:  Middle ear problems.  Standing for too long.  Infections.  An allergic reaction.  Aging.  An emotional response to something, such as the sight of blood.  Side effects of medicines.  Tiredness.  Problems with circulation or blood pressure.  Excessive use of alcohol or medicines, or illegal drug use.  Breathing too fast (hyperventilation).  An irregular heart rhythm (arrhythmia).  A low red blood cell count (anemia).  Pregnancy.  Vomiting, diarrhea, fever, or other illnesses that cause body fluid loss (dehydration).  Diseases or conditions such as Parkinson's disease, high blood pressure (hypertension), diabetes, and thyroid problems.  Exposure to extreme heat. DIAGNOSIS  Your health care provider will ask about your symptoms, perform a physical exam, and perform an electrocardiogram (ECG) to record the electrical activity of your heart. Your health care provider may also perform other heart or blood tests to determine the cause of your dizziness. These may include:  Transthoracic echocardiogram (TTE). During echocardiography, sound waves are used to evaluate how blood flows through your heart.  Transesophageal echocardiogram (TEE).  Cardiac monitoring. This allows your health care provider to monitor your heart rate and rhythm in  real time.  Holter monitor. This is a portable device that records your heartbeat and can help diagnose heart arrhythmias. It allows your health care provider to track your heart activity for several days if needed.  Stress tests by exercise or by giving medicine that makes the heart beat faster. TREATMENT  Treatment of dizziness depends on the cause of your symptoms and can vary greatly. HOME CARE INSTRUCTIONS   Drink enough fluids to keep your urine clear or pale yellow. This is especially important in very hot weather. In older adults, it is also important in cold weather.  Take your medicine exactly as directed if your dizziness is caused by medicines. When taking blood pressure medicines, it is especially important to get up slowly.  Rise slowly from chairs and steady yourself until you feel okay.  In the morning, first sit up on the side of the bed. When you feel okay, stand slowly while holding onto something until you know your balance is fine.  Move your legs often if you need to stand in one place for a long time. Tighten and relax your muscles in your legs while standing.  Have someone stay with you for 1-2 days if dizziness continues to be a problem. Do this until you feel you are well enough to stay alone. Have the person call your health care provider if he or she notices changes in you that are concerning.  Do not drive or use heavy machinery if you feel dizzy.  Do not drink alcohol. SEEK IMMEDIATE MEDICAL CARE IF:   Your dizziness or light-headedness gets worse.  You feel nauseous or vomit.  You have problems  talking, walking, or using your arms, hands, or legs.  You feel weak.  You are not thinking clearly or you have trouble forming sentences. It may take a friend or family member to notice this.  You have chest pain, abdominal pain, shortness of breath, or sweating.  Your vision changes.  You notice any bleeding.  You have side effects from medicine that  seems to be getting worse rather than better. MAKE SURE YOU:   Understand these instructions.  Will watch your condition.  Will get help right away if you are not doing well or get worse. Document Released: 12/07/2000 Document Revised: 06/18/2013 Document Reviewed: 12/31/2010 [facility] Patient Information 2015 [place], [place]. This information is not intended to replace advice given to you by your health care provider. Make sure you discuss any questions you have with your health care provider.  Driving and Equipment Restrictions Some medical problems make it dangerous to drive, ride a bike, or use machines. Some of these problems are:  A hard blow to the head (concussion).  Passing out (fainting).  Twitching and shaking (seizures).  Low blood sugar.  Taking medicine to help you relax (sedatives).  Taking pain medicines.  Wearing an eye patch.  Wearing splints. This can make it hard to use parts of your body that you need to drive safely. HOME CARE   Do not drive until your doctor says it is okay.  Do not use machines until your doctor says it is okay. You may need a form signed by your doctor (medical release) before you can drive again. You may also need this form before you do other tasks where you need to be fully alert. MAKE SURE YOU:  Understand these instructions.  Will watch your condition.  Will get help right away if you are not doing well or get worse. Document Released: 07/21/2004 Document Revised: 09/05/2011 Document Reviewed: 10/21/2009 [facility] Patient Information 2015 [place], [place]. This information is not intended to replace advice given to you by your health care provider. Make sure you discuss any questions you have with your health care provider.

## 2014-05-26 ENCOUNTER — Other Ambulatory Visit: Payer: Managed Care, Other (non HMO)

## 2014-05-26 ENCOUNTER — Encounter: Payer: Self-pay | Admitting: Internal Medicine

## 2014-05-26 ENCOUNTER — Ambulatory Visit (INDEPENDENT_AMBULATORY_CARE_PROVIDER_SITE_OTHER)
Admission: RE | Admit: 2014-05-26 | Discharge: 2014-05-26 | Disposition: A | Discharge status: Home / Self Care | Payer: Managed Care, Other (non HMO) | Source: Ambulatory Visit | Attending: Internal Medicine | Admitting: Internal Medicine

## 2014-05-26 ENCOUNTER — Ambulatory Visit (INDEPENDENT_AMBULATORY_CARE_PROVIDER_SITE_OTHER): Payer: Managed Care, Other (non HMO) | Admitting: Internal Medicine

## 2014-05-26 VITALS — BP 144/80 | HR 49 | Temp 98.2°F | Ht 71.0 in | Wt 238.0 lb

## 2014-05-26 DIAGNOSIS — R05 Cough: Secondary | ICD-10-CM

## 2014-05-26 DIAGNOSIS — R059 Cough, unspecified: Secondary | ICD-10-CM | POA: Insufficient documentation

## 2014-05-26 DIAGNOSIS — J202 Acute bronchitis due to streptococcus: Secondary | ICD-10-CM

## 2014-05-26 DIAGNOSIS — R9389 Abnormal findings on diagnostic imaging of other specified body structures: Secondary | ICD-10-CM

## 2014-05-26 DIAGNOSIS — R938 Abnormal findings on diagnostic imaging of other specified body structures: Secondary | ICD-10-CM

## 2014-05-26 MED ORDER — HYDROCOD POLST-CPM POLST ER 10-8 MG PO CP12: 1.0000 | ORAL_CAPSULE | Freq: Two times a day (BID) | ORAL | Status: DC | PRN

## 2014-05-26 MED ORDER — AZITHROMYCIN 500 MG PO TABS: 500.0000 mg | ORAL_TABLET | Freq: Every day | ORAL | Status: DC

## 2014-05-26 NOTE — Progress Notes (Signed)
Subjective:    Patient ID: John Hogan, male    DOB: 04-15-1951, 63 y.o.   MRN: [id number]  Cough This is a new problem. The current episode started 1 to 4 weeks ago. The problem has been unchanged. The cough is non-productive. Associated symptoms include postnasal drip, rhinorrhea and a sore throat. Pertinent negatives include no chest pain, chills, ear congestion, ear pain, fever, headaches, heartburn, hemoptysis, myalgias, nasal congestion, rash, shortness of breath, sweats, weight loss or wheezing. Nothing aggravates the symptoms. He has tried OTC cough suppressant for the symptoms. The treatment provided mild relief. There is no history of asthma, bronchiectasis, bronchitis, COPD, emphysema, environmental allergies or pneumonia.      Review of Systems  Constitutional: Negative.  Negative for fever, chills, weight loss, diaphoresis, appetite change and fatigue.  HENT: Positive for postnasal drip, rhinorrhea and sore throat. Negative for ear pain, sinus pressure and trouble swallowing.   Eyes: Negative.   Respiratory: Positive for cough. Negative for apnea, hemoptysis, choking, chest tightness, shortness of breath, wheezing and stridor.   Cardiovascular: Negative.  Negative for chest pain, palpitations and leg swelling.  Gastrointestinal: Negative.  Negative for heartburn and abdominal pain.  Endocrine: Negative.   Genitourinary: Negative.   Musculoskeletal: Negative.  Negative for myalgias.  Skin: Negative.  Negative for rash.  Allergic/Immunologic: Negative.  Negative for environmental allergies.  Neurological: Negative.  Negative for headaches.  Hematological: Negative.  Negative for adenopathy. Does not bruise/bleed easily.  Psychiatric/Behavioral: Negative.        Objective:   Physical Exam  Constitutional: He is oriented to person, place, and time. He appears well-developed and well-nourished.  Non-toxic appearance. He does not have a sickly appearance. He does not appear  ill. No distress.  HENT:  Head: Normocephalic and atraumatic.  Mouth/Throat: Oropharynx is clear and moist. No oropharyngeal exudate.  Eyes: Conjunctivae are normal. Right eye exhibits no discharge. Left eye exhibits no discharge. No scleral icterus.  Neck: Normal range of motion. Neck supple. No JVD present. No tracheal deviation present. No thyromegaly present.  Cardiovascular: Normal rate, regular rhythm, normal heart sounds and intact distal pulses.  Exam reveals no gallop and no friction rub.   No murmur heard. Pulmonary/Chest: Effort normal and breath sounds normal. No stridor. No respiratory distress. He has no wheezes. He has no rales. He exhibits no tenderness.  Abdominal: Soft. Bowel sounds are normal. He exhibits no distension and no mass. There is no tenderness. There is no rebound and no guarding.  Musculoskeletal: Normal range of motion. He exhibits no edema or tenderness.  Lymphadenopathy:    He has no cervical adenopathy.  Neurological: He is oriented to person, place, and time.  Skin: Skin is warm and dry. No rash noted. He is not diaphoretic. No erythema. No pallor.  Psychiatric: He has a normal mood and affect. His behavior is normal. Judgment and thought content normal.  Vitals reviewed.    Lab Results  Component Value Date   WBC 3.9* 05/09/2014   HGB 14.6 05/09/2014   HCT 42.5 05/09/2014   PLT 172 05/09/2014   GLUCOSE 105* 05/09/2014   CHOL 213* 02/27/2013   TRIG 107.0 02/27/2013   HDL 62.10 02/27/2013   LDLDIRECT 140.7 02/27/2013   ALT 22 05/09/2014   AST 22 05/09/2014   NA 141 05/09/2014   K 3.9 05/09/2014   CL 104 05/09/2014   CREATININE 1.37* 05/09/2014   BUN 17 05/09/2014   CO2 25 05/09/2014   TSH 1.58 02/16/2012  PSA 0.90 02/27/2013       Assessment & Plan:

## 2014-05-26 NOTE — Patient Instructions (Signed)
Cough, Adult  A cough is a reflex that helps clear your throat and airways. It can help heal the body or may be a reaction to an irritated airway. A cough may only last 2 or 3 weeks (acute) or may last more than 8 weeks (chronic).  CAUSES Acute cough:  Viral or bacterial infections. Chronic cough:  Infections.  Allergies.  Asthma.  Post-nasal drip.  Smoking.  Heartburn or acid reflux.  Some medicines.  Chronic lung problems (COPD).  Cancer. SYMPTOMS   Cough.  Fever.  Chest pain.  Increased breathing rate.  High-pitched whistling sound when breathing (wheezing).  Colored mucus that you cough up (sputum). TREATMENT   A bacterial cough may be treated with antibiotic medicine.  A viral cough must run its course and will not respond to antibiotics.  Your caregiver may recommend other treatments if you have a chronic cough. HOME CARE INSTRUCTIONS   Only take over-the-counter or prescription medicines for pain, discomfort, or fever as directed by your caregiver. Use cough suppressants only as directed by your caregiver.  Use a cold steam vaporizer or humidifier in your bedroom or home to help loosen secretions.  Sleep in a semi-upright position if your cough is worse at night.  Rest as needed.  Stop smoking if you smoke. SEEK IMMEDIATE MEDICAL CARE IF:   You have pus in your sputum.  Your cough starts to worsen.  You cannot control your cough with suppressants and are losing sleep.  You begin coughing up blood.  You have difficulty breathing.  You develop pain which is getting worse or is uncontrolled with medicine.  You have a fever. MAKE SURE YOU:   Understand these instructions.  Will watch your condition.  Will get help right away if you are not doing well or get worse. Document Released: 12/10/2010 Document Revised: 09/05/2011 Document Reviewed: 12/10/2010 [employer] Patient Information 2015 [employer]. This information is not intended  to replace advice given to you by your health care provider. Make sure you discuss any questions you have with your health care provider.  

## 2014-05-26 NOTE — Progress Notes (Signed)
Pre visit review using our clinic review tool, if applicable. No additional management support is needed unless otherwise documented below in the visit note. 

## 2014-05-27 DIAGNOSIS — R9389 Abnormal findings on diagnostic imaging of other specified body structures: Secondary | ICD-10-CM | POA: Insufficient documentation

## 2014-05-27 NOTE — Assessment & Plan Note (Signed)
I will treat the infection with zithromax and will control the cough with tussicaps 

## 2014-05-27 NOTE — Assessment & Plan Note (Signed)
CXR is abnormal, will get a CT w contrast done for better detail

## 2014-06-02 ENCOUNTER — Ambulatory Visit: Payer: Managed Care, Other (non HMO) | Admitting: Internal Medicine

## 2014-06-06 ENCOUNTER — Ambulatory Visit (INDEPENDENT_AMBULATORY_CARE_PROVIDER_SITE_OTHER)
Admission: RE | Admit: 2014-06-06 | Discharge: 2014-06-06 | Disposition: A | Discharge status: Home / Self Care | Payer: Managed Care, Other (non HMO) | Source: Ambulatory Visit | Attending: Internal Medicine | Admitting: Internal Medicine

## 2014-06-06 DIAGNOSIS — R05 Cough: Secondary | ICD-10-CM

## 2014-06-06 DIAGNOSIS — R059 Cough, unspecified: Secondary | ICD-10-CM

## 2014-06-06 DIAGNOSIS — R9389 Abnormal findings on diagnostic imaging of other specified body structures: Secondary | ICD-10-CM

## 2014-06-06 DIAGNOSIS — R938 Abnormal findings on diagnostic imaging of other specified body structures: Secondary | ICD-10-CM

## 2014-06-06 MED ORDER — IOHEXOL 300 MG/ML  SOLN
80.0000 mL | Freq: Once | INTRAMUSCULAR | Status: AC | PRN
  Administered 2014-06-06: 80 mL via INTRAVENOUS

## 2014-06-07 ENCOUNTER — Encounter: Payer: Self-pay | Admitting: Internal Medicine

## 2014-06-09 ENCOUNTER — Other Ambulatory Visit: Payer: Self-pay

## 2014-06-09 ENCOUNTER — Ambulatory Visit (INDEPENDENT_AMBULATORY_CARE_PROVIDER_SITE_OTHER): Payer: Managed Care, Other (non HMO) | Admitting: Internal Medicine

## 2014-06-09 DIAGNOSIS — I10 Essential (primary) hypertension: Secondary | ICD-10-CM

## 2014-06-09 DIAGNOSIS — M75100 Unspecified rotator cuff tear or rupture of unspecified shoulder, not specified as traumatic: Secondary | ICD-10-CM | POA: Insufficient documentation

## 2014-06-09 DIAGNOSIS — R9389 Abnormal findings on diagnostic imaging of other specified body structures: Secondary | ICD-10-CM

## 2014-06-09 DIAGNOSIS — E785 Hyperlipidemia, unspecified: Secondary | ICD-10-CM

## 2014-06-09 DIAGNOSIS — M19012 Primary osteoarthritis, left shoulder: Secondary | ICD-10-CM

## 2014-06-09 DIAGNOSIS — R938 Abnormal findings on diagnostic imaging of other specified body structures: Secondary | ICD-10-CM

## 2014-06-09 DIAGNOSIS — M1000 Idiopathic gout, unspecified site: Secondary | ICD-10-CM

## 2014-06-09 DIAGNOSIS — M25512 Pain in left shoulder: Secondary | ICD-10-CM

## 2014-06-09 MED ORDER — AMLODIPINE BESYLATE 10 MG PO TABS: 10.0000 mg | ORAL_TABLET | Freq: Every day | ORAL | Status: DC

## 2014-06-09 MED ORDER — HYDROCODONE-ACETAMINOPHEN 5-325 MG PO TABS: 1.0000 | ORAL_TABLET | Freq: Four times a day (QID) | ORAL | Status: DC | PRN

## 2014-06-09 MED ORDER — PRAVASTATIN SODIUM 20 MG PO TABS: 20.0000 mg | ORAL_TABLET | Freq: Every day | ORAL | Status: DC

## 2014-06-09 MED ORDER — COLCHICINE 0.6 MG PO TABS: 0.6000 mg | ORAL_TABLET | Freq: Two times a day (BID) | ORAL | Status: DC

## 2014-06-09 NOTE — Patient Instructions (Signed)

## 2014-06-09 NOTE — Progress Notes (Signed)
Pre visit review using our clinic review tool, if applicable. No additional management support is needed unless otherwise documented below in the visit note. 

## 2014-06-10 ENCOUNTER — Encounter: Payer: Self-pay | Admitting: Internal Medicine

## 2014-06-10 ENCOUNTER — Other Ambulatory Visit: Payer: Self-pay | Admitting: *Deleted

## 2014-06-10 DIAGNOSIS — E785 Hyperlipidemia, unspecified: Secondary | ICD-10-CM

## 2014-06-10 MED ORDER — PRAVASTATIN SODIUM 20 MG PO TABS: 20.0000 mg | ORAL_TABLET | Freq: Every day | ORAL | Status: DC

## 2014-06-10 NOTE — Assessment & Plan Note (Signed)
Benign variant

## 2014-06-10 NOTE — Assessment & Plan Note (Signed)
His BP is well controlled 

## 2014-06-10 NOTE — Progress Notes (Signed)
   Subjective:    Patient ID: John Hogan, male    DOB: 27-Sep-1950, 63 y.o.   MRN: [id number]  Shoulder Pain  The pain is present in the left shoulder. This is a recurrent problem. The current episode started more than 1 month ago. The problem occurs intermittently. The problem has been gradually worsening. The quality of the pain is described as aching. The pain is at a severity of 6/10. The pain is moderate. Associated symptoms include a limited range of motion. Pertinent negatives include no fever, inability to bear weight, itching, joint locking, joint swelling, numbness, stiffness or tingling. The symptoms are aggravated by activity. He has tried NSAIDS for the symptoms. The treatment provided mild relief. His past medical history is significant for osteoarthritis.      Review of Systems  Constitutional: Negative for fever.  Musculoskeletal: Negative for stiffness.  Skin: Negative for itching.  Neurological: Negative for tingling and numbness.  All other systems reviewed and are negative.      Objective:   Physical Exam  Constitutional: He is oriented to person, place, and time. He appears well-developed and well-nourished. No distress.  HENT:  Head: Normocephalic and atraumatic.  Mouth/Throat: Oropharynx is clear and moist. No oropharyngeal exudate.  Eyes: Conjunctivae are normal. Right eye exhibits no discharge. Left eye exhibits no discharge. No scleral icterus.  Neck: Normal range of motion. Neck supple. No JVD present. No tracheal deviation present. No thyromegaly present.  Cardiovascular: Normal rate, regular rhythm, normal heart sounds and intact distal pulses.  Exam reveals no gallop and no friction rub.   No murmur heard. Pulmonary/Chest: Effort normal and breath sounds normal. No stridor. No respiratory distress. He has no wheezes. He has no rales. He exhibits no tenderness.  Abdominal: Soft. Bowel sounds are normal. He exhibits no distension and no mass. There is no  tenderness. There is no rebound and no guarding.  Musculoskeletal: He exhibits no edema or tenderness.       Left shoulder: He exhibits decreased range of motion. He exhibits no tenderness, no bony tenderness, no swelling, no effusion, no crepitus, no deformity, no laceration, no pain, no spasm, normal pulse and normal strength.  Lymphadenopathy:    He has no cervical adenopathy.  Neurological: He is oriented to person, place, and time.  Skin: Skin is warm and dry. No rash noted. He is not diaphoretic. No erythema. No pallor.  Vitals reviewed.    Lab Results  Component Value Date   WBC 3.9* 05/09/2014   HGB 14.6 05/09/2014   HCT 42.5 05/09/2014   PLT 172 05/09/2014   GLUCOSE 105* 05/09/2014   CHOL 213* 02/27/2013   TRIG 107.0 02/27/2013   HDL 62.10 02/27/2013   LDLDIRECT 140.7 02/27/2013   ALT 22 05/09/2014   AST 22 05/09/2014   NA 141 05/09/2014   K 3.9 05/09/2014   CL 104 05/09/2014   CREATININE 1.37* 05/09/2014   BUN 17 05/09/2014   CO2 25 05/09/2014   TSH 1.58 02/16/2012   PSA 0.90 02/27/2013       Assessment & Plan:

## 2014-06-10 NOTE — Assessment & Plan Note (Signed)
He reports a lot of pain at night, will try norco for pain He wants to see if a steroid injection will help, will refer to sports medicine

## 2014-06-11 ENCOUNTER — Other Ambulatory Visit: Payer: Self-pay | Admitting: *Deleted

## 2014-06-11 MED ORDER — ALLOPURINOL 300 MG PO TABS: 300.0000 mg | ORAL_TABLET | Freq: Every day | ORAL | Status: DC

## 2014-06-13 ENCOUNTER — Other Ambulatory Visit: Payer: Self-pay | Admitting: *Deleted

## 2014-06-13 MED ORDER — ALLOPURINOL 300 MG PO TABS: 300.0000 mg | ORAL_TABLET | Freq: Every day | ORAL | Status: DC

## 2014-06-16 ENCOUNTER — Ambulatory Visit: Payer: Managed Care, Other (non HMO) | Admitting: Internal Medicine

## 2014-07-07 ENCOUNTER — Encounter: Payer: Self-pay | Admitting: Cardiology

## 2014-07-07 ENCOUNTER — Encounter: Payer: Self-pay | Admitting: Family Medicine

## 2014-07-07 ENCOUNTER — Ambulatory Visit (INDEPENDENT_AMBULATORY_CARE_PROVIDER_SITE_OTHER): Payer: 59 | Admitting: Cardiology

## 2014-07-07 ENCOUNTER — Ambulatory Visit (INDEPENDENT_AMBULATORY_CARE_PROVIDER_SITE_OTHER): Payer: 59 | Admitting: Family Medicine

## 2014-07-07 ENCOUNTER — Other Ambulatory Visit (INDEPENDENT_AMBULATORY_CARE_PROVIDER_SITE_OTHER): Payer: 59

## 2014-07-07 VITALS — BP 138/90 | HR 62 | Ht 71.0 in | Wt 241.6 lb

## 2014-07-07 VITALS — BP 132/80 | HR 62 | Ht 71.0 in | Wt 241.0 lb

## 2014-07-07 DIAGNOSIS — I119 Hypertensive heart disease without heart failure: Secondary | ICD-10-CM

## 2014-07-07 DIAGNOSIS — M25512 Pain in left shoulder: Secondary | ICD-10-CM

## 2014-07-07 DIAGNOSIS — I10 Essential (primary) hypertension: Secondary | ICD-10-CM

## 2014-07-07 DIAGNOSIS — I351 Nonrheumatic aortic (valve) insufficiency: Secondary | ICD-10-CM

## 2014-07-07 DIAGNOSIS — M19012 Primary osteoarthritis, left shoulder: Secondary | ICD-10-CM

## 2014-07-07 DIAGNOSIS — M129 Arthropathy, unspecified: Secondary | ICD-10-CM

## 2014-07-07 MED ORDER — DICLOFENAC SODIUM 2 % TD SOLN: TRANSDERMAL | Status: DC

## 2014-07-07 NOTE — Progress Notes (Signed)
HPI The patient presents for followup of aortic insufficiency. He has had previously reduced ejection fraction but it has been improved a followup. the last echo was last year.  Aortic insufficiency has been moderate but was mild more recently.  He presents for routine followup. He says he's been doing relatively well. He does get a little bit of dyspnea with exertion but this has been chronic. He has to provide care for his dad which is every night and weekend.  He's not had any PND or orthopnea. He denies any chest pressure, neck or arm discomfort. He's not really noticing any palpitations, presyncope or syncope.   No Known Allergies  Current Outpatient Prescriptions  Medication Sig Dispense Refill  . allopurinol (ZYLOPRIM) 300 MG tablet Take 1 tablet (300 mg total) by mouth daily. 90 tablet 1  . amLODipine (NORVASC) 10 MG tablet Take 1 tablet (10 mg total) by mouth daily. 90 tablet 1  . colchicine 0.6 MG tablet Take 1 tablet (0.6 mg total) by mouth 2 (two) times daily. (Patient taking differently: Take 0.6 mg by mouth 2 (two) times daily as needed. ) 180 tablet 1  . dorzolamide-timolol (COSOPT) 22.3-6.8 MG/ML ophthalmic solution Place 1 drop into both eyes 2 (two) times daily.     . pravastatin (PRAVACHOL) 20 MG tablet Take 1 tablet (20 mg total) by mouth daily. 90 tablet 3  . Travoprost, BAK Free, (TRAVATAN) 0.004 % SOLN ophthalmic solution Place 1 drop into both eyes at bedtime.     Marland Kitchen zolpidem (AMBIEN) 5 MG tablet Take 1 tablet (5 mg total) by mouth at bedtime as needed for sleep. 30 tablet 5   No current facility-administered medications for this visit.    Past Medical History  Diagnosis Date  . Gout   . Hypertension   . Hypertensive cardiomyopathy     EF previously 35%;  echo 02/2009: Mild LVH, EF 60-65%, mild AI, ascending aorta mildly dilated, mild LAE  . H/O exercise stress test     Myoview 01/03/12: No scar or ischemia, EF 53%  . Hyperlipidemia     Past Surgical History    Procedure Laterality Date  . Wrist surgery      right  . Hernia repair      Umbilical  . Colonoscopy      ROS:  Positive for erectile dysfunction and insomnia. Otherwise as stated in the HPI and negative for all other systems.  PHYSICAL EXAM BP 138/90 mmHg  Pulse 62  Ht 5\' 11"  (1.803 m)  Wt 241 lb 9.6 oz (109.589 kg)  BMI 33.71 kg/m2 GENERAL:  Well appearing HEENT:  Pupils equal round and reactive, fundi not visualized, oral mucosa unremarkable, dentures NECK:  No jugular venous distention, waveform within normal limits, carotid upstroke brisk and symmetric, no bruits, no thyromegaly LUNGS:  Clear to auscultation bilaterally CHEST:  Unremarkable HEART:  PMI not displaced or sustained,S1 and S2 within normal limits, no S3, no S4, no clicks, no rubs, 2/6 diastolic murmur heard best at the left sternal border midsternal during full expiration, no diastolic murmurs ABD:  Flat, positive bowel sounds normal in frequency in pitch, no bruits, no rebound, no guarding, no midline pulsatile mass, no hepatomegaly, no splenomegaly EXT:  2 plus pulses throughout, no edema, no cyanosis no clubbing  ASSESSMENT AND PLAN  Aortic insufficiency - This has been mild on echo in July.  There has been no change clinically.  I will not repeat an echo this year.    HTN -  The blood pressure is at target. No change in medications is indicated. We will continue with therapeutic lifestyle changes (TLC).  Cardiomyopathy - He no longer carries this diagnosis as his EF has been well preserved. No change in therapy is indicated.  Bruit - He had no carotid stenosis on Doppler last summer.  No further work up is indicated.

## 2014-07-07 NOTE — Assessment & Plan Note (Signed)
Patient was given an injection today with good resolution of pain. We discussed icing regimen and home exercises. We discussed the importance of him controlling his gout. I do not see any gouty deposits though noted. Patient was given a prescription of topical anti-implant was which I think could be beneficial. We discussed over-the-counter medications in patient is adamant that these do not seem to help. Patient will come back again in 3 weeks for further evaluation.

## 2014-07-07 NOTE — Patient Instructions (Signed)
Your physician wants you to follow-up in: 1 Year. You will receive a reminder letter in the mail two months in advance. If you don't receive a letter, please call our office to schedule the follow-up appointment.  

## 2014-07-07 NOTE — Patient Instructions (Signed)
Good to see you Ice 20 minutes 2 times daily. Usually after activity and before bed. Exercises 3 times a week.  Pennsaid twice daily  Vitamin D 2000 IU daily.  See me again in 3 weeks.

## 2014-07-07 NOTE — Progress Notes (Signed)
[facility] [address], [place] [address] Phone: [phone] Subjective:    I'm seeing this patient by the request  of:  Scarlette Calico, MD   CC: left shoulder pain  [id number] John Hogan is a 64 y.o. male coming in with complaint of left shoulder pain.  Been 3 months, after trying to catch himself after fall. Denies any radiation or weakness, dull aching pain, worse with activity overhead. Patient states that this is approximately a 6 out of 10 in severity. Still able to do daily activities. Still able to sleep fairly comfortably at night but causes him to take a significant amount more time to follow sleep. Patient is tried some over-the-counter medicines with minimal improvement.  Reviewing patient's chart patient did have x-rays that did show moderate osteophytic changes of this left shoulder.     Past medical history, social, surgical and family history all reviewed in electronic medical record.   Review of Systems: No headache, visual changes, nausea, vomiting, diarrhea, constipation, dizziness, abdominal pain, skin rash, fevers, chills, night sweats, weight loss, swollen lymph nodes, body aches, joint swelling, muscle aches, chest pain, shortness of breath, mood changes.   Objective Blood pressure 132/80, pulse 62, height 5\' 11"  (1.803 m), weight 241 lb (109.317 kg).  General: No apparent distress alert and oriented x3 mood and affect normal, dressed appropriately.  HEENT: Pupils equal, extraocular movements intact  Respiratory: Patient's speak in full sentences and does not appear short of breath  Cardiovascular: No lower extremity edema, non tender, no erythema  Skin: Warm dry intact with no signs of infection or rash on extremities or on axial skeleton.  Abdomen: Soft nontender  Neuro: Cranial nerves II through XII are intact, neurovascularly intact in all extremities with 2+ DTRs and 2+ pulses.  Lymph: No lymphadenopathy of  posterior or anterior cervical chain or axillae bilaterally.  Gait normal with good balance and coordination.  MSK:  Non tender with full range of motion and good stability and symmetric strength and tone of  elbows, wrist, hip, knee and ankles bilaterally.  Shoulder: left Inspection reveals no abnormalities, atrophy or asymmetry. Palpation is normal with no tenderness over AC joint or bicipital groove. ROM is full in all planes passively. Rotator cuff strength normal throughout. signs of impingement with positive Neer and Hawkin's tests, but negative empty can sign. Speeds and Yergason's tests normal. No labral pathology noted with negative Obrien's, negative clunk and good stability.mild positive crossover test Normal scapular function observed. No painful arc and no drop arm sign. No apprehension sign  MSK US performed of: left This study was ordered, performed, and interpreted by Charlann Boxer D.O.  Shoulder:   Supraspinatus:  degenerative changes noted but no true tear, Bursal bulge seen with shoulder abduction on impingement view. Infraspinatus:  Mild intersubstance tearing mostly near the insertion Significant increase in Doppler flow Subscapularis:  Appears normal on long and transverse views. Positive bursa Teres Minor:  Appears normal on long and transverse views. AC joint:  Moderate arthritis Glenohumeral Joint:  Appears normal without effusion.moderate arthritis Glenoid Labrum:  Intact without visualized tears. Biceps Tendon:  Appears normal on long and transverse views, no fraying of tendon, tendon located in intertubercular groove, no subluxation with shoulder internal or external rotation.  Impression: Subacromial bursitis, moderate underlying osteophytic changes and mild degenerative changes of the rotator cuff, moderate acromioclavicular arthritis  Procedure: Real-time Ultrasound Guided Injection of left glenohumeral joint Device: GE Logiq E  Ultrasound guided  injection  is preferred based studies that show increased duration, increased effect, greater accuracy, decreased procedural pain, increased response rate with ultrasound guided versus blind injection.  Verbal informed consent obtained.  Time-out conducted.  Noted no overlying erythema, induration, or other signs of local infection.  Skin prepped in a sterile fashion.  Local anesthesia: Topical Ethyl chloride.  With sterile technique and under real time ultrasound guidance:  Joint visualized.  23g 1  inch needle inserted posterior approach. Pictures taken for needle placement. Patient did have injection of 2 cc of 1% lidocaine, 2 cc of 0.5% Marcaine, and 1.0 cc of Kenalog 40 mg/dL. Completed without difficulty  Pain immediately resolved suggesting accurate placement of the medication.  Advised to call if fevers/chills, erythema, induration, drainage, or persistent bleeding.  Images permanently stored and available for review in the ultrasound unit.  Impression: Technically successful ultrasound guided injection.     Impression and Recommendations:     This case required medical decision making of moderate complexity.

## 2014-07-29 ENCOUNTER — Ambulatory Visit (INDEPENDENT_AMBULATORY_CARE_PROVIDER_SITE_OTHER): Payer: 59 | Admitting: Internal Medicine

## 2014-07-29 ENCOUNTER — Encounter: Payer: Self-pay | Admitting: Internal Medicine

## 2014-07-29 VITALS — BP 128/80 | HR 57 | Temp 97.7°F | Resp 16 | Ht 71.0 in | Wt 241.0 lb

## 2014-07-29 DIAGNOSIS — I10 Essential (primary) hypertension: Secondary | ICD-10-CM

## 2014-07-29 NOTE — Progress Notes (Signed)
Pre visit review using our clinic review tool, if applicable. No additional management support is needed unless otherwise documented below in the visit note. 

## 2014-07-30 NOTE — Progress Notes (Signed)
   Subjective:    Patient ID: John Hogan, male    DOB: 08/12/1950, 64 y.o.   MRN: [id number]  Hypertension This is a chronic problem. The current episode started more than 1 year ago. The problem is unchanged. The problem is controlled. Pertinent negatives include no anxiety, blurred vision, chest pain, headaches, malaise/fatigue, neck pain, orthopnea, palpitations, peripheral edema, PND, shortness of breath or sweats. There are no associated agents to hypertension. Past treatments include calcium channel blockers and angiotensin blockers. The current treatment provides moderate improvement. Compliance problems include diet and exercise.       Review of Systems  Constitutional: Negative.  Negative for fever, chills, malaise/fatigue, diaphoresis, appetite change and fatigue.  HENT: Negative.   Eyes: Negative.  Negative for blurred vision.  Respiratory: Negative.  Negative for apnea, cough, choking, chest tightness, shortness of breath, wheezing and stridor.   Cardiovascular: Negative.  Negative for chest pain, palpitations, orthopnea, leg swelling and PND.  Gastrointestinal: Negative.  Negative for nausea, vomiting, abdominal pain, diarrhea, constipation and blood in stool.  Endocrine: Negative.   Genitourinary: Negative.   Musculoskeletal: Negative.  Negative for myalgias, back pain, arthralgias and neck pain.  Skin: Negative.   Allergic/Immunologic: Negative.   Neurological: Negative.  Negative for dizziness, seizures, weakness, numbness and headaches.  Hematological: Negative.  Negative for adenopathy. Does not bruise/bleed easily.  Psychiatric/Behavioral: Negative.        Objective:   Physical Exam  Constitutional: He is oriented to person, place, and time. He appears well-developed and well-nourished. No distress.  HENT:  Head: Normocephalic and atraumatic.  Mouth/Throat: Oropharynx is clear and moist. No oropharyngeal exudate.  Eyes: Conjunctivae are normal. Right eye  exhibits no discharge. Left eye exhibits no discharge. No scleral icterus.  Neck: Normal range of motion. Neck supple. No JVD present. No tracheal deviation present. No thyromegaly present.  Cardiovascular: Normal rate, regular rhythm, normal heart sounds and intact distal pulses.  Exam reveals no gallop and no friction rub.   No murmur heard. Pulmonary/Chest: Effort normal and breath sounds normal. No respiratory distress. He has no wheezes. He has no rales. He exhibits no tenderness.  Abdominal: Soft. Bowel sounds are normal. He exhibits no distension and no mass. There is no tenderness. There is no rebound and no guarding.  Musculoskeletal: Normal range of motion. He exhibits no edema or tenderness.  Lymphadenopathy:    He has no cervical adenopathy.  Neurological: He is oriented to person, place, and time.  Skin: Skin is warm and dry. No rash noted. He is not diaphoretic. No erythema. No pallor.  Nursing note and vitals reviewed.    Lab Results  Component Value Date   WBC 3.9* 05/09/2014   HGB 14.6 05/09/2014   HCT 42.5 05/09/2014   PLT 172 05/09/2014   GLUCOSE 105* 05/09/2014   CHOL 213* 02/27/2013   TRIG 107.0 02/27/2013   HDL 62.10 02/27/2013   LDLDIRECT 140.7 02/27/2013   ALT 22 05/09/2014   AST 22 05/09/2014   NA 141 05/09/2014   K 3.9 05/09/2014   CL 104 05/09/2014   CREATININE 1.37* 05/09/2014   BUN 17 05/09/2014   CO2 25 05/09/2014   TSH 1.58 02/16/2012   PSA 0.90 02/27/2013       Assessment & Plan:

## 2014-07-30 NOTE — Assessment & Plan Note (Signed)
His BP is well controlled 

## 2014-08-06 ENCOUNTER — Ambulatory Visit (INDEPENDENT_AMBULATORY_CARE_PROVIDER_SITE_OTHER): Payer: 59 | Admitting: Internal Medicine

## 2014-08-06 ENCOUNTER — Other Ambulatory Visit (INDEPENDENT_AMBULATORY_CARE_PROVIDER_SITE_OTHER): Payer: 59

## 2014-08-06 ENCOUNTER — Encounter: Payer: Self-pay | Admitting: Internal Medicine

## 2014-08-06 VITALS — BP 140/92 | HR 60 | Temp 97.6°F | Resp 16 | Wt 238.0 lb

## 2014-08-06 DIAGNOSIS — N183 Chronic kidney disease, stage 3 unspecified: Secondary | ICD-10-CM

## 2014-08-06 DIAGNOSIS — E785 Hyperlipidemia, unspecified: Secondary | ICD-10-CM

## 2014-08-06 DIAGNOSIS — Z0000 Encounter for general adult medical examination without abnormal findings: Secondary | ICD-10-CM

## 2014-08-06 DIAGNOSIS — I10 Essential (primary) hypertension: Secondary | ICD-10-CM

## 2014-08-06 LAB — CBC WITH DIFFERENTIAL/PLATELET
BASOS ABS: 0 10*3/uL (ref 0.0–0.1)
Basophils Relative: 0.7 % (ref 0.0–3.0)
EOS PCT: 3.9 % (ref 0.0–5.0)
Eosinophils Absolute: 0.2 10*3/uL (ref 0.0–0.7)
HEMATOCRIT: 45.4 % (ref 39.0–52.0)
Hemoglobin: 15.3 g/dL (ref 13.0–17.0)
LYMPHS ABS: 1.8 10*3/uL (ref 0.7–4.0)
Lymphocytes Relative: 42.1 % (ref 12.0–46.0)
MCHC: 33.8 g/dL (ref 30.0–36.0)
MCV: 90.9 fl (ref 78.0–100.0)
MONO ABS: 0.3 10*3/uL (ref 0.1–1.0)
MONOS PCT: 6.2 % (ref 3.0–12.0)
Neutro Abs: 2 10*3/uL (ref 1.4–7.7)
Neutrophils Relative %: 47.1 % (ref 43.0–77.0)
PLATELETS: 211 10*3/uL (ref 150.0–400.0)
RBC: 4.99 Mil/uL (ref 4.22–5.81)
RDW: 14.3 % (ref 11.5–15.5)
WBC: 4.2 10*3/uL (ref 4.0–10.5)

## 2014-08-06 LAB — URINALYSIS, ROUTINE W REFLEX MICROSCOPIC
Bilirubin Urine: NEGATIVE
HGB URINE DIPSTICK: NEGATIVE
Ketones, ur: NEGATIVE
LEUKOCYTES UA: NEGATIVE
NITRITE: NEGATIVE
RBC / HPF: NONE SEEN (ref 0–?)
Specific Gravity, Urine: 1.025 (ref 1.000–1.030)
Total Protein, Urine: NEGATIVE
UROBILINOGEN UA: 0.2 (ref 0.0–1.0)
Urine Glucose: NEGATIVE
pH: 5.5 (ref 5.0–8.0)

## 2014-08-06 LAB — COMPREHENSIVE METABOLIC PANEL
ALBUMIN: 4.5 g/dL (ref 3.5–5.2)
ALT: 35 U/L (ref 0–53)
AST: 22 U/L (ref 0–37)
Alkaline Phosphatase: 85 U/L (ref 39–117)
BUN: 28 mg/dL — ABNORMAL HIGH (ref 6–23)
CHLORIDE: 108 meq/L (ref 96–112)
CO2: 26 meq/L (ref 19–32)
Calcium: 9.9 mg/dL (ref 8.4–10.5)
Creatinine, Ser: 1.83 mg/dL — ABNORMAL HIGH (ref 0.40–1.50)
GFR: 48.05 mL/min — AB (ref >60.00)
GLUCOSE: 85 mg/dL (ref 70–99)
POTASSIUM: 3.8 meq/L (ref 3.5–5.1)
SODIUM: 143 meq/L (ref 135–145)
TOTAL PROTEIN: 7.8 g/dL (ref 6.0–8.3)
Total Bilirubin: 1.1 mg/dL (ref 0.2–1.2)

## 2014-08-06 LAB — LIPID PANEL
Cholesterol: 199 mg/dL (ref 0–200)
HDL: 70.9 mg/dL (ref >39.00)
LDL Cholesterol: 114 mg/dL — ABNORMAL HIGH (ref 0–99)
NONHDL: 128.1
Total CHOL/HDL Ratio: 3
Triglycerides: 73 mg/dL (ref 0.0–149.0)
VLDL: 14.6 mg/dL (ref 0.0–40.0)

## 2014-08-06 LAB — FECAL OCCULT BLOOD, GUAIAC: FECAL OCCULT BLD: NEGATIVE

## 2014-08-06 LAB — PSA: PSA: 0.81 ng/mL (ref 0.10–4.00)

## 2014-08-06 LAB — TSH: TSH: 1.34 u[IU]/mL (ref 0.35–4.50)

## 2014-08-06 NOTE — Patient Instructions (Signed)
Hypertension Hypertension, commonly called high blood pressure, is when the force of blood pumping through your arteries is too strong. Your arteries are the blood vessels that carry blood from your heart throughout your body. A blood pressure reading consists of a higher number over a lower number, such as 110/72. The higher number (systolic) is the pressure inside your arteries when your heart pumps. The lower number (diastolic) is the pressure inside your arteries when your heart relaxes. Ideally you want your blood pressure below 120/80. Hypertension forces your heart to work harder to pump blood. Your arteries may become narrow or stiff. Having hypertension puts you at risk for heart disease, stroke, and other problems.  RISK FACTORS Some risk factors for high blood pressure are controllable. Others are not.  Risk factors you cannot control include:   Race. You may be at higher risk if you are African American.  Age. Risk increases with age.  Gender. Men are at higher risk than women before age 45 years. After age 65, women are at higher risk than men. Risk factors you can control include:  Not getting enough exercise or physical activity.  Being overweight.  Getting too much fat, sugar, calories, or salt in your diet.  Drinking too much alcohol. SIGNS AND SYMPTOMS Hypertension does not usually cause signs or symptoms. Extremely high blood pressure (hypertensive crisis) may cause headache, anxiety, shortness of breath, and nosebleed. DIAGNOSIS  To check if you have hypertension, your health care provider will measure your blood pressure while you are seated, with your arm held at the level of your heart. It should be measured at least twice using the same arm. Certain conditions can cause a difference in blood pressure between your right and left arms. A blood pressure reading that is higher than normal on one occasion does not mean that you need treatment. If one blood pressure reading  is high, ask your health care provider about having it checked again. TREATMENT  Treating high blood pressure includes making lifestyle changes and possibly taking medicine. Living a healthy lifestyle can help lower high blood pressure. You may need to change some of your habits. Lifestyle changes may include:  Following the DASH diet. This diet is high in fruits, vegetables, and whole grains. It is low in salt, red meat, and added sugars.  Getting at least 2 hours of brisk physical activity every week.  Losing weight if necessary.  Not smoking.  Limiting alcoholic beverages.  Learning ways to reduce stress. If lifestyle changes are not enough to get your blood pressure under control, your health care provider may prescribe medicine. You may need to take more than one. Work closely with your health care provider to understand the risks and benefits. HOME CARE INSTRUCTIONS  Have your blood pressure rechecked as directed by your health care provider.   Take medicines only as directed by your health care provider. Follow the directions carefully. Blood pressure medicines must be taken as prescribed. The medicine does not work as well when you skip doses. Skipping doses also puts you at risk for problems.   Do not smoke.   Monitor your blood pressure at home as directed by your health care provider. SEEK MEDICAL CARE IF:   You think you are having a reaction to medicines taken.  You have recurrent headaches or feel dizzy.  You have swelling in your ankles.  You have trouble with your vision. SEEK IMMEDIATE MEDICAL CARE IF:  You develop a severe headache or confusion.    You have unusual weakness, numbness, or feel faint.  You have severe chest or abdominal pain.  You vomit repeatedly.  You have trouble breathing. MAKE SURE YOU:   Understand these instructions.  Will watch your condition.  Will get help right away if you are not doing well or get worse. Document  Released: 06/13/2005 Document Revised: 10/28/2013 Document Reviewed: 04/05/2013 Exit[employer] Patient Information 2015 [employer]. This information is not intended to replace advice given to you by your health care provider. Make sure you discuss any questions you have with your health care provider. Health Maintenance A healthy lifestyle and preventative care can promote health and wellness.  Maintain regular health, dental, and eye exams.  Eat a healthy diet. Foods like vegetables, fruits, whole grains, low-fat dairy products, and lean protein foods contain the nutrients you need and are low in calories. Decrease your intake of foods high in solid fats, added sugars, and salt. Get information about a proper diet from your health care provider, if necessary.  Regular physical exercise is one of the most important things you can do for your health. Most adults should get at least 150 minutes of moderate-intensity exercise (any activity that increases your heart rate and causes you to sweat) each week. In addition, most adults need muscle-strengthening exercises on 2 or more days a week.   Maintain a healthy weight. The body mass index (BMI) is a screening tool to identify possible weight problems. It provides an estimate of body fat based on height and weight. Your health care provider can find your BMI and can help you achieve or maintain a healthy weight. For males 20 years and older:  A BMI below 18.5 is considered underweight.  A BMI of 18.5 to 24.9 is normal.  A BMI of 25 to 29.9 is considered overweight.  A BMI of 30 and above is considered obese.  Maintain normal blood lipids and cholesterol by exercising and minimizing your intake of saturated fat. Eat a balanced diet with plenty of fruits and vegetables. Blood tests for lipids and cholesterol should begin at age 20 and be repeated every 5 years. If your lipid or cholesterol levels are high, you are over age 50, or you are at high risk  for heart disease, you may need your cholesterol levels checked more frequently.Ongoing high lipid and cholesterol levels should be treated with medicines if diet and exercise are not working.  If you smoke, find out from your health care provider how to quit. If you do not use tobacco, do not start.  Lung cancer screening is recommended for adults aged 55-80 years who are at high risk for developing lung cancer because of a history of smoking. A yearly low-dose CT scan of the lungs is recommended for people who have at least a 30-pack-year history of smoking and are current smokers or have quit within the past 15 years. A pack year of smoking is smoking an average of 1 pack of cigarettes a day for 1 year (for example, a 30-pack-year history of smoking could mean smoking 1 pack a day for 30 years or 2 packs a day for 15 years). Yearly screening should continue until the smoker has stopped smoking for at least 15 years. Yearly screening should be stopped for people who develop a health problem that would prevent them from having lung cancer treatment.  If you choose to drink alcohol, do not have more than 2 drinks per day. One drink is considered to be 12 oz (  360 mL) of beer, 5 oz (150 mL) of wine, or 1.5 oz (45 mL) of liquor.  Avoid the use of street drugs. Do not share needles with anyone. Ask for help if you need support or instructions about stopping the use of drugs.  High blood pressure causes heart disease and increases the risk of stroke. Blood pressure should be checked at least every 1-2 years. Ongoing high blood pressure should be treated with medicines if weight loss and exercise are not effective.  If you are 45-79 years old, ask your health care provider if you should take aspirin to prevent heart disease.  Diabetes screening involves taking a blood sample to check your fasting blood sugar level. This should be done once every 3 years after age 45 if you are at a normal weight and without  risk factors for diabetes. Testing should be considered at a younger age or be carried out more frequently if you are overweight and have at least 1 risk factor for diabetes.  Colorectal cancer can be detected and often prevented. Most routine colorectal cancer screening begins at the age of 50 and continues through age 75. However, your health care provider may recommend screening at an earlier age if you have risk factors for colon cancer. On a yearly basis, your health care provider may provide home test kits to check for hidden blood in the stool. A small camera at the end of a tube may be used to directly examine the colon (sigmoidoscopy or colonoscopy) to detect the earliest forms of colorectal cancer. Talk to your health care provider about this at age 50 when routine screening begins. A direct exam of the colon should be repeated every 5-10 years through age 75, unless early forms of precancerous polyps or small growths are found.  People who are at an increased risk for hepatitis B should be screened for this virus. You are considered at high risk for hepatitis B if:  You were born in a country where hepatitis B occurs often. Talk with your health care provider about which countries are considered high risk.  Your parents were born in a high-risk country and you have not received a shot to protect against hepatitis B (hepatitis B vaccine).  You have HIV or AIDS.  You use needles to inject street drugs.  You live with, or have sex with, someone who has hepatitis B.  You are a man who has sex with other men (MSM).  You get hemodialysis treatment.  You take certain medicines for conditions like cancer, organ transplantation, and autoimmune conditions.  Hepatitis C blood testing is recommended for all people born from 1945 through 1965 and any individual with known risk factors for hepatitis C.  Healthy men should no longer receive prostate-specific antigen (PSA) blood tests as part of  routine cancer screening. Talk to your health care provider about prostate cancer screening.  Testicular cancer screening is not recommended for adolescents or adult males who have no symptoms. Screening includes self-exam, a health care provider exam, and other screening tests. Consult with your health care provider about any symptoms you have or any concerns you have about testicular cancer.  Practice safe sex. Use condoms and avoid high-risk sexual practices to reduce the spread of sexually transmitted infections (STIs).  You should be screened for STIs, including gonorrhea and chlamydia if:  You are sexually active and are younger than 24 years.  You are older than 24 years, and your health care provider tells you   that you are at risk for this type of infection.  Your sexual activity has changed since you were last screened, and you are at an increased risk for chlamydia or gonorrhea. Ask your health care provider if you are at risk.  If you are at risk of being infected with HIV, it is recommended that you take a prescription medicine daily to prevent HIV infection. This is called pre-exposure prophylaxis (PrEP). You are considered at risk if:  You are a man who has sex with other men (MSM).  You are a heterosexual man who is sexually active with multiple partners.  You take drugs by injection.  You are sexually active with a partner who has HIV.  Talk with your health care provider about whether you are at high risk of being infected with HIV. If you choose to begin PrEP, you should first be tested for HIV. You should then be tested every 3 months for as long as you are taking PrEP.  Use sunscreen. Apply sunscreen liberally and repeatedly throughout the day. You should seek shade when your shadow is shorter than you. Protect yourself by wearing long sleeves, pants, a wide-brimmed hat, and sunglasses year round whenever you are outdoors.  Tell your health care provider of new moles  or changes in moles, especially if there is a change in shape or color. Also, tell your health care provider if a mole is larger than the size of a pencil eraser.  A one-time screening for abdominal aortic aneurysm (AAA) and surgical repair of large AAAs by ultrasound is recommended for men aged 65-75 years who are current or former smokers.  Stay current with your vaccines (immunizations). Document Released: 12/10/2007 Document Revised: 06/18/2013 Document Reviewed: 11/08/2010 ExitCare Patient Information 2015 [employer]. This information is not intended to replace advice given to you by your health care provider. Make sure you discuss any questions you have with your health care provider.  

## 2014-08-06 NOTE — Progress Notes (Signed)
Pre visit review using our clinic review tool, if applicable. No additional management support is needed unless otherwise documented below in the visit note. 

## 2014-08-07 ENCOUNTER — Encounter: Payer: Self-pay | Admitting: Internal Medicine

## 2014-08-07 DIAGNOSIS — N183 Chronic kidney disease, stage 3 unspecified: Secondary | ICD-10-CM | POA: Insufficient documentation

## 2014-08-07 LAB — HEPATITIS C ANTIBODY: HCV Ab: NEGATIVE

## 2014-08-07 NOTE — Progress Notes (Signed)
Subjective:    Patient ID: John Hogan, male    DOB: 05-07-1951, 64 y.o.   MRN: [id number]  Hypertension This is a chronic problem. The current episode started more than 1 year ago. The problem has been gradually worsening since onset. The problem is uncontrolled. Pertinent negatives include no anxiety, blurred vision, chest pain, headaches, malaise/fatigue, neck pain, orthopnea, palpitations, peripheral edema, PND, shortness of breath or sweats. Agents associated with hypertension include NSAIDs. Past treatments include calcium channel blockers. The current treatment provides moderate improvement. Compliance problems include diet and exercise.  Hypertensive end-organ damage includes kidney disease. Identifiable causes of hypertension include chronic renal disease.      Review of Systems  Constitutional: Negative.  Negative for fever, chills, malaise/fatigue, diaphoresis, appetite change and fatigue.  HENT: Negative.   Eyes: Negative.  Negative for blurred vision.  Respiratory: Negative.  Negative for cough, choking, chest tightness, shortness of breath and stridor.   Cardiovascular: Negative.  Negative for chest pain, palpitations, orthopnea, leg swelling and PND.  Gastrointestinal: Negative.  Negative for nausea, vomiting, abdominal pain, diarrhea, constipation and blood in stool.  Endocrine: Negative.   Genitourinary: Negative.  Negative for difficulty urinating.  Musculoskeletal: Positive for arthralgias. Negative for myalgias, back pain, joint swelling, neck pain and neck stiffness.  Skin: Negative.  Negative for rash.  Allergic/Immunologic: Negative.   Neurological: Negative.  Negative for headaches.  Hematological: Negative.  Negative for adenopathy. Does not bruise/bleed easily.  Psychiatric/Behavioral: Negative.        Objective:   Physical Exam  Constitutional: He is oriented to person, place, and time. He appears well-developed and well-nourished. No distress.  HENT:    Head: Normocephalic and atraumatic.  Mouth/Throat: Oropharynx is clear and moist. No oropharyngeal exudate.  Eyes: Conjunctivae are normal. Right eye exhibits no discharge. Left eye exhibits no discharge. No scleral icterus.  Neck: Normal range of motion. Neck supple. No JVD present. No tracheal deviation present. No thyromegaly present.  Cardiovascular: Normal rate, regular rhythm, normal heart sounds and intact distal pulses.  Exam reveals no gallop and no friction rub.   No murmur heard. Pulmonary/Chest: Effort normal and breath sounds normal. No stridor. No respiratory distress. He has no wheezes. He has no rales. He exhibits no tenderness.  Abdominal: Soft. Bowel sounds are normal. He exhibits no distension and no mass. There is no tenderness. There is no rebound and no guarding. Hernia confirmed negative in the right inguinal area and confirmed negative in the left inguinal area.  Genitourinary: Rectum normal, testes normal and penis normal. Rectal exam shows no external hemorrhoid, no internal hemorrhoid, no fissure, no mass, no tenderness and anal tone normal. Guaiac negative stool. Prostate is enlarged (1+ smooth symm BPH). Prostate is not tender. Right testis shows no mass, no swelling and no tenderness. Right testis is descended. Left testis shows no mass, no swelling and no tenderness. Left testis is descended. Uncircumcised. No phimosis, paraphimosis, hypospadias, penile erythema or penile tenderness. No discharge found.  Musculoskeletal: Normal range of motion. He exhibits no edema or tenderness.  Lymphadenopathy:    He has no cervical adenopathy.       Right: No inguinal adenopathy present.       Left: No inguinal adenopathy present.  Neurological: He is oriented to person, place, and time.  Skin: Skin is warm and dry. No rash noted. He is not diaphoretic. No erythema. No pallor.  Vitals reviewed.   Lab Results  Component Value Date   WBC 4.2 08/06/2014  HGB 15.3 08/06/2014    HCT 45.4 08/06/2014   PLT 211.0 08/06/2014   GLUCOSE 85 08/06/2014   CHOL 199 08/06/2014   TRIG 73.0 08/06/2014   HDL 70.90 08/06/2014   LDLDIRECT 140.7 02/27/2013   LDLCALC 114* 08/06/2014   ALT 35 08/06/2014   AST 22 08/06/2014   NA 143 08/06/2014   K 3.8 08/06/2014   CL 108 08/06/2014   CREATININE 1.83* 08/06/2014   BUN 28* 08/06/2014   CO2 26 08/06/2014   TSH 1.34 08/06/2014   PSA 0.81 08/06/2014        Assessment & Plan:

## 2014-08-11 MED ORDER — LOSARTAN POTASSIUM 50 MG PO TABS: 50.0000 mg | ORAL_TABLET | Freq: Every day | ORAL | Status: DC

## 2014-08-11 NOTE — Assessment & Plan Note (Signed)
His BP is not adequately well controlled and he has developed slight worsening in his renal function Will check an u/s and I have asked him to start an ARB

## 2014-08-11 NOTE — Assessment & Plan Note (Signed)

## 2014-08-11 NOTE — Assessment & Plan Note (Signed)
He has a history of renal insufficiency and today his renal function has declined I will order a renal u/s to check the size and shape of his kidneys and to screen for any evidence of an obstructive process He will avoid nephrotoxic agents and will work on his BP control and his diet

## 2014-08-11 NOTE — Assessment & Plan Note (Signed)
He has achieved his LDL goal and is doing well on the statin

## 2014-08-14 ENCOUNTER — Ambulatory Visit
Admission: RE | Admit: 2014-08-14 | Discharge: 2014-08-14 | Disposition: A | Discharge status: Home / Self Care | Payer: 59 | Source: Ambulatory Visit | Attending: Internal Medicine | Admitting: Internal Medicine

## 2014-08-14 DIAGNOSIS — N183 Chronic kidney disease, stage 3 unspecified: Secondary | ICD-10-CM

## 2014-09-02 ENCOUNTER — Telehealth: Payer: Self-pay

## 2014-09-02 DIAGNOSIS — M1000 Idiopathic gout, unspecified site: Secondary | ICD-10-CM

## 2014-09-02 NOTE — Telephone Encounter (Signed)
Received fax from pharmacy stating colchicine no longer covered. Pharmacy advising to change to mitigare(?)

## 2014-09-03 MED ORDER — COLCHICINE 0.6 MG PO CAPS: 1.0000 | ORAL_CAPSULE | Freq: Two times a day (BID) | ORAL | Status: DC

## 2014-09-03 NOTE — Telephone Encounter (Signed)
changed

## 2014-09-04 ENCOUNTER — Other Ambulatory Visit: Payer: Self-pay | Admitting: Internal Medicine

## 2014-10-16 ENCOUNTER — Other Ambulatory Visit: Payer: Self-pay | Admitting: Internal Medicine

## 2014-10-16 NOTE — Telephone Encounter (Signed)
Faxed script back to [facility].../lmb 

## 2014-10-28 ENCOUNTER — Other Ambulatory Visit: Payer: Self-pay

## 2014-10-28 DIAGNOSIS — I10 Essential (primary) hypertension: Secondary | ICD-10-CM

## 2014-10-28 DIAGNOSIS — N183 Chronic kidney disease, stage 3 unspecified: Secondary | ICD-10-CM

## 2014-10-28 DIAGNOSIS — E785 Hyperlipidemia, unspecified: Secondary | ICD-10-CM

## 2014-10-28 MED ORDER — PRAVASTATIN SODIUM 20 MG PO TABS: 20.0000 mg | ORAL_TABLET | Freq: Every day | ORAL | Status: DC

## 2014-10-28 MED ORDER — LOSARTAN POTASSIUM 50 MG PO TABS: 50.0000 mg | ORAL_TABLET | Freq: Every day | ORAL | Status: DC

## 2014-10-28 MED ORDER — AMLODIPINE BESYLATE 10 MG PO TABS: 10.0000 mg | ORAL_TABLET | Freq: Every day | ORAL | Status: DC

## 2014-10-28 MED ORDER — ALLOPURINOL 300 MG PO TABS: 300.0000 mg | ORAL_TABLET | Freq: Every day | ORAL | Status: DC

## 2014-11-03 ENCOUNTER — Telehealth: Payer: Self-pay | Admitting: Internal Medicine

## 2014-11-03 ENCOUNTER — Encounter: Payer: Self-pay | Admitting: Internal Medicine

## 2014-11-03 NOTE — Telephone Encounter (Signed)
Insurance won't pay for Colchicine (MITIGARE) 0.6 MG CAPS [id number] and if there is something else to replace it. Pharmacy is [facility] on [address].

## 2014-11-05 ENCOUNTER — Ambulatory Visit (INDEPENDENT_AMBULATORY_CARE_PROVIDER_SITE_OTHER): Payer: 59 | Admitting: Internal Medicine

## 2014-11-05 ENCOUNTER — Encounter: Payer: Self-pay | Admitting: Internal Medicine

## 2014-11-05 VITALS — BP 140/80 | HR 57 | Temp 97.9°F | Ht 71.0 in | Wt 242.5 lb

## 2014-11-05 DIAGNOSIS — M1000 Idiopathic gout, unspecified site: Secondary | ICD-10-CM

## 2014-11-05 DIAGNOSIS — M25532 Pain in left wrist: Secondary | ICD-10-CM

## 2014-11-05 MED ORDER — COLCHICINE 0.6 MG PO CAPS: 1.0000 | ORAL_CAPSULE | Freq: Two times a day (BID) | ORAL | Status: DC

## 2014-11-05 MED ORDER — PREDNISONE 10 MG PO TABS: 10.0000 mg | ORAL_TABLET | Freq: Every day | ORAL | Status: DC

## 2014-11-05 MED ORDER — COLCHICINE 0.6 MG PO TABS: 0.6000 mg | ORAL_TABLET | Freq: Two times a day (BID) | ORAL | Status: DC

## 2014-11-05 NOTE — Patient Instructions (Signed)
Hold the simvastatin while on the Colchicine; it could raise the level of the statin.   Your next office appointment will be determined based upon review of your pending labs . Those instructions will be transmitted to you by mail for your records.  Critical results will be called.   Followup as needed for any active or acute issue. Please report any significant change in your symptoms.

## 2014-11-05 NOTE — Progress Notes (Signed)
   Subjective:    Patient ID: John Hogan, male    DOB: 03-Nov-1950, 64 y.o.   MRN: [id number]  HPI He describes pain in both thumb/wrist areas, left greater than right since 5/90/16. 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 This includes his feet, hands, and shoulders. He believes that this flare may have been related to excess red meat intake over the last week. He does not drink. He's been on allopurinol 300 mg daily. He took his last 2 pills of colchicine for this attack. Because of insurance coverage he did not have a maintenance prescription of colchicine to be used as needed. He is not on thiazide.  His last uric acid on record was 4.8 in August 2015.  Review of Systems He denies fever, chills, sweats.  There's been no numbness, tingling, weakness in the hands.  There's been no change in color or temperature over the area of pain .      Objective:   Physical Exam  Pertinent or positive findings include: He is tender over the base of the thumb and the medial wrist areas. There is no increased warmth or change in color temperature of the skin in this area.  He has trace ankle edema.  Pedal pulses are slightly decreased but intact.  General appearance :adequately nourished; in no distress. Eyes: No conjunctival inflammation or scleral icterus is present. Heart:  Normal rate and regular rhythm. S1 and S2 normal without gallop, murmur, click, rub or other extra sounds   Lungs:Chest clear to auscultation; no wheezes, rhonchi,rales ,or rubs present.No increased work of breathing.  Vascular : all pulses equal ; no bruits present. Skin:Warm & dry.  Intact without suspicious lesions or rashes ; no tenting  Lymphatic: No lymphadenopathy is noted about the head, neck, axilla Neuro: Strength, tone & DTRs normal.        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

## 2014-11-05 NOTE — Progress Notes (Signed)
Pre visit review using our clinic review tool, if applicable. No additional management support is needed unless otherwise documented below in the visit note. 

## 2014-11-05 NOTE — Telephone Encounter (Signed)
Ok to try change from capsule to tab - done erx

## 2015-02-09 ENCOUNTER — Other Ambulatory Visit: Payer: Self-pay | Admitting: Internal Medicine

## 2015-04-08 ENCOUNTER — Telehealth: Payer: Self-pay | Admitting: *Deleted

## 2015-04-08 MED ORDER — COLCHICINE 0.6 MG PO TABS: 0.6000 mg | ORAL_TABLET | Freq: Two times a day (BID) | ORAL | Status: DC

## 2015-04-08 NOTE — Telephone Encounter (Signed)
Received call pt states he is needing refill on his colchicine. Verified pharmacy inform will send to [facility]...Johny Chess

## 2015-04-23 ENCOUNTER — Ambulatory Visit: Payer: 59

## 2015-04-23 ENCOUNTER — Ambulatory Visit (INDEPENDENT_AMBULATORY_CARE_PROVIDER_SITE_OTHER): Payer: 59 | Admitting: Family

## 2015-04-23 ENCOUNTER — Encounter: Payer: Self-pay | Admitting: Family

## 2015-04-23 VITALS — BP 132/90 | HR 48 | Temp 98.0°F | Ht 71.0 in | Wt 240.5 lb

## 2015-04-23 DIAGNOSIS — Z23 Encounter for immunization: Secondary | ICD-10-CM

## 2015-04-23 DIAGNOSIS — R21 Rash and other nonspecific skin eruption: Secondary | ICD-10-CM | POA: Diagnosis not present

## 2015-04-23 MED ORDER — TRIAMCINOLONE ACETONIDE 0.1 % EX CREA: 1.0000 "application " | TOPICAL_CREAM | Freq: Two times a day (BID) | CUTANEOUS | Status: DC

## 2015-04-23 NOTE — Assessment & Plan Note (Signed)
Rash appears allergic in nature with slight eczematous properties. Start triamcinalone cream. Moisturize with Dove, Aveeno or Eucerin. Follow up if symptoms worsen or fail to improve.

## 2015-04-23 NOTE — Patient Instructions (Signed)
Thank you for choosing [facility].  Summary/Instructions:  Your prescription(s) have been submitted to your pharmacy or been printed and provided for you. Please take as directed and contact our office if you believe you are having problem(s) with the medication(s) or have any questions.  If your symptoms worsen or fail to improve, please contact our office for further instruction, or in case of emergency go directly to the emergency room at the closest medical facility.     

## 2015-04-23 NOTE — Progress Notes (Signed)
Subjective:    Patient ID: John Hogan, male    DOB: 14-Oct-1950, 64 y.o.   MRN: [id number]  Chief Complaint  Patient presents with  . Rash    HPI:  John Hogan is a 64 y.o. male who  has a past medical history of Gout; Hypertension; Hypertensive cardiomyopathy ([place]); H/O exercise stress test; Hyperlipidemia; and AI (aortic insufficiency). and presents today for an acute office visit.   Associated symptom of a rash located on the posterior aspect of his neck that has been going on for about 4 days. Did have an additional location on his abdomen that has improved. Denies any modifying factors or attempted treatments. Describes as red and itchy. Recently went on a cruise and thinks it may be related to the soap.   No Known Allergies   Current Outpatient Prescriptions on File Prior to Visit  Medication Sig Dispense Refill  . allopurinol (ZYLOPRIM) 300 MG tablet Take 1 tablet (300 mg total) by mouth daily. 90 tablet 3  . amLODipine (NORVASC) 10 MG tablet Take 1 tablet (10 mg total) by mouth daily. 90 tablet 3  . colchicine 0.6 MG tablet Take 1 tablet (0.6 mg total) by mouth 2 (two) times daily. 60 tablet 3  . dorzolamide-timolol (COSOPT) 22.3-6.8 MG/ML ophthalmic solution Place 1 drop into both eyes 2 (two) times daily.     Marland Kitchen losartan (COZAAR) 50 MG tablet Take 1 tablet (50 mg total) by mouth daily. 90 tablet 3  . pravastatin (PRAVACHOL) 20 MG tablet Take 1 tablet (20 mg total) by mouth daily. 90 tablet 3  . Travoprost, BAK Free, (TRAVATAN) 0.004 % SOLN ophthalmic solution Place 1 drop into both eyes at bedtime.     Marland Kitchen zolpidem (AMBIEN) 5 MG tablet TAKE 1 TABLET BY MOUTH EVERY NIGHT AT BEDTIME AS NEEDED FOR SLEEP 30 tablet 2   No current facility-administered medications on file prior to visit.     Past Surgical History  Procedure Laterality Date  . Wrist surgery      right  . Hernia repair      Umbilical  . Colonoscopy       Review of Systems  Constitutional: Negative  for fever and chills.  Skin: Positive for rash.      Objective:    BP 132/90 mmHg  Pulse 48  Temp(Src) 98 F (36.7 C) (Oral)  Ht 5\' 11"  (1.803 m)  Wt 240 lb 8 oz (109.09 kg)  BMI 33.56 kg/m2  SpO2 96% Nursing note and vital signs reviewed.  Physical Exam  Constitutional: He is oriented to person, place, and time. He appears well-developed and well-nourished. No distress.  Cardiovascular: Normal rate, regular rhythm, normal heart sounds and intact distal pulses.   Pulmonary/Chest: Effort normal and breath sounds normal.  Neurological: He is alert and oriented to person, place, and time.  Skin: Skin is warm and dry.  Posterior neck with approximately 2-3 inch area of sporadic redness similar to eczema with some scaling and with some scabs consistent with itching.  Psychiatric: He has a normal mood and affect. His behavior is normal. Judgment and thought content normal.       Assessment & Plan:   Problem List Items Addressed This Visit      Musculoskeletal and Integument   Rash and nonspecific skin eruption - Primary    Rash appears allergic in nature with slight eczematous properties. Start triamcinalone cream. Moisturize with Dove, Aveeno or Eucerin. Follow up if symptoms worsen or fail  to improve.       Relevant Medications   triamcinolone cream (KENALOG) 0.1 %     Other   Need for prophylactic vaccination and inoculation against influenza   Relevant Orders   Flu Vaccine QUAD 36+ mos IM (Completed)

## 2015-05-05 ENCOUNTER — Ambulatory Visit (INDEPENDENT_AMBULATORY_CARE_PROVIDER_SITE_OTHER): Payer: 59 | Admitting: Internal Medicine

## 2015-05-05 ENCOUNTER — Other Ambulatory Visit: Payer: Self-pay | Admitting: Internal Medicine

## 2015-05-05 ENCOUNTER — Encounter: Payer: Self-pay | Admitting: Internal Medicine

## 2015-05-05 VITALS — BP 130/66 | HR 41 | Temp 97.8°F | Resp 18 | Wt 235.0 lb

## 2015-05-05 DIAGNOSIS — M546 Pain in thoracic spine: Secondary | ICD-10-CM | POA: Diagnosis not present

## 2015-05-05 MED ORDER — BACLOFEN 10 MG PO TABS: 10.0000 mg | ORAL_TABLET | Freq: Three times a day (TID) | ORAL | Status: DC

## 2015-05-05 NOTE — Progress Notes (Signed)
Subjective:    Patient ID: John Hogan, male    DOB: [birth year], 64 y.o.   MRN: [id number]  HPI He is here for an acute back injury.  He was lifting his dad out of his wheelchair to transfer him to bed and twisted wrong.  He felt his pain in the right lateral aspect of his back.  This occurred two days and it did get better, but now he is getting muscle spasms intermittently since this morning.  The pain can reach an 8/10.  He denies radiation of the pain, numbness/tingling.  He has not tried taken anything for the pain.  Today the pain has been getting worse.  He denies a history of back pain.    Medications and allergies reviewed with patient and updated if appropriate.  Patient Active Problem List   Diagnosis Date Noted  . Rash and nonspecific skin eruption 04/23/2015  . CKD (chronic kidney disease) stage 3, GFR 30-59 ml/min 08/07/2014  . Arthritis of left shoulder region 07/07/2014  . Pain in joint, shoulder region 06/09/2014  . Primary osteoarthritis of left shoulder 06/09/2014  . Abnormal chest x-ray 05/27/2014  . Insomnia 02/14/2014  . Need for prophylactic vaccination and inoculation against influenza 02/27/2013  . Erectile dysfunction 02/27/2013  . Eczema, dyshidrotic 05/16/2012  . Gout 02/16/2012  . Routine general medical examination at a health care facility 02/16/2012  . Hyperlipidemia with target LDL less than 100 02/16/2012  . Aortic regurgitation 02/16/2012  . Colon polyps 02/16/2012  . Essential hypertension, benign 03/07/2009  . Hypertensive heart disease without heart failure 03/07/2009  . COMBINED HEART FAILURE, CHRONIC 03/07/2009    Current Outpatient Prescriptions on File Prior to Visit  Medication Sig Dispense Refill  . allopurinol (ZYLOPRIM) 300 MG tablet Take 1 tablet (300 mg total) by mouth daily. 90 tablet 3  . amLODipine (NORVASC) 10 MG tablet Take 1 tablet (10 mg total) by mouth daily. 90 tablet 3  . colchicine 0.6 MG tablet Take 1 tablet (0.6  mg total) by mouth 2 (two) times daily. 60 tablet 3  . dorzolamide-timolol (COSOPT) 22.3-6.8 MG/ML ophthalmic solution Place 1 drop into both eyes 2 (two) times daily.     Marland Kitchen losartan (COZAAR) 50 MG tablet Take 1 tablet (50 mg total) by mouth daily. 90 tablet 3  . pravastatin (PRAVACHOL) 20 MG tablet Take 1 tablet (20 mg total) by mouth daily. 90 tablet 3  . Travoprost, BAK Free, (TRAVATAN) 0.004 % SOLN ophthalmic solution Place 1 drop into both eyes at bedtime.     . triamcinolone cream (KENALOG) 0.1 % Apply 1 application topically 2 (two) times daily. 30 g 0  . zolpidem (AMBIEN) 5 MG tablet TAKE 1 TABLET BY MOUTH EVERY NIGHT AT BEDTIME AS NEEDED FOR SLEEP 30 tablet 2   No current facility-administered medications on file prior to visit.    Past Medical History  Diagnosis Date  . Gout   . Hypertension   . Hypertensive cardiomyopathy ([place])     EF previously 35%;  echo 12/2012 EF 55%, mild AI, ascending aorta mildly dilated, mild LAE  . H/O exercise stress test     Myoview 01/03/12: No scar or ischemia, EF 53%  . Hyperlipidemia   . AI (aortic insufficiency)     Past Surgical History  Procedure Laterality Date  . Wrist surgery      right  . Hernia repair      Umbilical  . Colonoscopy      Social  History   Social History  . Marital Status: Married    Spouse Name: N/A  . Number of Children: 2  . Years of Education: N/A   Occupational History  .  [employer]   Social History Main Topics  . Smoking status: Former Smoker -- 1.00 packs/day for 2 years    Types: Cigarettes    Quit date: 01/10/1975  . Smokeless tobacco: Never Used     Comment: Only smoked two years  . Alcohol Use: No  . Drug Use: No  . Sexual Activity: Yes   Other Topics Concern  . None   Social History Narrative   Lives at home with wife.    Review of Systems See HPI    Objective:   Filed Vitals:   05/05/15 1113  BP: 130/66  Pulse: 41  Temp: 97.8 F (36.6 C)  Resp: 18   Filed Weights    05/05/15 1113  Weight: 235 lb (106.595 kg)   Body mass index is 32.79 kg/(m^2).   Physical Exam  Constitutional: He appears well-developed and well-nourished. No distress.  Musculoskeletal: He exhibits no edema.  No tenderness along thoracic or lumbar spines.  Tenderness lateral aspect of right mid back, no rib deformity or focal tenderness  Neurological:  Normal gait, normal sensation and strength in all extremities          Assessment & Plan:   Acute back pain, muscular in nature, muscle spasms Related to injury lifting his father Unable to take nsaids due to CKD Apply heat Will do a low dose muscle relaxer/anti-spastic medication - needs to be low dose due to CKD and ideally something that does not make him drowsy Will try baclofen 10 mg TID prn  Call if no improvement

## 2015-05-05 NOTE — Progress Notes (Signed)
Pre visit review using our clinic review tool, if applicable. No additional management support is needed unless otherwise documented below in the visit note. 

## 2015-05-05 NOTE — Patient Instructions (Signed)
Your pain is muscular in nature.  Apply heat and take the muscle relaxer as prescribed. If you have any side effects please call.  Hopefully, your symptoms will resolve within a few days - if they do not please call.

## 2015-05-18 ENCOUNTER — Other Ambulatory Visit: Payer: Self-pay | Admitting: Internal Medicine

## 2015-07-06 ENCOUNTER — Ambulatory Visit (INDEPENDENT_AMBULATORY_CARE_PROVIDER_SITE_OTHER): Payer: 59 | Admitting: Internal Medicine

## 2015-07-06 ENCOUNTER — Encounter: Payer: Self-pay | Admitting: Internal Medicine

## 2015-07-06 ENCOUNTER — Other Ambulatory Visit (INDEPENDENT_AMBULATORY_CARE_PROVIDER_SITE_OTHER): Payer: 59

## 2015-07-06 VITALS — BP 146/80 | HR 68 | Temp 97.6°F | Resp 16 | Ht 71.0 in | Wt 238.0 lb

## 2015-07-06 DIAGNOSIS — M545 Low back pain, unspecified: Secondary | ICD-10-CM

## 2015-07-06 DIAGNOSIS — I10 Essential (primary) hypertension: Secondary | ICD-10-CM | POA: Diagnosis not present

## 2015-07-06 LAB — BASIC METABOLIC PANEL
BUN: 18 mg/dL (ref 6–23)
CALCIUM: 10.1 mg/dL (ref 8.4–10.5)
CO2: 32 mEq/L (ref 19–32)
Chloride: 105 mEq/L (ref 96–112)
Creatinine, Ser: 1.5 mg/dL (ref 0.40–1.50)
GFR: 60.27 mL/min (ref >60.00)
Glucose, Bld: 97 mg/dL (ref 70–99)
Potassium: 5.1 mEq/L (ref 3.5–5.1)
SODIUM: 143 meq/L (ref 135–145)

## 2015-07-06 LAB — CBC WITH DIFFERENTIAL/PLATELET
BASOS PCT: 1 % (ref 0.0–3.0)
Basophils Absolute: 0 10*3/uL (ref 0.0–0.1)
EOS PCT: 11.2 % — AB (ref 0.0–5.0)
Eosinophils Absolute: 0.5 10*3/uL (ref 0.0–0.7)
HCT: 45 % (ref 39.0–52.0)
Hemoglobin: 14.9 g/dL (ref 13.0–17.0)
LYMPHS ABS: 1.8 10*3/uL (ref 0.7–4.0)
Lymphocytes Relative: 39.1 % (ref 12.0–46.0)
MCHC: 33.1 g/dL (ref 30.0–36.0)
MCV: 92.5 fl (ref 78.0–100.0)
MONO ABS: 0.3 10*3/uL (ref 0.1–1.0)
Monocytes Relative: 6.9 % (ref 3.0–12.0)
NEUTROS PCT: 41.8 % — AB (ref 43.0–77.0)
Neutro Abs: 1.9 10*3/uL (ref 1.4–7.7)
PLATELETS: 231 10*3/uL (ref 150.0–400.0)
RBC: 4.86 Mil/uL (ref 4.22–5.81)
RDW: 13.8 % (ref 11.5–15.5)
WBC: 4.6 10*3/uL (ref 4.0–10.5)

## 2015-07-06 MED ORDER — METHOCARBAMOL 750 MG PO TABS: 750.0000 mg | ORAL_TABLET | Freq: Three times a day (TID) | ORAL | Status: DC | PRN

## 2015-07-06 MED ORDER — MELOXICAM 15 MG PO TABS: 15.0000 mg | ORAL_TABLET | Freq: Every day | ORAL | Status: DC

## 2015-07-06 MED ORDER — KETOROLAC TROMETHAMINE 60 MG/2ML IM SOLN
60.0000 mg | Freq: Once | INTRAMUSCULAR | Status: AC
  Administered 2015-07-06: 60 mg via INTRAMUSCULAR

## 2015-07-06 NOTE — Patient Instructions (Signed)

## 2015-07-06 NOTE — Progress Notes (Signed)
Subjective:  Patient ID: John Hogan, male    DOB: 1951/02/07  Age: 65 y.o. MRN: [id number]  CC: Back Pain   HPI John Hogan presents for a 2 day history of pain in his right lower back after shoveling some snow. He denies any specific trauma or injury. He describes a throbbing sensation that is exacerbated when he goes from a sitting to a standing position. The pain does not radiate. He is not taking anything for the pain.  Outpatient Prescriptions Prior to Visit  Medication Sig Dispense Refill  . allopurinol (ZYLOPRIM) 300 MG tablet Take 1 tablet (300 mg total) by mouth daily. 90 tablet 3  . amLODipine (NORVASC) 10 MG tablet Take 1 tablet (10 mg total) by mouth daily. 90 tablet 3  . baclofen (LIORESAL) 10 MG tablet Take 1 tablet (10 mg total) by mouth 3 (three) times daily. 15 each 0  . colchicine 0.6 MG tablet Take 1 tablet (0.6 mg total) by mouth 2 (two) times daily. 60 tablet 3  . dorzolamide-timolol (COSOPT) 22.3-6.8 MG/ML ophthalmic solution Place 1 drop into both eyes 2 (two) times daily.     Marland Kitchen losartan (COZAAR) 50 MG tablet Take 1 tablet (50 mg total) by mouth daily. 90 tablet 3  . pravastatin (PRAVACHOL) 20 MG tablet Take 1 tablet (20 mg total) by mouth daily. 90 tablet 3  . Travoprost, BAK Free, (TRAVATAN) 0.004 % SOLN ophthalmic solution Place 1 drop into both eyes at bedtime.     . triamcinolone cream (KENALOG) 0.1 % Apply 1 application topically 2 (two) times daily. 30 g 0  . zolpidem (AMBIEN) 5 MG tablet TAKE 1 TABLET BY MOUTH EVERY NIGHT AT BEDTIME AS NEEDED FOR SLEEP 30 tablet 1   No facility-administered medications prior to visit.    ROS Review of Systems  Constitutional: Negative.  Negative for fever, chills, diaphoresis, appetite change and fatigue.  HENT: Negative.   Eyes: Negative.   Respiratory: Negative.  Negative for cough, choking, chest tightness, shortness of breath and stridor.   Cardiovascular: Negative.  Negative for chest pain, palpitations  and leg swelling.  Gastrointestinal: Negative.  Negative for nausea, vomiting, abdominal pain, diarrhea, constipation and blood in stool.  Endocrine: Negative.   Genitourinary: Negative.  Negative for difficulty urinating.  Musculoskeletal: Positive for back pain. Negative for myalgias, joint swelling, arthralgias, gait problem, neck pain and neck stiffness.  Skin: Negative.  Negative for color change and rash.  Allergic/Immunologic: Negative.   Neurological: Negative.  Negative for dizziness, weakness, light-headedness, numbness and headaches.  Hematological: Negative.  Negative for adenopathy. Does not bruise/bleed easily.  Psychiatric/Behavioral: Negative.     Objective:  Ht 5\' 11"  (1.803 m)  BP Readings from Last 3 Encounters:  05/05/15 130/66  04/23/15 132/90  11/05/14 140/80    Wt Readings from Last 3 Encounters:  05/05/15 235 lb (106.595 kg)  04/23/15 240 lb 8 oz (109.09 kg)  11/05/14 242 lb 8 oz (109.997 kg)    Physical Exam  Constitutional: He is oriented to person, place, and time.  Non-toxic appearance. He does not have a sickly appearance. He does not appear ill. No distress.  HENT:  Head: Normocephalic and atraumatic.  Mouth/Throat: Oropharynx is clear and moist. No oropharyngeal exudate.  Eyes: Conjunctivae are normal. Right eye exhibits no discharge. Left eye exhibits no discharge. No scleral icterus.  Neck: Normal range of motion. Neck supple. No JVD present. No tracheal deviation present. No thyromegaly present.  Cardiovascular: Normal rate, regular rhythm,  normal heart sounds and intact distal pulses.  Exam reveals no gallop and no friction rub.   No murmur heard. Pulmonary/Chest: Effort normal and breath sounds normal. No stridor. No respiratory distress. He has no wheezes. He has no rales. He exhibits no tenderness.  Abdominal: Soft. Bowel sounds are normal. He exhibits no distension and no mass. There is no tenderness. There is no rebound and no guarding.    Musculoskeletal: Normal range of motion. He exhibits no edema or tenderness.       Lumbar back: Normal. He exhibits normal range of motion, no tenderness, no bony tenderness, no swelling, no edema, no deformity and no pain.  Lymphadenopathy:    He has no cervical adenopathy.  Neurological: He is alert and oriented to person, place, and time. He has normal strength. He displays no atrophy, no tremor and normal reflexes. No cranial nerve deficit or sensory deficit. He exhibits normal muscle tone. He displays a negative Romberg sign. He displays no seizure activity. Coordination normal.  Reflex Scores:      Tricep reflexes are 1+ on the right side and 1+ on the left side.      Bicep reflexes are 1+ on the right side and 1+ on the left side.      Brachioradialis reflexes are 1+ on the right side and 1+ on the left side.      Patellar reflexes are 1+ on the right side and 1+ on the left side.      Achilles reflexes are 1+ on the right side and 1+ on the left side. Neg SLR in BLE  Skin: Skin is warm and dry. No rash noted. He is not diaphoretic. No erythema. No pallor.  Vitals reviewed.   Lab Results  Component Value Date   WBC 4.2 08/06/2014   HGB 15.3 08/06/2014   HCT 45.4 08/06/2014   PLT 211.0 08/06/2014   GLUCOSE 85 08/06/2014   CHOL 199 08/06/2014   TRIG 73.0 08/06/2014   HDL 70.90 08/06/2014   LDLDIRECT 140.7 02/27/2013   LDLCALC 114* 08/06/2014   ALT 35 08/06/2014   AST 22 08/06/2014   NA 143 08/06/2014   K 3.8 08/06/2014   CL 108 08/06/2014   CREATININE 1.83* 08/06/2014   BUN 28* 08/06/2014   CO2 26 08/06/2014   TSH 1.34 08/06/2014   PSA 0.81 08/06/2014    US Renal  08/14/2014  CLINICAL DATA:  Renal insufficiency, history of hypertension EXAM: RENAL/URINARY TRACT ULTRASOUND COMPLETE COMPARISON:  None. FINDINGS: Right Kidney: Length: 11.0 cm. Increased renal cortical echogenicity without hydronephrosis. Right mid renal cortical cyst, 1.6 x 1.6 x 1.4 cm Left Kidney:  Length: 11.9 cm. Increased renal cortical echogenicity without hydronephrosis. Left mid/inferior simple appearing cyst, 8.5 x 4.4 x 3.8 cm. Bladder: Appears normal for degree of bladder distention. IMPRESSION: Increased renal cortical echogenicity compatible with medical renal disease. No hydronephrosis. Bilateral renal cortical cysts. Electronically Signed   By: Conchita Paris M.D.   On: 08/14/2014 19:35    Assessment & Plan:   Aizik was seen today for back pain.  Diagnoses and all orders for this visit:  Essential hypertension, benign- his blood pressure is well-controlled, electrolytes and renal function are stable. -     CBC with Differential/Platelet; Future -     Basic metabolic panel; Future  Right-sided low back pain without sciatica- he has mechanical low back pain with no signs or symptoms of radiculitis. To reduce his pain he was given an injection of Toradol in  the office. Will continue pain relief with meloxicam and methocarbamol. -     meloxicam (MOBIC) 15 MG tablet; Take 1 tablet (15 mg total) by mouth daily. -     methocarbamol (ROBAXIN-750) 750 MG tablet; Take 1 tablet (750 mg total) by mouth every 8 (eight) hours as needed for muscle spasms.  I am having Mr. Southwood start on meloxicam and methocarbamol. I am also having him maintain his Travoprost (BAK Free), dorzolamide-timolol, losartan, allopurinol, amLODipine, pravastatin, colchicine, triamcinolone cream, baclofen, and zolpidem.  Meds ordered this encounter  Medications  . meloxicam (MOBIC) 15 MG tablet    Sig: Take 1 tablet (15 mg total) by mouth daily.    Dispense:  30 tablet    Refill:  0  . methocarbamol (ROBAXIN-750) 750 MG tablet    Sig: Take 1 tablet (750 mg total) by mouth every 8 (eight) hours as needed for muscle spasms.    Dispense:  90 tablet    Refill:  0     Follow-up: Return in about 4 weeks (around 08/03/2015).  Scarlette Calico, MD

## 2015-07-09 ENCOUNTER — Ambulatory Visit (INDEPENDENT_AMBULATORY_CARE_PROVIDER_SITE_OTHER): Payer: 59 | Admitting: Cardiology

## 2015-07-09 ENCOUNTER — Encounter: Payer: Self-pay | Admitting: Cardiology

## 2015-07-09 VITALS — BP 142/82 | HR 46 | Ht 71.0 in | Wt 235.3 lb

## 2015-07-09 DIAGNOSIS — I119 Hypertensive heart disease without heart failure: Secondary | ICD-10-CM

## 2015-07-09 NOTE — Progress Notes (Signed)
HPI The patient presents for followup of aortic insufficiency. He has had previously reduced ejection fraction but it has been improved a followup. The last echo was 2015.  Aortic insufficiency has been moderate but was mild more recently.  He presents for routine followup. He says he's been doing relatively well.   The patient denies any new symptoms such as chest discomfort, neck or arm discomfort. There has been no new shortness of breath, PND or orthopnea. There have been no reported palpitations, presyncope or syncope.  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   No Known Allergies  Current Outpatient Prescriptions  Medication Sig Dispense Refill  . allopurinol (ZYLOPRIM) 300 MG tablet Take 1 tablet (300 mg total) by mouth daily. 90 tablet 3  . amLODipine (NORVASC) 10 MG tablet Take 1 tablet (10 mg total) by mouth daily. 90 tablet 3  . baclofen (LIORESAL) 10 MG tablet Take 1 tablet (10 mg total) by mouth 3 (three) times daily. 15 each 0  . colchicine 0.6 MG tablet Take 1 tablet (0.6 mg total) by mouth 2 (two) times daily. 60 tablet 3  . dorzolamide-timolol (COSOPT) 22.3-6.8 MG/ML ophthalmic solution Place 1 drop into both eyes 2 (two) times daily.     Marland Kitchen losartan (COZAAR) 50 MG tablet Take 1 tablet (50 mg total) by mouth daily. 90 tablet 3  . meloxicam (MOBIC) 15 MG tablet Take 1 tablet (15 mg total) by mouth daily. 30 tablet 0  . methocarbamol (ROBAXIN-750) 750 MG tablet Take 1 tablet (750 mg total) by mouth every 8 (eight) hours as needed for muscle spasms. 90 tablet 0  . pravastatin (PRAVACHOL) 20 MG tablet Take 1 tablet (20 mg total) by mouth daily. 90 tablet 3  . Travoprost, BAK Free, (TRAVATAN) 0.004 % SOLN ophthalmic solution Place 1 drop into both eyes at bedtime.     . triamcinolone cream (KENALOG) 0.1 % Apply 1 application topically 2 (two) times daily. 30 g 0  . zolpidem (AMBIEN) 5 MG tablet TAKE 1 TABLET BY MOUTH EVERY NIGHT AT BEDTIME AS NEEDED FOR  SLEEP 30 tablet 1   No current facility-administered medications for this visit.    Past Medical History  Diagnosis Date  . Gout   . Hypertension   . Hypertensive cardiomyopathy ([place])     EF previously 35%;  echo 12/2012 EF 55%, mild AI, ascending aorta mildly dilated, mild LAE  . H/O exercise stress test     Myoview 01/03/12: No scar or ischemia, EF 53%  . Hyperlipidemia   . AI (aortic insufficiency)     Past Surgical History  Procedure Laterality Date  . Wrist surgery      right  . Hernia repair      Umbilical  . Colonoscopy      ROS:  Positive for erectile dysfunction. Otherwise as stated in the HPI and negative for all other systems.  PHYSICAL EXAM BP 142/82 mmHg  Pulse 46  Ht 5\' 11"  (1.803 m)  Wt 235 lb 4.8 oz (106.731 kg)  BMI 32.83 kg/m2 GENERAL:  Well appearing HEENT:  Pupils equal round and reactive, fundi not visualized, oral mucosa unremarkable, dentures NECK:  No jugular venous distention, waveform within normal limits, carotid upstroke brisk and symmetric, no bruits, no thyromegaly LUNGS:  Clear to auscultation bilaterally CHEST:  Unremarkable HEART:  PMI not displaced or sustained,S1 and S2 within normal limits, no S3, no S4, no clicks, no rubs, 2/6 diastolic murmur  heard best at the left sternal border midsternal during full expiration, no diastolic murmurs ABD:  Flat, positive bowel sounds normal in frequency in pitch, no bruits, no rebound, no guarding, no midline pulsatile mass, no hepatomegaly, no splenomegaly EXT:  2 plus pulses throughout, no edema, no cyanosis no clubbing  EKG:    Sinus bradycardia, rate 46 , leftward axis , intervals within normal limits, no acute ST T wave changes. 07/09/2015   ASSESSMENT AND PLAN  Aortic insufficiency - This has been mild on echo last year.  There has been no change clinically.  I will not repeat an echo this year.    HTN - The blood pressure is at target. No change in medications is indicated. We will continue  with therapeutic lifestyle changes (TLC).  Cardiomyopathy - He no longer carries this diagnosis as his EF has been well preserved. No change in therapy is indicated.  HTN:- His BP is borderline and he needs to lose weight to get to target.

## 2015-07-09 NOTE — Patient Instructions (Signed)
Medication Instructions:  Your physician recommends that you continue on your current medications as directed. Please refer to the Current Medication list given to you today.   Labwork: none  Testing/Procedures: none  Follow-Up: Your physician wants you to follow-up in: 12 months with Dr. Percival Spanish.  You will receive a reminder letter in the mail two months in advance. If you don't receive a letter, please call our office to schedule the follow-up appointment.   Any Other Special Instructions Will Be Listed Below (If Applicable).     If you need a refill on your cardiac medications before your next appointment, please call your pharmacy.

## 2015-07-21 ENCOUNTER — Other Ambulatory Visit: Payer: Self-pay | Admitting: Internal Medicine

## 2015-07-23 NOTE — Telephone Encounter (Signed)
MD out of office pls advise on refill.../lmb 

## 2015-07-23 NOTE — Telephone Encounter (Signed)
Faxed script back to [facility].../lmb 

## 2015-08-14 ENCOUNTER — Encounter: Payer: Self-pay | Admitting: Family

## 2015-08-14 ENCOUNTER — Ambulatory Visit (INDEPENDENT_AMBULATORY_CARE_PROVIDER_SITE_OTHER): Payer: 59 | Admitting: Family

## 2015-08-14 VITALS — BP 140/74 | HR 40 | Temp 97.6°F | Resp 14 | Ht 71.0 in | Wt 235.0 lb

## 2015-08-14 DIAGNOSIS — R21 Rash and other nonspecific skin eruption: Secondary | ICD-10-CM | POA: Diagnosis not present

## 2015-08-14 MED ORDER — TRIAMCINOLONE ACETONIDE 0.1 % EX CREA: 1.0000 "application " | TOPICAL_CREAM | Freq: Two times a day (BID) | CUTANEOUS | Status: DC

## 2015-08-14 NOTE — Progress Notes (Signed)
Pre visit review using our clinic review tool, if applicable. No additional management support is needed unless otherwise documented below in the visit note. 

## 2015-08-14 NOTE — Patient Instructions (Signed)
Thank you for choosing [facility].  Summary/Instructions:  Your prescription(s) have been submitted to your pharmacy or been printed and provided for you. Please take as directed and contact our office if you believe you are having problem(s) with the medication(s) or have any questions.  If your symptoms worsen or fail to improve, please contact our office for further instruction, or in case of emergency go directly to the emergency room at the closest medical facility.   Eczema Eczema, also called atopic dermatitis, is a skin disorder that causes inflammation of the skin. It causes a red rash and dry, scaly skin. The skin becomes very itchy. Eczema is generally worse during the cooler winter months and often improves with the warmth of summer. Eczema usually starts showing signs in infancy. Some children outgrow eczema, but it may last through adulthood.  CAUSES  The exact cause of eczema is not known, but it appears to run in families. People with eczema often have a family history of eczema, allergies, asthma, or hay fever. Eczema is not contagious. Flare-ups of the condition may be caused by:   Contact with something you are sensitive or allergic to.   Stress. SIGNS AND SYMPTOMS  Dry, scaly skin.   Red, itchy rash.   Itchiness. This may occur before the skin rash and may be very intense.  DIAGNOSIS  The diagnosis of eczema is usually made based on symptoms and medical history. TREATMENT  Eczema cannot be cured, but symptoms usually can be controlled with treatment and other strategies. A treatment plan might include:  Controlling the itching and scratching.   Use over-the-counter antihistamines as directed for itching. This is especially useful at night when the itching tends to be worse.   Use over-the-counter steroid creams as directed for itching.   Avoid scratching. Scratching makes the rash and itching worse. It may also result in a skin infection  (impetigo) due to a break in the skin caused by scratching.   Keeping the skin well moisturized with creams every day. This will seal in moisture and help prevent dryness. Lotions that contain alcohol and water should be avoided because they can dry the skin.   Limiting exposure to things that you are sensitive or allergic to (allergens).   Recognizing situations that cause stress.   Developing a plan to manage stress.  HOME CARE INSTRUCTIONS   Only take over-the-counter or prescription medicines as directed by your health care provider.   Do not use anything on the skin without checking with your health care provider.   Keep baths or showers short (5 minutes) in warm (not hot) water. Use mild cleansers for bathing. These should be unscented. You may add nonperfumed bath oil to the bath water. It is best to avoid soap and bubble bath.   Immediately after a bath or shower, when the skin is still damp, apply a moisturizing ointment to the entire body. This ointment should be a petroleum ointment. This will seal in moisture and help prevent dryness. The thicker the ointment, the better. These should be unscented.   Keep fingernails cut short. Children with eczema may need to wear soft gloves or mittens at night after applying an ointment.   Dress in clothes made of cotton or cotton blends. Dress lightly, because heat increases itching.   A child with eczema should stay away from anyone with fever blisters or cold sores. The virus that causes fever blisters (herpes simplex) can cause a serious skin infection in children with  eczema. SEEK MEDICAL CARE IF:   Your itching interferes with sleep.   Your rash gets worse or is not better within 1 week after starting treatment.   You see pus or soft yellow scabs in the rash area.   You have a fever.   You have a rash flare-up after contact with someone who has fever blisters.    This information is not intended to replace  advice given to you by your health care provider. Make sure you discuss any questions you have with your health care provider.   Document Released: 06/10/2000 Document Revised: 04/03/2013 Document Reviewed: 01/14/2013 Elsevier Interactive Patient Education [employer].

## 2015-08-14 NOTE — Assessment & Plan Note (Signed)
Symptoms and exam consistent with eczema. Start triamcinalone. Moisturize with Aveeno, Dove or Eucerin product. Follow up if symptoms worsen or fail to improve.

## 2015-08-14 NOTE — Progress Notes (Signed)
Subjective:    Patient ID: John Hogan, male    DOB: [birth year], 65 y.o.   MRN: [id number]  Chief Complaint  Patient presents with  . Rash    has a rash on right shoulder and part of his neck, x2 days itching and pain    HPI:  John Hogan is a 65 y.o. male who  has a past medical history of Gout; Hypertension; Hypertensive cardiomyopathy ([place]); H/O exercise stress test; Hyperlipidemia; and AI (aortic insufficiency). and presents today for an acute office visit.  This is a new problem. Associated symptom of a rash located on his right shoulder and part of his neck has been going on for approximately 2 days. Described as burning with pruritus. Denies changes in soaps, detergents, clothing, she is, or medications. No fevers. Denies modifying factors that make it better or worse.   No Known Allergies   Current Outpatient Prescriptions on File Prior to Visit  Medication Sig Dispense Refill  . allopurinol (ZYLOPRIM) 300 MG tablet Take 1 tablet (300 mg total) by mouth daily. 90 tablet 3  . amLODipine (NORVASC) 10 MG tablet Take 1 tablet (10 mg total) by mouth daily. 90 tablet 3  . baclofen (LIORESAL) 10 MG tablet Take 1 tablet (10 mg total) by mouth 3 (three) times daily. 15 each 0  . colchicine 0.6 MG tablet Take 1 tablet (0.6 mg total) by mouth 2 (two) times daily. 60 tablet 3  . dorzolamide-timolol (COSOPT) 22.3-6.8 MG/ML ophthalmic solution Place 1 drop into both eyes 2 (two) times daily.     Marland Kitchen losartan (COZAAR) 50 MG tablet Take 1 tablet (50 mg total) by mouth daily. 90 tablet 3  . pravastatin (PRAVACHOL) 20 MG tablet Take 1 tablet (20 mg total) by mouth daily. 90 tablet 3  . Travoprost, BAK Free, (TRAVATAN) 0.004 % SOLN ophthalmic solution Place 1 drop into both eyes at bedtime.     Marland Kitchen zolpidem (AMBIEN) 5 MG tablet TAKE 1 TABLET BY MOUTH AT BEDTIME AS NEEDED FOR SLEEP 30 tablet 0   No current facility-administered medications on file prior to visit.    Review of Systems    Constitutional: Negative for fever and chills.  Skin: Positive for rash.      Objective:    BP 140/74 mmHg  Pulse 40  Temp(Src) 97.6 F (36.4 C) (Oral)  Resp 14  Ht 5\' 11"  (1.803 m)  Wt 235 lb (106.595 kg)  BMI 32.79 kg/m2  SpO2 95% Nursing note and vital signs reviewed.  Physical Exam  Constitutional: He is oriented to person, place, and time. He appears well-developed and well-nourished. No distress.  Cardiovascular: Normal rate, regular rhythm, normal heart sounds and intact distal pulses.   Pulmonary/Chest: Effort normal and breath sounds normal.  Neurological: He is alert and oriented to person, place, and time.  Skin: Skin is warm and dry.  Scaly, reddened and partially excoriated with defined borders and discharge or vesicles located on his posterior neck, right shoulder and bilateral antecubital fossa.   Psychiatric: He has a normal mood and affect. His behavior is normal. Judgment and thought content normal.       Assessment & Plan:   Problem List Items Addressed This Visit      Musculoskeletal and Integument   Rash and nonspecific skin eruption - Primary    Symptoms and exam consistent with eczema. Start triamcinalone. Moisturize with Aveeno, Dove or Eucerin product. Follow up if symptoms worsen or fail to improve.  Relevant Medications   triamcinolone cream (KENALOG) 0.1 %

## 2015-08-27 ENCOUNTER — Other Ambulatory Visit: Payer: Self-pay | Admitting: Internal Medicine

## 2015-08-27 NOTE — Telephone Encounter (Signed)
Please advise, thanks.

## 2015-08-27 NOTE — Telephone Encounter (Signed)
faxed

## 2015-08-27 NOTE — Telephone Encounter (Signed)
Forwarding to PCP.

## 2015-09-08 ENCOUNTER — Other Ambulatory Visit: Payer: Self-pay | Admitting: Internal Medicine

## 2015-11-02 ENCOUNTER — Other Ambulatory Visit: Payer: Self-pay | Admitting: Internal Medicine

## 2015-11-02 ENCOUNTER — Other Ambulatory Visit: Payer: Self-pay

## 2015-11-02 MED ORDER — LOSARTAN POTASSIUM 50 MG PO TABS: 50.0000 mg | ORAL_TABLET | Freq: Every day | ORAL | Status: DC

## 2015-11-15 ENCOUNTER — Other Ambulatory Visit: Payer: Self-pay | Admitting: Internal Medicine

## 2015-12-25 ENCOUNTER — Other Ambulatory Visit: Payer: Self-pay | Admitting: Internal Medicine

## 2015-12-28 NOTE — Telephone Encounter (Signed)
Faxed script back to [facility].../lmb 

## 2016-02-09 IMAGING — US US RENAL
1 series · 14 of 25 positions shown · non-contrast
Comparison: None.

CLINICAL DATA: Renal insufficiency, history of hypertension

EXAM:
RENAL/URINARY TRACT ULTRASOUND COMPLETE

[Series 1: us renal · 0.32mm/px · 14 of 34 slices shown]
[im 1/34]
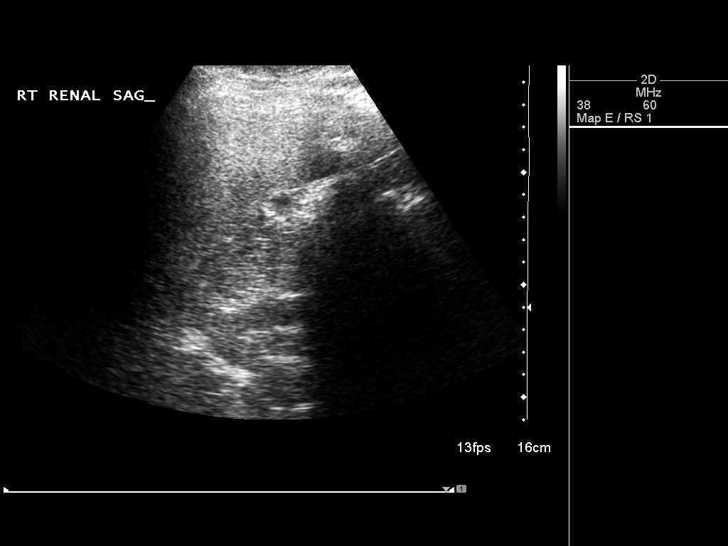
[im 3/34]
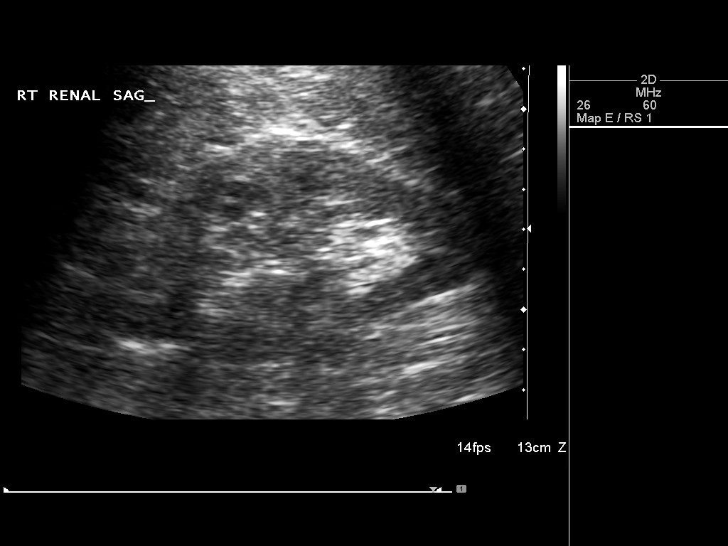
[im 6/34]
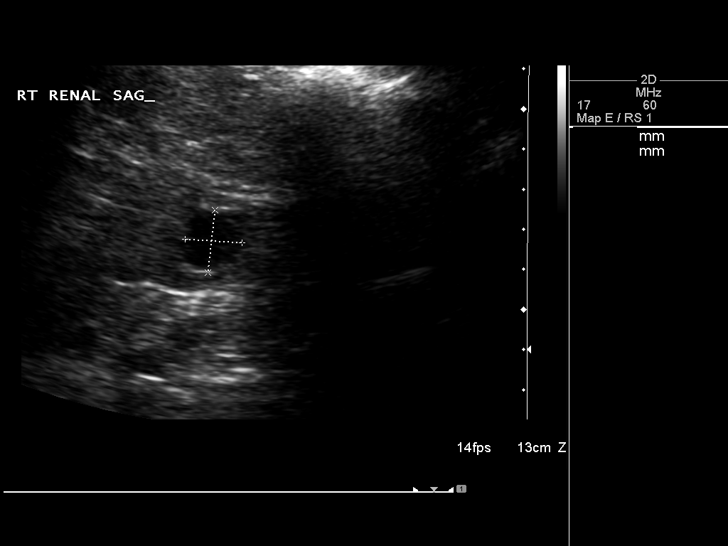
[im 9/34]
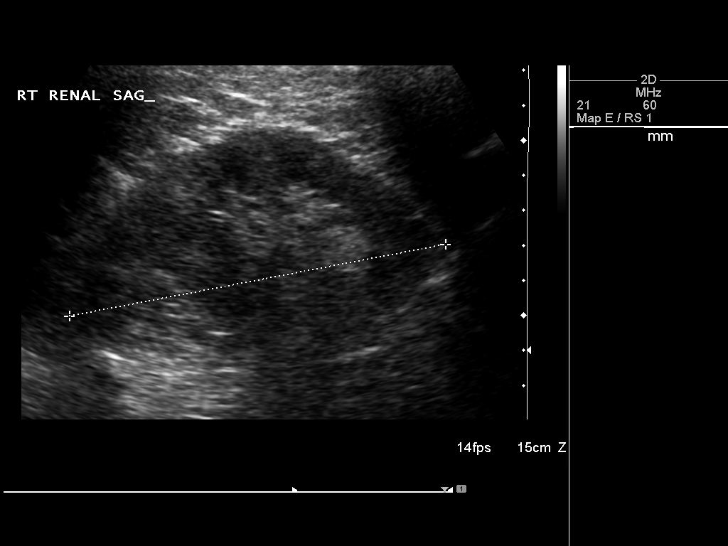
[im 12/34]
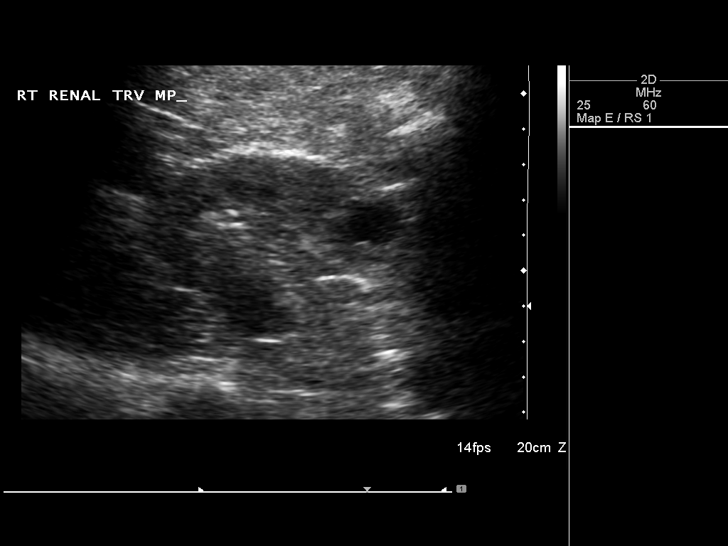
[im 13/34]
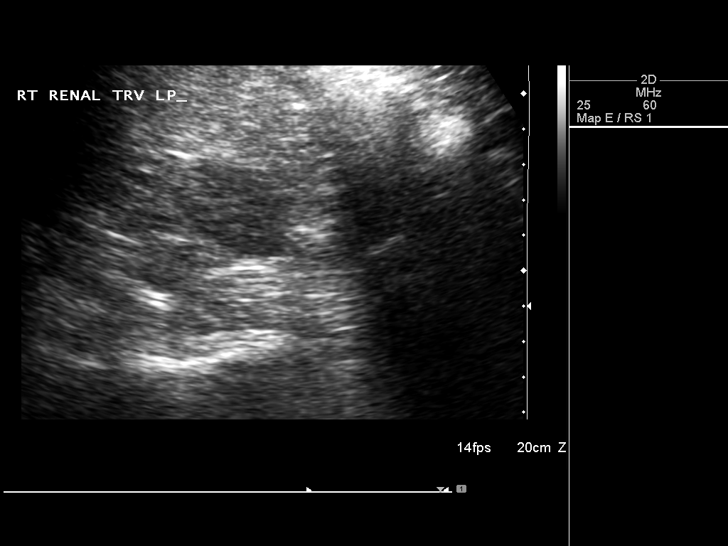
[im 16/34]
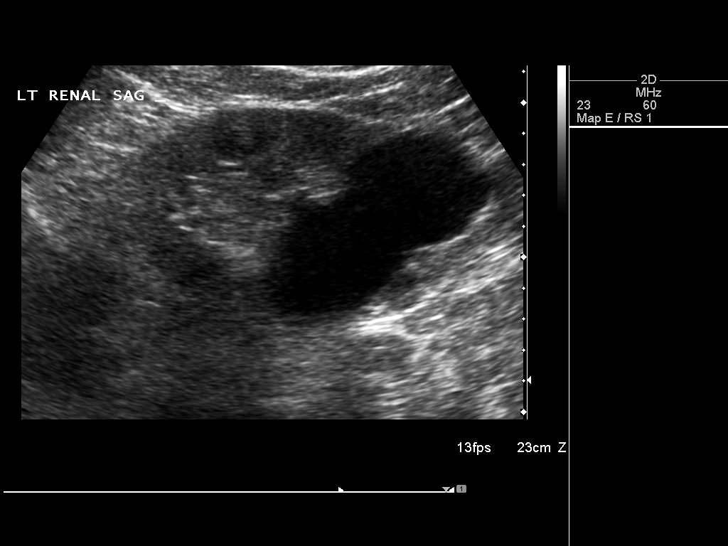
[im 18/34]
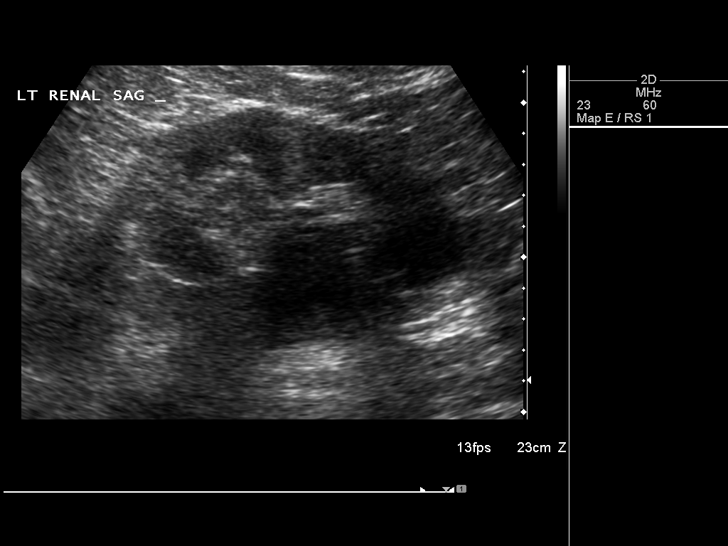
[im 21/34]
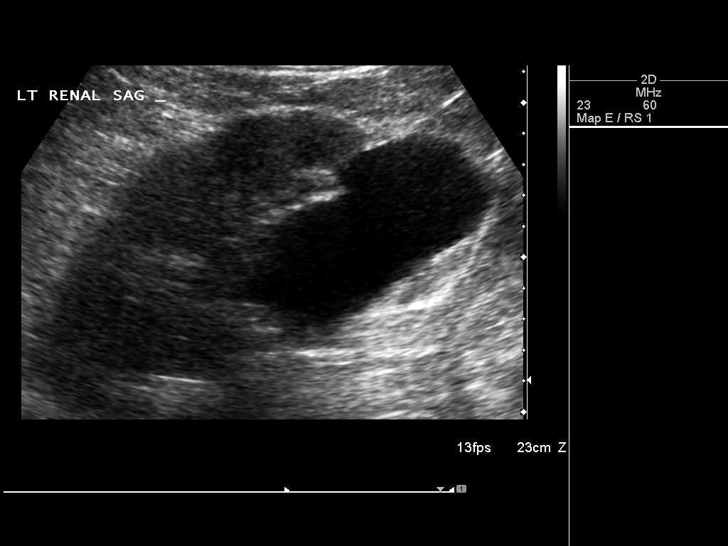
[im 23/34]
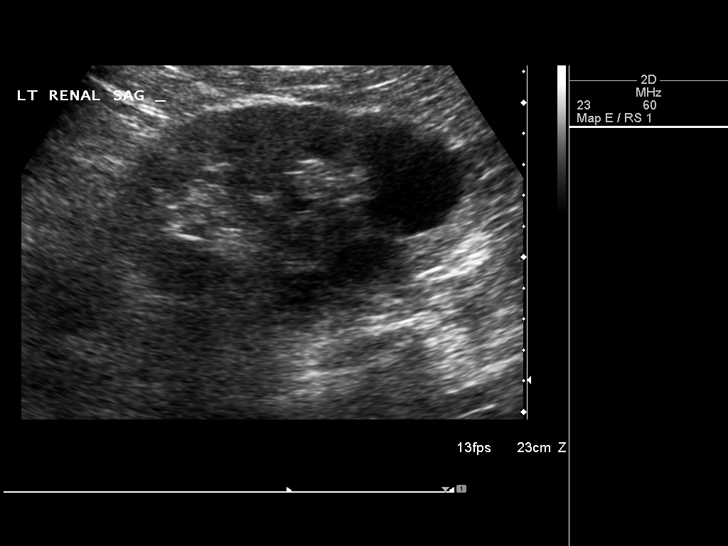
[im 25/34]
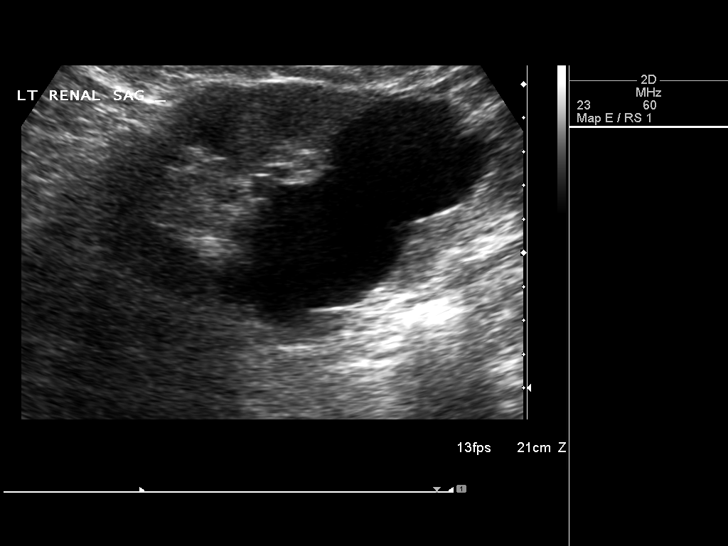
[im 28/34]
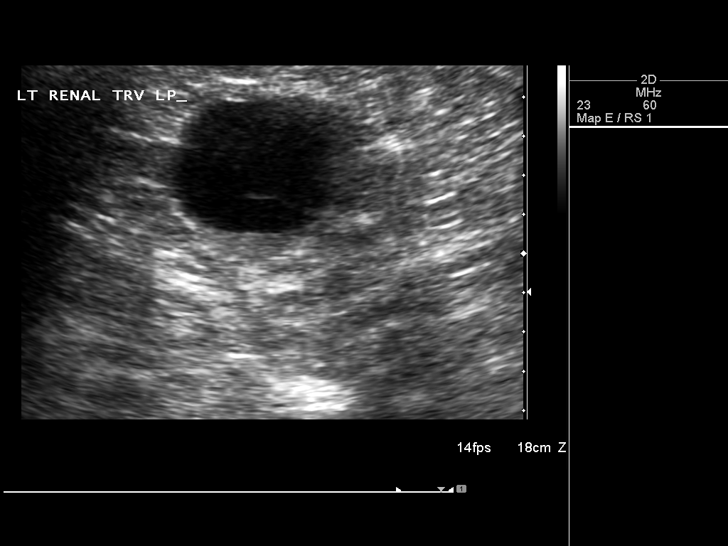
[im 31/34]
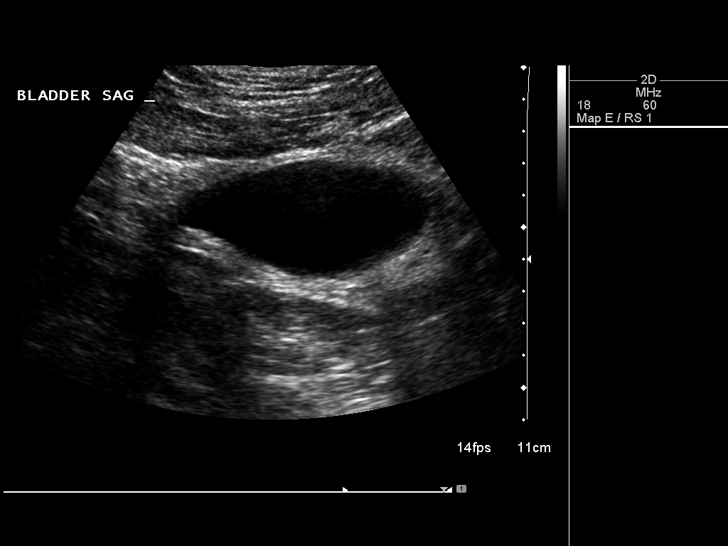
[im 34/34]
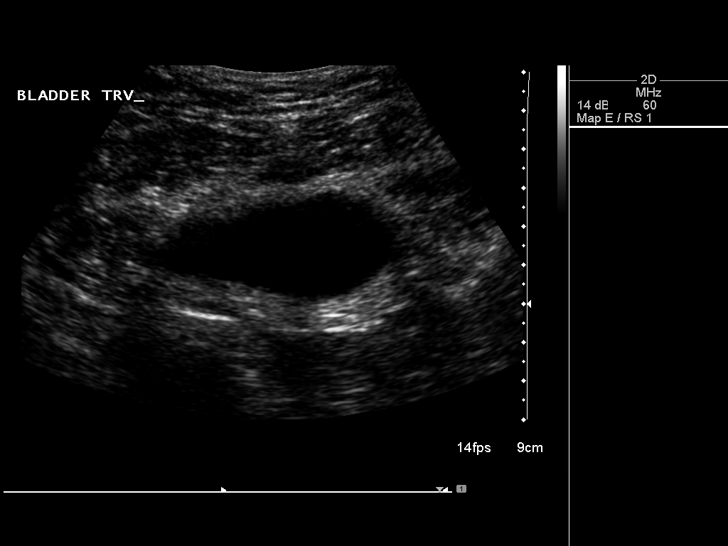

[14 of 25 positions shown; findings below may reference images not displayed]

FINDINGS: Right Kidney:

Length: 11.0 cm. Increased renal cortical echogenicity without
hydronephrosis. Right mid renal cortical cyst, 1.6 x 1.6 x 1.4 cm

Left Kidney:

Length: 11.9 cm. Increased renal cortical echogenicity without
hydronephrosis. Left mid/inferior simple appearing cyst, 8.5 x 4.4 x
3.8 cm.

Bladder:

Appears normal for degree of bladder distention.
IMPRESSION: Increased renal cortical echogenicity compatible with medical renal
disease. No hydronephrosis.

Bilateral renal cortical cysts.

## 2016-05-02 ENCOUNTER — Other Ambulatory Visit: Payer: Self-pay | Admitting: Internal Medicine

## 2016-05-02 NOTE — Telephone Encounter (Signed)
Faxed script back [facility]...John Hogan

## 2016-05-09 ENCOUNTER — Ambulatory Visit (INDEPENDENT_AMBULATORY_CARE_PROVIDER_SITE_OTHER): Payer: 59 | Admitting: Internal Medicine

## 2016-05-09 ENCOUNTER — Encounter: Payer: Self-pay | Admitting: Internal Medicine

## 2016-05-09 VITALS — BP 144/98 | HR 64 | Temp 97.9°F | Resp 16 | Ht 71.0 in | Wt 227.2 lb

## 2016-05-09 DIAGNOSIS — R2232 Localized swelling, mass and lump, left upper limb: Secondary | ICD-10-CM

## 2016-05-09 DIAGNOSIS — L2089 Other atopic dermatitis: Secondary | ICD-10-CM

## 2016-05-09 MED ORDER — CRISABOROLE 2 % EX OINT: 1.0000 | TOPICAL_OINTMENT | Freq: Two times a day (BID) | CUTANEOUS | 11 refills | Status: DC

## 2016-05-09 NOTE — Progress Notes (Signed)
Pre visit review using our clinic review tool, if applicable. No additional management support is needed unless otherwise documented below in the visit note. 

## 2016-05-09 NOTE — Patient Instructions (Signed)
Eczema Eczema, also called atopic dermatitis, is a skin disorder that causes inflammation of the skin. It causes a red rash and dry, scaly skin. The skin becomes very itchy. Eczema is generally worse during the cooler winter months and often improves with the warmth of summer. Eczema usually starts showing signs in infancy. Some children outgrow eczema, but it may last through adulthood.  CAUSES  The exact cause of eczema is not known, but it appears to run in families. People with eczema often have a family history of eczema, allergies, asthma, or hay fever. Eczema is not contagious. Flare-ups of the condition may be caused by:   Contact with something you are sensitive or allergic to.   Stress. SIGNS AND SYMPTOMS  Dry, scaly skin.   Red, itchy rash.   Itchiness. This may occur before the skin rash and may be very intense.  DIAGNOSIS  The diagnosis of eczema is usually made based on symptoms and medical history. TREATMENT  Eczema cannot be cured, but symptoms usually can be controlled with treatment and other strategies. A treatment plan might include:  Controlling the itching and scratching.   Use over-the-counter antihistamines as directed for itching. This is especially useful at night when the itching tends to be worse.   Use over-the-counter steroid creams as directed for itching.   Avoid scratching. Scratching makes the rash and itching worse. It may also result in a skin infection (impetigo) due to a break in the skin caused by scratching.   Keeping the skin well moisturized with creams every day. This will seal in moisture and help prevent dryness. Lotions that contain alcohol and water should be avoided because they can dry the skin.   Limiting exposure to things that you are sensitive or allergic to (allergens).   Recognizing situations that cause stress.   Developing a plan to manage stress.  HOME CARE INSTRUCTIONS   Only take over-the-counter or  prescription medicines as directed by your health care provider.   Do not use anything on the skin without checking with your health care provider.   Keep baths or showers short (5 minutes) in warm (not hot) water. Use mild cleansers for bathing. These should be unscented. You may add nonperfumed bath oil to the bath water. It is best to avoid soap and bubble bath.   Immediately after a bath or shower, when the skin is still damp, apply a moisturizing ointment to the entire body. This ointment should be a petroleum ointment. This will seal in moisture and help prevent dryness. The thicker the ointment, the better. These should be unscented.   Keep fingernails cut short. Children with eczema may need to wear soft gloves or mittens at night after applying an ointment.   Dress in clothes made of cotton or cotton blends. Dress lightly, because heat increases itching.   A child with eczema should stay away from anyone with fever blisters or cold sores. The virus that causes fever blisters (herpes simplex) can cause a serious skin infection in children with eczema. SEEK MEDICAL CARE IF:   Your itching interferes with sleep.   Your rash gets worse or is not better within 1 week after starting treatment.   You see pus or soft yellow scabs in the rash area.   You have a fever.   You have a rash flare-up after contact with someone who has fever blisters.    This information is not intended to replace advice given to you by your health care   provider. Make sure you discuss any questions you have with your health care provider.   Document Released: 06/10/2000 Document Revised: 04/03/2013 Document Reviewed: 01/14/2013 Elsevier Interactive Patient Education 2016 Elsevier Inc.  

## 2016-05-09 NOTE — Progress Notes (Signed)
Subjective:  Patient ID: John Hogan, male    DOB: May 27, 1951  Age: 65 y.o. MRN: [id number]  CC: Rash   HPI John Hogan presents for concerns about a chronic itchy rash on his left lower back and a tumor on the dorsum of his left hand. He has had surgery on his left him before for some prior tumors but says this most recent lesion developed over the last few weeks and is somewhat uncomfortable.  Outpatient Medications Prior to Visit  Medication Sig Dispense Refill  . allopurinol (ZYLOPRIM) 300 MG tablet Take 1 tablet by mouth  daily 90 tablet 1  . amLODipine (NORVASC) 10 MG tablet Take 1 tablet by mouth  daily 90 tablet 1  . colchicine 0.6 MG tablet TAKE 1 TABLET BY MOUTH TWICE DAILY 60 tablet 3  . dorzolamide-timolol (COSOPT) 22.3-6.8 MG/ML ophthalmic solution Place 1 drop into both eyes 2 (two) times daily.     Marland Kitchen losartan (COZAAR) 50 MG tablet Take 1 tablet (50 mg total) by mouth daily. 90 tablet 1  . pravastatin (PRAVACHOL) 20 MG tablet Take 1 tablet (20 mg total) by mouth daily. Yearly physical is due w/labs must see md for refills 90 tablet 0  . Travoprost, BAK Free, (TRAVATAN) 0.004 % SOLN ophthalmic solution Place 1 drop into both eyes at bedtime.     Marland Kitchen zolpidem (AMBIEN) 5 MG tablet TAKE 1 TABLET BY MOUTH EVERY DAY AT BEDTIME AS NEEDED FOR SLEEP 30 tablet 1  . baclofen (LIORESAL) 10 MG tablet Take 1 tablet (10 mg total) by mouth 3 (three) times daily. 15 each 0  . triamcinolone cream (KENALOG) 0.1 % Apply 1 application topically 2 (two) times daily. 30 g 0   No facility-administered medications prior to visit.     ROS Review of Systems  Constitutional: Negative.   HENT: Negative.   Eyes: Negative.   Respiratory: Negative.  Negative for cough, choking, shortness of breath and stridor.   Cardiovascular: Negative for chest pain, palpitations and leg swelling.  Gastrointestinal: Negative.  Negative for abdominal pain, constipation, diarrhea, nausea and vomiting.    Endocrine: Negative.   Genitourinary: Negative.   Musculoskeletal: Negative.   Skin: Positive for rash. Negative for color change.  Allergic/Immunologic: Negative.   Neurological: Negative.   Hematological: Negative.   Psychiatric/Behavioral: Negative.     Objective:  BP (!) 144/98 (BP Location: Left Arm, Patient Position: Sitting, Cuff Size: Normal)   Pulse 64   Temp 97.9 F (36.6 C) (Oral)   Resp 16   Ht 5\' 11"  (1.803 m)   Wt 227 lb 4 oz (103.1 kg)   SpO2 96%   BMI 31.69 kg/m   BP Readings from Last 3 Encounters:  05/09/16 (!) 144/98  08/14/15 140/74  07/09/15 (!) 142/82    Wt Readings from Last 3 Encounters:  05/09/16 227 lb 4 oz (103.1 kg)  08/14/15 235 lb (106.6 kg)  07/09/15 235 lb 4.8 oz (106.7 kg)    Physical Exam  Constitutional: He is oriented to person, place, and time. No distress.  HENT:  Mouth/Throat: Oropharynx is clear and moist. No oropharyngeal exudate.  Eyes: Conjunctivae are normal. Right eye exhibits no discharge. Left eye exhibits no discharge. No scleral icterus.  Neck: Normal range of motion. Neck supple. No JVD present. No tracheal deviation present. No thyromegaly present.  Cardiovascular: Normal rate, regular rhythm, normal heart sounds and intact distal pulses.  Exam reveals no gallop and no friction rub.   No murmur  heard. Pulmonary/Chest: Effort normal and breath sounds normal. No stridor. No respiratory distress. He has no wheezes. He has no rales. He exhibits no tenderness.  Abdominal: Soft. Bowel sounds are normal. He exhibits no distension and no mass. There is no tenderness. There is no rebound and no guarding.  Musculoskeletal: Normal range of motion. He exhibits no edema, tenderness or deformity.       Hands: Lymphadenopathy:    He has no cervical adenopathy.  Neurological: He is oriented to person, place, and time.  Skin: Rash noted. Rash is papular. He is not diaphoretic. No erythema. No pallor.     Vitals reviewed.   Lab  Results  Component Value Date   WBC 4.6 07/06/2015   HGB 14.9 07/06/2015   HCT 45.0 07/06/2015   PLT 231.0 07/06/2015   GLUCOSE 97 07/06/2015   CHOL 199 08/06/2014   TRIG 73.0 08/06/2014   HDL 70.90 08/06/2014   LDLDIRECT 140.7 02/27/2013   LDLCALC 114 (H) 08/06/2014   ALT 35 08/06/2014   AST 22 08/06/2014   NA 143 07/06/2015   K 5.1 07/06/2015   CL 105 07/06/2015   CREATININE 1.50 07/06/2015   BUN 18 07/06/2015   CO2 32 07/06/2015   TSH 1.34 08/06/2014   PSA 0.81 08/06/2014    US Renal  Result Date: 08/14/2014 CLINICAL DATA:  Renal insufficiency, history of hypertension EXAM: RENAL/URINARY TRACT ULTRASOUND COMPLETE COMPARISON:  None. FINDINGS: Right Kidney: Length: 11.0 cm. Increased renal cortical echogenicity without hydronephrosis. Right mid renal cortical cyst, 1.6 x 1.6 x 1.4 cm Left Kidney: Length: 11.9 cm. Increased renal cortical echogenicity without hydronephrosis. Left mid/inferior simple appearing cyst, 8.5 x 4.4 x 3.8 cm. Bladder: Appears normal for degree of bladder distention. IMPRESSION: Increased renal cortical echogenicity compatible with medical renal disease. No hydronephrosis. Bilateral renal cortical cysts. Electronically Signed   By: Conchita Paris M.D.   On: 08/14/2014 19:35    Assessment & Plan:   [name] was seen today for rash.  Diagnoses and all orders for this visit:  Papular atopic dermatitis -     Crisaborole (EUCRISA) 2 % OINT; Apply 1 Act topically 2 (two) times daily.  Mass of left hand- this feels like a benign lesion but it's uncomfortable and is developed quickly, I've asked him to go back to hand surgery again to consider having it removed and sent for pathology -     Ambulatory referral to Orthopedic Surgery   I have discontinued [name]'s baclofen and triamcinolone cream. I am also having him start on Crisaborole. Additionally, I am having him maintain his Travoprost (BAK Free), dorzolamide-timolol, pravastatin, allopurinol,  amLODipine, losartan, colchicine, zolpidem, ALPHAGAN P, dorzolamide, and timolol.  Meds ordered this encounter  Medications  . ALPHAGAN P 0.1 % SOLN    Sig: PUT 1 DROP IN BOTH EYES TWICE A DAY    Refill:  4  . dorzolamide (TRUSOPT) 2 % ophthalmic solution    Sig: INSTILL 1 DROP INTO BOTH EYES TWICE A DAY    Refill:  4  . timolol (TIMOPTIC) 0.5 % ophthalmic solution    Sig: PLACE 1 DROP INTO BOTH EYES IN THE MORNING    Refill:  4  . Crisaborole (EUCRISA) 2 % OINT    Sig: Apply 1 Act topically 2 (two) times daily.    Dispense:  60 g    Refill:  11     Follow-up: No Follow-up on file.  Scarlette Calico, MD

## 2016-05-16 ENCOUNTER — Telehealth: Payer: Self-pay

## 2016-05-16 NOTE — Telephone Encounter (Signed)
PA initiated via CoverMyMeds key [id number]

## 2016-05-16 NOTE — Telephone Encounter (Signed)
PA APPROVED, pharmacy to be notified via CoverMyMeds

## 2016-05-26 ENCOUNTER — Encounter: Payer: 59 | Admitting: Internal Medicine

## 2016-05-27 ENCOUNTER — Ambulatory Visit (INDEPENDENT_AMBULATORY_CARE_PROVIDER_SITE_OTHER): Payer: 59 | Admitting: Orthopedic Surgery

## 2016-05-27 ENCOUNTER — Encounter (INDEPENDENT_AMBULATORY_CARE_PROVIDER_SITE_OTHER): Payer: Self-pay | Admitting: Orthopedic Surgery

## 2016-05-27 DIAGNOSIS — R2232 Localized swelling, mass and lump, left upper limb: Secondary | ICD-10-CM

## 2016-05-27 NOTE — Progress Notes (Signed)
Office Visit Note   Patient: John Hogan           Date of Birth: 04/01/1951           MRN: [id number] Visit Date: 05/27/2016 Requested by: Janith Lima, MD [address] [place], [place] [address] PCP: Scarlette Calico, MD  Subjective: Chief Complaint  Patient presents with  . Left Hand - Pain, Mass    HPI John Hogan is a 65 year old right-hand dominant patient with left hand cyst.  He is concerned because it is getting bigger over the past 3 weeks.  He is not having a lot of pain.  He did have benign cyst removed 3 years ago from the same location.  Pathology was ganglion cyst.  It does affect him with his daily work and manual dexterity but he denies any pain numbness and tingling or weakness in that left hand             Review of Systems All systems reviewed are negative as they relate to the chief complaint within the history of present illness.  Patient denies  fevers or chills.    Assessment & Plan: Visit Diagnoses:  1. Mass of left hand     Plan: Impression is recurrent left hand ganglion cyst.  I looked of this with ultrasound and the stalk does appear to be going to the index MCP joint.  Discussed with him today about observation aspiration or removal.  Because of the size and prominence John Hogan would like to have this reexcised.  Risk and benefits again discussed.  Chance of recurrence also discussed.  We will plan for surgical excision  within the next several weeks.  All questions answered  Follow-Up Instructions: No Follow-up on file.   Orders:  No orders of the defined types were placed in this encounter.  No orders of the defined types were placed in this encounter.     Procedures: No procedures performed   Clinical Data: No additional findings.  Objective: Vital Signs: There were no vitals taken for this visit.  Physical Exam  Constitutional: He appears well-developed.  HENT:  Head: Normocephalic.  Eyes: EOM are normal.  Neck: Normal range  of motion.  Cardiovascular: Normal rate.   Pulmonary/Chest: Effort normal.  Neurological: He is alert.  Skin: Skin is warm.  Psychiatric: He has a normal mood and affect.    Ortho Exam examination of the left hand demonstrates full composite flexion extension of the fingers.  EPL FPL interosseous strength is intact.  No paresthesias on the dorsal or palmar aspect of the index or middle finger fingers are perfused.  Radial pulses intact.  Specialty Comments:  No specialty comments available.  Imaging: No results found.   PMFS History: Patient Active Problem List   Diagnosis Date Noted  . Mass of left hand 05/09/2016  . Right-sided low back pain without sciatica 07/06/2015  . CKD (chronic kidney disease) stage 3, GFR 30-59 ml/min 08/07/2014  . Primary osteoarthritis of left shoulder 06/09/2014  . Insomnia 02/14/2014  . Need for prophylactic vaccination and inoculation against influenza 02/27/2013  . Erectile dysfunction 02/27/2013  . Papular atopic dermatitis 05/16/2012  . Gout 02/16/2012  . Routine general medical examination at a health care facility 02/16/2012  . Hyperlipidemia with target LDL less than 100 02/16/2012  . Aortic regurgitation 02/16/2012  . Colon polyps 02/16/2012  . Essential hypertension, benign 03/07/2009  . Hypertensive heart disease without heart failure 03/07/2009  . COMBINED HEART FAILURE,  CHRONIC 03/07/2009   Past Medical History:  Diagnosis Date  . AI (aortic insufficiency)   . Gout   . H/O exercise stress test    Myoview 01/03/12: No scar or ischemia, EF 53%  . Hyperlipidemia   . Hypertension   . Hypertensive cardiomyopathy ([place])    EF previously 35%;  echo 12/2012 EF 55%, mild AI, ascending aorta mildly dilated, mild LAE    Family History  Problem Relation Age of Onset  . Hypertension Father   . Colon cancer Father 103  . Hyperlipidemia Father   . Heart disease Father   . Diabetes Father   . Stroke Mother     deceased  . Colon cancer  Mother 49  . Arthritis Mother   . Hyperlipidemia Mother   . Hypertension Mother   . Diabetes Mother   . COPD Neg Hx   . Alcohol abuse Neg Hx   . Kidney disease Neg Hx   . Esophageal cancer Neg Hx   . Rectal cancer Neg Hx   . Stomach cancer Neg Hx     Past Surgical History:  Procedure Laterality Date  . COLONOSCOPY    . HERNIA REPAIR     Umbilical  . WRIST SURGERY     right   Social History   Occupational History  .  [employer]   Social History Main Topics  . Smoking status: Former Smoker    Packs/day: 1.00    Years: 2.00    Types: Cigarettes    Quit date: 01/10/1975  . Smokeless tobacco: Never Used     Comment: Only smoked two years  . Alcohol use No  . Drug use: No  . Sexual activity: Yes

## 2016-06-13 ENCOUNTER — Encounter: Payer: 59 | Admitting: Internal Medicine

## 2016-06-13 ENCOUNTER — Encounter: Payer: Self-pay | Admitting: Orthopedic Surgery

## 2016-06-13 ENCOUNTER — Other Ambulatory Visit: Payer: Self-pay

## 2016-06-13 DIAGNOSIS — M65861 Other synovitis and tenosynovitis, right lower leg: Secondary | ICD-10-CM | POA: Diagnosis not present

## 2016-06-15 ENCOUNTER — Telehealth (INDEPENDENT_AMBULATORY_CARE_PROVIDER_SITE_OTHER): Payer: Self-pay | Admitting: *Deleted

## 2016-06-15 NOTE — Telephone Encounter (Signed)
Pt daughter called stating she wasn't sure if they could remove the wrapping so pt can shower.

## 2016-06-15 NOTE — Telephone Encounter (Signed)
IC daughter and advised yes, dressing can be removed, pt has waterproof dressing, advised to leave that on, and ok to shower.

## 2016-06-23 ENCOUNTER — Inpatient Hospital Stay (INDEPENDENT_AMBULATORY_CARE_PROVIDER_SITE_OTHER): Payer: 59 | Admitting: Orthopaedic Surgery

## 2016-06-24 ENCOUNTER — Encounter (INDEPENDENT_AMBULATORY_CARE_PROVIDER_SITE_OTHER): Payer: Self-pay | Admitting: Orthopaedic Surgery

## 2016-06-24 ENCOUNTER — Ambulatory Visit (INDEPENDENT_AMBULATORY_CARE_PROVIDER_SITE_OTHER): Payer: 59 | Admitting: Orthopaedic Surgery

## 2016-06-24 DIAGNOSIS — R2232 Localized swelling, mass and lump, left upper limb: Secondary | ICD-10-CM

## 2016-06-24 NOTE — Progress Notes (Signed)
The patient is 11 days status post left hand ganglion cyst removal. He is doing well. He is not taking any pain medicines. His exam shows a well-healed incision without any signs of infection. The sutures were removed. Patient is instructed to avoid submerging the incision for another couple weeks. He may gently start to wash his hand. Increase activity as tolerated. We'll see him back as needed.

## 2016-06-28 ENCOUNTER — Encounter: Payer: 59 | Admitting: Internal Medicine

## 2016-07-07 ENCOUNTER — Encounter: Payer: Self-pay | Admitting: Internal Medicine

## 2016-07-07 ENCOUNTER — Ambulatory Visit (INDEPENDENT_AMBULATORY_CARE_PROVIDER_SITE_OTHER): Payer: 59 | Admitting: Internal Medicine

## 2016-07-07 ENCOUNTER — Other Ambulatory Visit (INDEPENDENT_AMBULATORY_CARE_PROVIDER_SITE_OTHER): Payer: 59

## 2016-07-07 VITALS — BP 144/80 | HR 59 | Temp 98.1°F | Resp 16 | Ht 71.0 in | Wt 228.0 lb

## 2016-07-07 DIAGNOSIS — I5042 Chronic combined systolic (congestive) and diastolic (congestive) heart failure: Secondary | ICD-10-CM | POA: Diagnosis not present

## 2016-07-07 DIAGNOSIS — I351 Nonrheumatic aortic (valve) insufficiency: Secondary | ICD-10-CM

## 2016-07-07 DIAGNOSIS — I10 Essential (primary) hypertension: Secondary | ICD-10-CM

## 2016-07-07 DIAGNOSIS — M1000 Idiopathic gout, unspecified site: Secondary | ICD-10-CM | POA: Diagnosis not present

## 2016-07-07 DIAGNOSIS — E785 Hyperlipidemia, unspecified: Secondary | ICD-10-CM

## 2016-07-07 DIAGNOSIS — Z0000 Encounter for general adult medical examination without abnormal findings: Secondary | ICD-10-CM

## 2016-07-07 LAB — COMPREHENSIVE METABOLIC PANEL
ALK PHOS: 81 U/L (ref 39–117)
ALT: 17 U/L (ref 0–53)
AST: 18 U/L (ref 0–37)
Albumin: 4.4 g/dL (ref 3.5–5.2)
BILIRUBIN TOTAL: 0.9 mg/dL (ref 0.2–1.2)
BUN: 22 mg/dL (ref 6–23)
CALCIUM: 9.5 mg/dL (ref 8.4–10.5)
CO2: 31 mEq/L (ref 19–32)
Chloride: 102 mEq/L (ref 96–112)
Creatinine, Ser: 1.31 mg/dL (ref 0.40–1.50)
GFR: 70.25 mL/min (ref >60.00)
GLUCOSE: 109 mg/dL — AB (ref 70–99)
Potassium: 4 mEq/L (ref 3.5–5.1)
Sodium: 140 mEq/L (ref 135–145)
TOTAL PROTEIN: 7.6 g/dL (ref 6.0–8.3)

## 2016-07-07 LAB — CBC WITH DIFFERENTIAL/PLATELET
BASOS ABS: 0.1 10*3/uL (ref 0.0–0.1)
Basophils Relative: 1.2 % (ref 0.0–3.0)
EOS ABS: 0.4 10*3/uL (ref 0.0–0.7)
Eosinophils Relative: 9.8 % — ABNORMAL HIGH (ref 0.0–5.0)
HCT: 41.7 % (ref 39.0–52.0)
Hemoglobin: 14.4 g/dL (ref 13.0–17.0)
LYMPHS ABS: 1.3 10*3/uL (ref 0.7–4.0)
LYMPHS PCT: 31.3 % (ref 12.0–46.0)
MCHC: 34.5 g/dL (ref 30.0–36.0)
MCV: 91.1 fl (ref 78.0–100.0)
MONOS PCT: 7.4 % (ref 3.0–12.0)
Monocytes Absolute: 0.3 10*3/uL (ref 0.1–1.0)
NEUTROS PCT: 50.3 % (ref 43.0–77.0)
Neutro Abs: 2.1 10*3/uL (ref 1.4–7.7)
PLATELETS: 189 10*3/uL (ref 150.0–400.0)
RBC: 4.58 Mil/uL (ref 4.22–5.81)
RDW: 13.9 % (ref 11.5–15.5)
WBC: 4.2 10*3/uL (ref 4.0–10.5)

## 2016-07-07 LAB — LIPID PANEL
Cholesterol: 218 mg/dL — ABNORMAL HIGH (ref 0–200)
HDL: 63.6 mg/dL (ref >39.00)
LDL Cholesterol: 139 mg/dL — ABNORMAL HIGH (ref 0–99)
NonHDL: 154.66
TRIGLYCERIDES: 79 mg/dL (ref 0.0–149.0)
Total CHOL/HDL Ratio: 3
VLDL: 15.8 mg/dL (ref 0.0–40.0)

## 2016-07-07 LAB — TSH: TSH: 1.35 u[IU]/mL (ref 0.35–4.50)

## 2016-07-07 LAB — PSA: PSA: 1.45 ng/mL (ref 0.10–4.00)

## 2016-07-07 MED ORDER — LOSARTAN POTASSIUM 50 MG PO TABS: 50.0000 mg | ORAL_TABLET | Freq: Every day | ORAL | 1 refills | Status: DC

## 2016-07-07 MED ORDER — ALLOPURINOL 300 MG PO TABS: 300.0000 mg | ORAL_TABLET | Freq: Every day | ORAL | 1 refills | Status: DC

## 2016-07-07 MED ORDER — PRAVASTATIN SODIUM 20 MG PO TABS: 20.0000 mg | ORAL_TABLET | Freq: Every day | ORAL | 3 refills | Status: DC

## 2016-07-07 MED ORDER — AMLODIPINE BESYLATE 10 MG PO TABS: 10.0000 mg | ORAL_TABLET | Freq: Every day | ORAL | 1 refills | Status: DC

## 2016-07-07 NOTE — Progress Notes (Signed)
Subjective:  Patient ID: John Hogan, male    DOB: 02/12/1951  Age: 66 y.o. MRN: [id number]  CC: Hypertension and Annual Exam   HPI John Hogan presents for a CPX.  He tells me his blood pressures been well controlled and he denies any recent episodes of headache, chest pain, blurred vision, shortness of breath, palpitations, edema, or fatigue.  He ran out of his cholesterol medicine a few days ago but he tells me when he did take it did not cause any muscle or joint aches.  Outpatient Medications Prior to Visit  Medication Sig Dispense Refill  . ALPHAGAN P 0.1 % SOLN PUT 1 DROP IN BOTH EYES TWICE A DAY  4  . colchicine 0.6 MG tablet TAKE 1 TABLET BY MOUTH TWICE DAILY 60 tablet 3  . Crisaborole (EUCRISA) 2 % OINT Apply 1 Act topically 2 (two) times daily. 60 g 11  . dorzolamide (TRUSOPT) 2 % ophthalmic solution INSTILL 1 DROP INTO BOTH EYES TWICE A DAY  4  . dorzolamide-timolol (COSOPT) 22.3-6.8 MG/ML ophthalmic solution Place 1 drop into both eyes 2 (two) times daily.     . timolol (TIMOPTIC) 0.5 % ophthalmic solution PLACE 1 DROP INTO BOTH EYES IN THE MORNING  4  . Travoprost, BAK Free, (TRAVATAN) 0.004 % SOLN ophthalmic solution Place 1 drop into both eyes at bedtime.     Marland Kitchen allopurinol (ZYLOPRIM) 300 MG tablet Take 1 tablet by mouth  daily 90 tablet 1  . amLODipine (NORVASC) 10 MG tablet Take 1 tablet by mouth  daily 90 tablet 1  . losartan (COZAAR) 50 MG tablet Take 1 tablet (50 mg total) by mouth daily. 90 tablet 1  . pravastatin (PRAVACHOL) 20 MG tablet Take 1 tablet (20 mg total) by mouth daily. Yearly physical is due w/labs must see md for refills 90 tablet 0  . zolpidem (AMBIEN) 5 MG tablet TAKE 1 TABLET BY MOUTH EVERY DAY AT BEDTIME AS NEEDED FOR SLEEP 30 tablet 1   No facility-administered medications prior to visit.     ROS Review of Systems  Constitutional: Negative.  Negative for appetite change, diaphoresis, fatigue and unexpected weight change.  HENT:  Negative.   Eyes: Negative.   Respiratory: Negative.  Negative for cough, chest tightness, shortness of breath and wheezing.   Cardiovascular: Negative for chest pain, palpitations and leg swelling.  Gastrointestinal: Negative for abdominal pain, constipation, diarrhea and nausea.  Endocrine: Negative.   Genitourinary: Negative.  Negative for difficulty urinating, discharge, dysuria, frequency, penile swelling, scrotal swelling, testicular pain and urgency.  Musculoskeletal: Negative.  Negative for back pain and myalgias.  Skin: Negative.   Allergic/Immunologic: Negative.   Neurological: Negative.  Negative for dizziness, weakness and headaches.  Hematological: Negative.  Negative for adenopathy. Does not bruise/bleed easily.  Psychiatric/Behavioral: Negative.     Objective:  BP (!) 144/80 (BP Location: Left Arm, Patient Position: Sitting, Cuff Size: Large)   Pulse (!) 59   Temp 98.1 F (36.7 C) (Oral)   Resp 16   Ht 5\' 11"  (1.803 m)   Wt 228 lb (103.4 kg)   SpO2 96%   BMI 31.80 kg/m   BP Readings from Last 3 Encounters:  07/11/16 138/82  07/07/16 (!) 144/80  05/09/16 (!) 144/98    Wt Readings from Last 3 Encounters:  07/11/16 226 lb (102.5 kg)  07/07/16 228 lb (103.4 kg)  05/09/16 227 lb 4 oz (103.1 kg)    Physical Exam  Constitutional: No distress.  HENT:  Mouth/Throat: Oropharynx is clear and moist. No oropharyngeal exudate.  Eyes: Conjunctivae are normal. Right eye exhibits no discharge. Left eye exhibits no discharge. No scleral icterus.  Neck: Normal range of motion. Neck supple. No JVD present. No tracheal deviation present. No thyromegaly present.  Cardiovascular: Normal rate, regular rhythm, normal heart sounds and intact distal pulses.  Exam reveals no gallop and no friction rub.   No murmur heard. Pulmonary/Chest: Effort normal and breath sounds normal. No stridor. No respiratory distress. He has no wheezes. He has no rales. He exhibits no tenderness.    Abdominal: Soft. Bowel sounds are normal. He exhibits no distension and no mass. There is no tenderness. There is no rebound and no guarding. Hernia confirmed negative in the right inguinal area and confirmed negative in the left inguinal area.  Genitourinary: Rectum normal, prostate normal, testes normal and penis normal. Rectal exam shows no external hemorrhoid, no internal hemorrhoid, no fissure, no mass, no tenderness, anal tone normal and guaiac negative stool. Prostate is not enlarged and not tender. Right testis shows no mass and no swelling. Right testis is descended. Left testis shows no mass, no swelling and no tenderness. Left testis is descended. Uncircumcised. No phimosis, paraphimosis, hypospadias, penile erythema or penile tenderness. No discharge found.  Musculoskeletal: Normal range of motion. He exhibits edema (trace pitting edema in BLE). He exhibits no tenderness or deformity.  Lymphadenopathy:    He has no cervical adenopathy.       Right: No inguinal adenopathy present.       Left: No inguinal adenopathy present.  Skin: Skin is warm and dry. No rash noted. He is not diaphoretic. No erythema. No pallor.  Psychiatric: He has a normal mood and affect. His behavior is normal. Judgment and thought content normal.  Vitals reviewed.   Lab Results  Component Value Date   WBC 4.2 07/07/2016   HGB 14.4 07/07/2016   HCT 41.7 07/07/2016   PLT 189.0 07/07/2016   GLUCOSE 109 (H) 07/07/2016   CHOL 218 (H) 07/07/2016   TRIG 79.0 07/07/2016   HDL 63.60 07/07/2016   LDLDIRECT 140.7 02/27/2013   LDLCALC 139 (H) 07/07/2016   ALT 17 07/07/2016   AST 18 07/07/2016   NA 140 07/07/2016   K 4.0 07/07/2016   CL 102 07/07/2016   CREATININE 1.31 07/07/2016   BUN 22 07/07/2016   CO2 31 07/07/2016   TSH 1.35 07/07/2016   PSA 1.45 07/07/2016    US Renal  Result Date: 08/14/2014 CLINICAL DATA:  Renal insufficiency, history of hypertension EXAM: RENAL/URINARY TRACT ULTRASOUND COMPLETE  COMPARISON:  None. FINDINGS: Right Kidney: Length: 11.0 cm. Increased renal cortical echogenicity without hydronephrosis. Right mid renal cortical cyst, 1.6 x 1.6 x 1.4 cm Left Kidney: Length: 11.9 cm. Increased renal cortical echogenicity without hydronephrosis. Left mid/inferior simple appearing cyst, 8.5 x 4.4 x 3.8 cm. Bladder: Appears normal for degree of bladder distention. IMPRESSION: Increased renal cortical echogenicity compatible with medical renal disease. No hydronephrosis. Bilateral renal cortical cysts. Electronically Signed   By: Conchita Paris M.D.   On: 08/14/2014 19:35    Assessment & Plan:   Haydyn was seen today for hypertension and annual exam.  Diagnoses and all orders for this visit:  Idiopathic gout, unspecified chronicity, unspecified site- his had no recent episodes of gout arthropathy, will continue allopurinol to current dose. -     allopurinol (ZYLOPRIM) 300 MG tablet; Take 1 tablet (300 mg total) by mouth daily.  Routine general medical examination at  a health care facility- exam completed, labs ordered and reviewed, vaccines reviewed and updated, patient education material was given. -     Lipid panel; Future -     Comprehensive metabolic panel; Future -     CBC with Differential/Platelet; Future -     PSA; Future -     TSH; Future  Nonrheumatic aortic valve insufficiency- he has no symptoms related to this, he has an upcoming appointment with cardiology for monitoring  Essential hypertension, benign- his blood pressure is well-controlled, electrolytes and renal function are normal, I have encouraged him to improve his lifestyle modifications. -     losartan (COZAAR) 50 MG tablet; Take 1 tablet (50 mg total) by mouth daily. -     amLODipine (NORVASC) 10 MG tablet; Take 1 tablet (10 mg total) by mouth daily.  COMBINED HEART FAILURE, CHRONIC- he has a normal volume status, will continue losartan and amlodipine, he has an appointment soon with cardiology -      losartan (COZAAR) 50 MG tablet; Take 1 tablet (50 mg total) by mouth daily.  Hyperlipidemia with target LDL less than 100- he has not achieved his LDL goal, will restart statin therapy -     Discontinue: pravastatin (PRAVACHOL) 20 MG tablet; Take 1 tablet (20 mg total) by mouth daily.   I have discontinued Mr. Quinonez's pravastatin. I have also changed his amLODipine and allopurinol. Additionally, I am having him maintain his Travoprost (BAK Free), dorzolamide-timolol, colchicine, ALPHAGAN P, dorzolamide, timolol, Crisaborole, and losartan.  Meds ordered this encounter  Medications  . DISCONTD: pravastatin (PRAVACHOL) 20 MG tablet    Sig: Take 1 tablet (20 mg total) by mouth daily.    Dispense:  90 tablet    Refill:  3  . losartan (COZAAR) 50 MG tablet    Sig: Take 1 tablet (50 mg total) by mouth daily.    Dispense:  90 tablet    Refill:  1  . amLODipine (NORVASC) 10 MG tablet    Sig: Take 1 tablet (10 mg total) by mouth daily.    Dispense:  90 tablet    Refill:  1  . allopurinol (ZYLOPRIM) 300 MG tablet    Sig: Take 1 tablet (300 mg total) by mouth daily.    Dispense:  90 tablet    Refill:  1     Follow-up: Return in about 6 months (around 01/04/2017).  Scarlette Calico, MD

## 2016-07-07 NOTE — Patient Instructions (Signed)

## 2016-07-07 NOTE — Progress Notes (Signed)
Pre visit review using our clinic review tool, if applicable. No additional management support is needed unless otherwise documented below in the visit note. 

## 2016-07-09 NOTE — Progress Notes (Signed)
HPI The patient presents for followup of aortic insufficiency. He has had previously reduced ejection fraction but it has been improved a followup. The last echo was 2015.  Aortic insufficiency has been moderate but was mild more recently in 2015.  He presents for followup.   The patient denies any new symptoms such as chest discomfort, neck or arm discomfort. There has been no new shortness of breath, PND or orthopnea. There have been no reported palpitations, presyncope or syncope.  He is still working and active.     No Known Allergies  Current Outpatient Prescriptions  Medication Sig Dispense Refill  . allopurinol (ZYLOPRIM) 300 MG tablet Take 1 tablet (300 mg total) by mouth daily. 90 tablet 1  . ALPHAGAN P 0.1 % SOLN PUT 1 DROP IN BOTH EYES TWICE A DAY  4  . amLODipine (NORVASC) 10 MG tablet Take 1 tablet (10 mg total) by mouth daily. 90 tablet 1  . colchicine 0.6 MG tablet TAKE 1 TABLET BY MOUTH TWICE DAILY 60 tablet 3  . Crisaborole (EUCRISA) 2 % OINT Apply 1 Act topically 2 (two) times daily. 60 g 11  . dorzolamide (TRUSOPT) 2 % ophthalmic solution INSTILL 1 DROP INTO BOTH EYES TWICE A DAY  4  . dorzolamide-timolol (COSOPT) 22.3-6.8 MG/ML ophthalmic solution Place 1 drop into both eyes 2 (two) times daily.     Marland Kitchen losartan (COZAAR) 50 MG tablet Take 1 tablet (50 mg total) by mouth daily. 90 tablet 1  . pravastatin (PRAVACHOL) 20 MG tablet Take 1 tablet (20 mg total) by mouth daily. 90 tablet 3  . timolol (TIMOPTIC) 0.5 % ophthalmic solution PLACE 1 DROP INTO BOTH EYES IN THE MORNING  4  . Travoprost, BAK Free, (TRAVATAN) 0.004 % SOLN ophthalmic solution Place 1 drop into both eyes at bedtime.     Marland Kitchen zolpidem (AMBIEN) 5 MG tablet TAKE 1 TABLET BY MOUTH EVERY DAY AT BEDTIME AS NEEDED FOR SLEEP 30 tablet 1   No current facility-administered medications for this visit.     Past Medical History:  Diagnosis Date  . AI (aortic insufficiency)   . Gout   . H/O exercise stress test    Myoview 01/03/12: No scar or ischemia, EF 53%  . Hyperlipidemia   . Hypertension   . Hypertensive cardiomyopathy ([place])    EF previously 35%;  echo 12/2012 EF 55%, mild AI, ascending aorta mildly dilated, mild LAE    Past Surgical History:  Procedure Laterality Date  . COLONOSCOPY    . HERNIA REPAIR     Umbilical  . WRIST SURGERY     right    ROS:  As stated in the HPI and negative for all other systems.  PHYSICAL EXAM BP 138/82   Pulse (!) 54   Ht 5\' 11"  (1.803 m)   Wt 226 lb (102.5 kg)   BMI 31.52 kg/m  GENERAL:  Well appearing HEENT:  Pupils equal round and reactive, fundi not visualized, oral mucosa unremarkable, dentures NECK:  No jugular venous distention, waveform within normal limits, carotid upstroke brisk and symmetric, no bruits, no thyromegaly LUNGS:  Clear to auscultation bilaterally CHEST:  Unremarkable HEART:  PMI not displaced or sustained,S1 and S2 within normal limits, no S3, no S4, no clicks, no rubs, 2/6 diastolic murmur heard best at the left sternal border midsternal during full expiration, no diastolic murmurs ABD:  Flat, positive bowel sounds normal in frequency in pitch, no bruits, no rebound, no guarding, no midline pulsatile mass, no  hepatomegaly, no splenomegaly EXT:  2 plus pulses throughout, no edema, no cyanosis no clubbing  EKG:    Sinus bradycardia, rate 54, left axis , intervals within normal limits, no acute ST T wave changes. 07/11/2016  Lab Results  Component Value Date   CHOL 218 (H) 07/07/2016   TRIG 79.0 07/07/2016   HDL 63.60 07/07/2016   LDLCALC 139 (H) 07/07/2016   LDLDIRECT 140.7 02/27/2013    ASSESSMENT AND PLAN  Aortic insufficiency - This has been mild on echo in 2015.  I would like to repeat the echo.   HTN - The blood pressure is at target. No change in medications is indicated. We will continue with therapeutic lifestyle changes (TLC).  Cardiomyopathy - He had a low EF in the past.  I will follow up as above.    HTN:- His BP is controlled.  He will continue the meds as listed.  Dyslipidemia -  I will increase the Pravachol to 80 mg qhs.   He will need a lipid test in 8 weeks.

## 2016-07-11 ENCOUNTER — Ambulatory Visit (INDEPENDENT_AMBULATORY_CARE_PROVIDER_SITE_OTHER): Payer: 59 | Admitting: Cardiology

## 2016-07-11 ENCOUNTER — Encounter: Payer: Self-pay | Admitting: Cardiology

## 2016-07-11 ENCOUNTER — Other Ambulatory Visit: Payer: Self-pay | Admitting: Cardiology

## 2016-07-11 VITALS — BP 138/82 | HR 54 | Ht 71.0 in | Wt 226.0 lb

## 2016-07-11 DIAGNOSIS — E785 Hyperlipidemia, unspecified: Secondary | ICD-10-CM | POA: Diagnosis not present

## 2016-07-11 DIAGNOSIS — I351 Nonrheumatic aortic (valve) insufficiency: Secondary | ICD-10-CM | POA: Diagnosis not present

## 2016-07-11 MED ORDER — PRAVASTATIN SODIUM 80 MG PO TABS: 80.0000 mg | ORAL_TABLET | Freq: Every day | ORAL | 3 refills | Status: DC

## 2016-07-11 NOTE — Patient Instructions (Addendum)
Medication Instructions:  INCREASE- Pravastatin 80 mg daily  Labwork: Fasting Lipids in 8 weeks  Testing/Procedures: Your physician has requested that you have an echocardiogram. Echocardiography is a painless test that uses sound waves to create images of your heart. It provides your doctor with information about the size and shape of your heart and how well your heart's chambers and valves are working. This procedure takes approximately one hour. There are no restrictions for this procedure.  Follow-Up: Your physician wants you to follow-up in: 1 Year. You will receive a reminder letter in the mail two months in advance. If you don't receive a letter, please call our office to schedule the follow-up appointment.   Any Other Special Instructions Will Be Listed Below (If Applicable).     If you need a refill on your cardiac medications before your next appointment, please call your pharmacy.

## 2016-07-11 NOTE — Telephone Encounter (Signed)
Pravastatin refilled 07/11/16

## 2016-07-12 ENCOUNTER — Other Ambulatory Visit: Payer: Self-pay | Admitting: Internal Medicine

## 2016-07-14 ENCOUNTER — Other Ambulatory Visit: Payer: Self-pay | Admitting: Internal Medicine

## 2016-07-14 NOTE — Telephone Encounter (Signed)
Faxed to [facility].

## 2016-07-28 ENCOUNTER — Other Ambulatory Visit: Payer: Self-pay

## 2016-07-28 ENCOUNTER — Ambulatory Visit (HOSPITAL_COMMUNITY): Payer: 59 | Attending: Cardiovascular Disease

## 2016-07-28 DIAGNOSIS — E785 Hyperlipidemia, unspecified: Secondary | ICD-10-CM | POA: Diagnosis not present

## 2016-07-28 DIAGNOSIS — I119 Hypertensive heart disease without heart failure: Secondary | ICD-10-CM | POA: Insufficient documentation

## 2016-07-28 DIAGNOSIS — I43 Cardiomyopathy in diseases classified elsewhere: Secondary | ICD-10-CM | POA: Diagnosis not present

## 2016-07-28 DIAGNOSIS — I351 Nonrheumatic aortic (valve) insufficiency: Secondary | ICD-10-CM | POA: Insufficient documentation

## 2016-09-19 LAB — LIPID PANEL
CHOLESTEROL: 161 mg/dL (ref <200)
HDL: 63 mg/dL (ref >40)
LDL CALC: 86 mg/dL (ref <100)
TRIGLYCERIDES: 60 mg/dL (ref <150)
Total CHOL/HDL Ratio: 2.6 Ratio (ref <5.0)
VLDL: 12 mg/dL (ref <30)

## 2016-11-13 ENCOUNTER — Other Ambulatory Visit: Payer: Self-pay | Admitting: Internal Medicine

## 2017-01-10 ENCOUNTER — Other Ambulatory Visit: Payer: Self-pay | Admitting: Family

## 2017-01-10 DIAGNOSIS — R21 Rash and other nonspecific skin eruption: Secondary | ICD-10-CM

## 2017-01-24 ENCOUNTER — Telehealth: Payer: Self-pay

## 2017-01-24 DIAGNOSIS — I5042 Chronic combined systolic (congestive) and diastolic (congestive) heart failure: Secondary | ICD-10-CM

## 2017-01-24 DIAGNOSIS — M1000 Idiopathic gout, unspecified site: Secondary | ICD-10-CM

## 2017-01-24 DIAGNOSIS — I10 Essential (primary) hypertension: Secondary | ICD-10-CM

## 2017-01-24 NOTE — Telephone Encounter (Signed)
Left patient vm to call back to schedule 6 month fu.

## 2017-01-24 NOTE — Telephone Encounter (Addendum)
[facility] faxed request for refills. Not able to send.  Pt needs an appt. Can you schedule him?   Rx rq: pravastatin, zolpidem and triamcinolone cream, amlodipine, allopurinol and losartan.

## 2017-02-10 MED ORDER — LOSARTAN POTASSIUM 50 MG PO TABS: 50.0000 mg | ORAL_TABLET | Freq: Every day | ORAL | 0 refills | Status: DC

## 2017-02-10 MED ORDER — AMLODIPINE BESYLATE 10 MG PO TABS: 10.0000 mg | ORAL_TABLET | Freq: Every day | ORAL | 0 refills | Status: DC

## 2017-02-10 MED ORDER — ALLOPURINOL 300 MG PO TABS: 300.0000 mg | ORAL_TABLET | Freq: Every day | ORAL | 0 refills | Status: DC

## 2017-02-10 NOTE — Telephone Encounter (Signed)
Pt called and schedule appt for 38mo fu 9/13 w/[facility]  Needs a refill of amlodipine, allopurinol and losartan Please advise

## 2017-02-10 NOTE — Addendum Note (Signed)
Addended by: Earnstine Regal on: 02/10/2017 10:56 AM   Modules accepted: Orders

## 2017-02-10 NOTE — Telephone Encounter (Signed)
Called pt inform can send 30 day script to local pharmacy until appt. Sent rx's electronically to [facility].../lmb

## 2017-03-09 ENCOUNTER — Other Ambulatory Visit: Payer: Self-pay | Admitting: Internal Medicine

## 2017-03-09 ENCOUNTER — Ambulatory Visit (INDEPENDENT_AMBULATORY_CARE_PROVIDER_SITE_OTHER): Payer: 59 | Admitting: Internal Medicine

## 2017-03-09 ENCOUNTER — Encounter: Payer: Self-pay | Admitting: Internal Medicine

## 2017-03-09 ENCOUNTER — Ambulatory Visit: Payer: 59 | Admitting: Internal Medicine

## 2017-03-09 ENCOUNTER — Other Ambulatory Visit (INDEPENDENT_AMBULATORY_CARE_PROVIDER_SITE_OTHER): Payer: 59

## 2017-03-09 VITALS — BP 140/70 | HR 45 | Temp 98.0°F | Resp 16 | Ht 71.0 in | Wt 232.0 lb

## 2017-03-09 DIAGNOSIS — I10 Essential (primary) hypertension: Secondary | ICD-10-CM

## 2017-03-09 DIAGNOSIS — Z23 Encounter for immunization: Secondary | ICD-10-CM

## 2017-03-09 DIAGNOSIS — M1000 Idiopathic gout, unspecified site: Secondary | ICD-10-CM

## 2017-03-09 DIAGNOSIS — N183 Chronic kidney disease, stage 3 unspecified: Secondary | ICD-10-CM

## 2017-03-09 DIAGNOSIS — I5042 Chronic combined systolic (congestive) and diastolic (congestive) heart failure: Secondary | ICD-10-CM

## 2017-03-09 LAB — BASIC METABOLIC PANEL
BUN: 20 mg/dL (ref 6–23)
CALCIUM: 9.4 mg/dL (ref 8.4–10.5)
CO2: 29 mEq/L (ref 19–32)
CREATININE: 1.93 mg/dL — AB (ref 0.40–1.50)
Chloride: 104 mEq/L (ref 96–112)
GFR: 44.83 mL/min — ABNORMAL LOW (ref >60.00)
GLUCOSE: 108 mg/dL — AB (ref 70–99)
POTASSIUM: 4.2 meq/L (ref 3.5–5.1)
Sodium: 139 mEq/L (ref 135–145)

## 2017-03-09 MED ORDER — AMLODIPINE BESYLATE 10 MG PO TABS: 10.0000 mg | ORAL_TABLET | Freq: Every day | ORAL | 1 refills | Status: DC

## 2017-03-09 MED ORDER — ALLOPURINOL 300 MG PO TABS: 300.0000 mg | ORAL_TABLET | Freq: Every day | ORAL | 1 refills | Status: DC

## 2017-03-09 MED ORDER — LOSARTAN POTASSIUM 50 MG PO TABS: 50.0000 mg | ORAL_TABLET | Freq: Every day | ORAL | 1 refills | Status: DC

## 2017-03-09 NOTE — Patient Instructions (Signed)
Heart Failure °Heart failure means your heart has trouble pumping blood. This makes it hard for your body to work well. Heart failure is usually a long-term (chronic) condition. You must take good care of yourself and follow your doctor's treatment plan. °Follow these instructions at home: °· Take your heart medicine as told by your doctor. °? Do not stop taking medicine unless your doctor tells you to. °? Do not skip any dose of medicine. °? Refill your medicines before they run out. °? Take other medicines only as told by your doctor or pharmacist. °· Stay active if told by your doctor. The elderly and people with severe heart failure should talk with a doctor about physical activity. °· Eat heart-healthy foods. Choose foods that are without trans fat and are low in saturated fat, cholesterol, and salt (sodium). This includes fresh or frozen fruits and vegetables, fish, lean meats, fat-free or low-fat dairy foods, whole grains, and high-fiber foods. Lentils and dried peas and beans (legumes) are also good choices. °· Limit salt if told by your doctor. °· Cook in a healthy way. Roast, grill, broil, bake, poach, steam, or stir-fry foods. °· Limit fluids as told by your doctor. °· Weigh yourself every morning. Do this after you pee (urinate) and before you eat breakfast. Write down your weight to give to your doctor. °· Take your blood pressure and write it down if your doctor tells you to. °· Ask your doctor how to check your pulse. Check your pulse as told. °· Lose weight if told by your doctor. °· Stop smoking or chewing tobacco. Do not use gum or patches that help you quit without your doctor's approval. °· Schedule and go to doctor visits as told. °· Nonpregnant women should have no more than 1 drink a day. Men should have no more than 2 drinks a day. Talk to your doctor about drinking alcohol. °· Stop illegal drug use. °· Stay current with shots (immunizations). °· Manage your health conditions as told by your  doctor. °· Learn to manage your stress. °· Rest when you are tired. °· If it is really hot outside: °? Avoid intense activities. °? Use air conditioning or fans, or get in a cooler place. °? Avoid caffeine and alcohol. °? Wear loose-fitting, lightweight, and light-colored clothing. °· If it is really cold outside: °? Avoid intense activities. °? Layer your clothing. °? Wear mittens or gloves, a hat, and a scarf when going outside. °? Avoid alcohol. °· Learn about heart failure and get support as needed. °· Get help to maintain or improve your quality of life and your ability to care for yourself as needed. °Contact a doctor if: °· You gain weight quickly. °· You are more short of breath than usual. °· You cannot do your normal activities. °· You tire easily. °· You cough more than normal, especially with activity. °· You have any or more puffiness (swelling) in areas such as your hands, feet, ankles, or belly (abdomen). °· You cannot sleep because it is hard to breathe. °· You feel like your heart is beating fast (palpitations). °· You get dizzy or light-headed when you stand up. °Get help right away if: °· You have trouble breathing. °· There is a change in mental status, such as becoming less alert or not being able to focus. °· You have chest pain or discomfort. °· You faint. °This information is not intended to replace advice given to you by your health care provider. Make sure you   discuss any questions you have with your health care provider. °Document Released: 03/22/2008 Document Revised: 11/19/2015 Document Reviewed: 07/30/2012 °[employer] Interactive Patient Education © 2017 [employer] ° °

## 2017-03-09 NOTE — Progress Notes (Signed)
Subjective:  Patient ID: John Hogan, male    DOB: 1950-11-05  Age: 66 y.o. MRN: [id number]  CC: Congestive Heart Failure and Hypertension   HPI John Hogan presents for f/up - he feels well today.  Outpatient Medications Prior to Visit  Medication Sig Dispense Refill  . ALPHAGAN P 0.1 % SOLN PUT 1 DROP IN BOTH EYES TWICE A DAY  4  . pravastatin (PRAVACHOL) 80 MG tablet Take 1 tablet (80 mg total) by mouth daily. 90 tablet 3  . Travoprost, BAK Free, (TRAVATAN) 0.004 % SOLN ophthalmic solution Place 1 drop into both eyes at bedtime.     . triamcinolone cream (KENALOG) 0.1 % APPLY TO AFFECTED AREA TWICE A DAY 30 g 0  . zolpidem (AMBIEN) 5 MG tablet TAKE 1 TABLET BY MOUTH EVERY DAY AT BEDTIME AS NEEDED FOR SLEEP 30 tablet 3  . allopurinol (ZYLOPRIM) 300 MG tablet Take 1 tablet (300 mg total) by mouth daily. Must keep appt for Sept for future refills 30 tablet 0  . amLODipine (NORVASC) 10 MG tablet Take 1 tablet (10 mg total) by mouth daily. Must keep appt for Sept for future refills 30 tablet 0  . colchicine 0.6 MG tablet TAKE 1 TABLET BY MOUTH TWICE DAILY 60 tablet 3  . Crisaborole (EUCRISA) 2 % OINT Apply 1 Act topically 2 (two) times daily. 60 g 11  . dorzolamide (TRUSOPT) 2 % ophthalmic solution INSTILL 1 DROP INTO BOTH EYES TWICE A DAY  4  . dorzolamide-timolol (COSOPT) 22.3-6.8 MG/ML ophthalmic solution Place 1 drop into both eyes 2 (two) times daily.     Marland Kitchen losartan (COZAAR) 50 MG tablet Take 1 tablet (50 mg total) by mouth daily. Must keep appt for Sept for future refills 30 tablet 0  . timolol (TIMOPTIC) 0.5 % ophthalmic solution PLACE 1 DROP INTO BOTH EYES IN THE MORNING  4   No facility-administered medications prior to visit.     ROS Review of Systems  Constitutional: Negative.  Negative for activity change, diaphoresis, fatigue and unexpected weight change.  HENT: Negative.   Eyes: Negative for visual disturbance.  Respiratory: Negative.  Negative for chest  tightness, shortness of breath and wheezing.   Cardiovascular: Negative.  Negative for chest pain, palpitations and leg swelling.  Gastrointestinal: Negative for abdominal pain, constipation, diarrhea, nausea and vomiting.  Endocrine: Negative.   Genitourinary: Negative.  Negative for difficulty urinating.  Musculoskeletal: Negative.   Skin: Negative.   Allergic/Immunologic: Negative.   Neurological: Negative.  Negative for dizziness, weakness and light-headedness.  Hematological: Negative for adenopathy. Does not bruise/bleed easily.  Psychiatric/Behavioral: Negative.     Objective:  BP 140/70 (BP Location: Left Arm, Patient Position: Sitting, Cuff Size: Large)   Pulse (!) 45   Temp 98 F (36.7 C) (Oral)   Resp 16   Ht 5\' 11"  (1.803 m)   Wt 232 lb (105.2 kg)   SpO2 98%   BMI 32.36 kg/m   BP Readings from Last 3 Encounters:  03/09/17 140/70  07/11/16 138/82  07/07/16 (!) 144/80    Wt Readings from Last 3 Encounters:  03/09/17 232 lb (105.2 kg)  07/11/16 226 lb (102.5 kg)  07/07/16 228 lb (103.4 kg)    Physical Exam  Constitutional: He is oriented to person, place, and time. No distress.  HENT:  Mouth/Throat: Oropharynx is clear and moist. No oropharyngeal exudate.  Eyes: Conjunctivae are normal. Right eye exhibits no discharge. Left eye exhibits no discharge. No scleral icterus.  Neck: Normal range of motion. Neck supple. No JVD present. No thyromegaly present.  Cardiovascular: Normal rate, regular rhythm and intact distal pulses.  Exam reveals no gallop and no friction rub.   No murmur heard. Pulmonary/Chest: Effort normal and breath sounds normal. No respiratory distress. He has no wheezes. He has no rales. He exhibits no tenderness.  Abdominal: Soft. Bowel sounds are normal. He exhibits no distension and no mass. There is no tenderness. There is no rebound and no guarding.  Musculoskeletal: Normal range of motion. He exhibits no edema, tenderness or deformity.    Lymphadenopathy:    He has no cervical adenopathy.  Neurological: He is alert and oriented to person, place, and time.  Skin: Skin is warm and dry. No rash noted. He is not diaphoretic. No erythema. No pallor.  Vitals reviewed.   Lab Results  Component Value Date   WBC 4.2 07/07/2016   HGB 14.4 07/07/2016   HCT 41.7 07/07/2016   PLT 189.0 07/07/2016   GLUCOSE 108 (H) 03/09/2017   CHOL 161 09/19/2016   TRIG 60 09/19/2016   HDL 63 09/19/2016   LDLDIRECT 140.7 02/27/2013   LDLCALC 86 09/19/2016   ALT 17 07/07/2016   AST 18 07/07/2016   NA 139 03/09/2017   K 4.2 03/09/2017   CL 104 03/09/2017   CREATININE 1.93 (H) 03/09/2017   BUN 20 03/09/2017   CO2 29 03/09/2017   TSH 1.35 07/07/2016   PSA 1.45 07/07/2016    US Renal  Result Date: 08/14/2014 CLINICAL DATA:  Renal insufficiency, history of hypertension EXAM: RENAL/URINARY TRACT ULTRASOUND COMPLETE COMPARISON:  None. FINDINGS: Right Kidney: Length: 11.0 cm. Increased renal cortical echogenicity without hydronephrosis. Right mid renal cortical cyst, 1.6 x 1.6 x 1.4 cm Left Kidney: Length: 11.9 cm. Increased renal cortical echogenicity without hydronephrosis. Left mid/inferior simple appearing cyst, 8.5 x 4.4 x 3.8 cm. Bladder: Appears normal for degree of bladder distention. IMPRESSION: Increased renal cortical echogenicity compatible with medical renal disease. No hydronephrosis. Bilateral renal cortical cysts. Electronically Signed   By: Conchita Paris M.D.   On: 08/14/2014 19:35    Assessment & Plan:   John Hogan was seen today for congestive heart failure and hypertension.  Diagnoses and all orders for this visit:  Need for influenza vaccination -     Flu vaccine HIGH DOSE PF (Fluzone High dose)  Idiopathic gout, unspecified chronicity, unspecified site- He has had no episodes of gouty arthropathy. Will continue allopurinol at the current dose. -     Discontinue: allopurinol (ZYLOPRIM) 300 MG tablet; Take 1 tablet (300 mg  total) by mouth daily. Must keep appt for Sept for future refills -     allopurinol (ZYLOPRIM) 300 MG tablet; Take 1 tablet (300 mg total) by mouth daily. Must keep appt for Sept for future refills  Essential hypertension, benign- his blood pressure is well-controlled. Electrolytes are normal. Renal function is stable. We'll continue amlodipine and losartan at the current doses. -     Discontinue: amLODipine (NORVASC) 10 MG tablet; Take 1 tablet (10 mg total) by mouth daily. Must keep appt for Sept for future refills -     Discontinue: losartan (COZAAR) 50 MG tablet; Take 1 tablet (50 mg total) by mouth daily. Must keep appt for Sept for future refills -     Basic metabolic panel; Future -     losartan (COZAAR) 50 MG tablet; Take 1 tablet (50 mg total) by mouth daily. Must keep appt for Sept for future refills -  amLODipine (NORVASC) 10 MG tablet; Take 1 tablet (10 mg total) by mouth daily. Must keep appt for Sept for future refills  COMBINED HEART FAILURE, CHRONIC- he has a normal fluid status and no symptoms suspicious for CHF exacerbation. Will continue losartan -     Discontinue: losartan (COZAAR) 50 MG tablet; Take 1 tablet (50 mg total) by mouth daily. Must keep appt for Sept for future refills -     losartan (COZAAR) 50 MG tablet; Take 1 tablet (50 mg total) by mouth daily. Must keep appt for Sept for future refills  Need for pneumococcal vaccination -     Pneumococcal conjugate vaccine 13-valent  CKD (chronic kidney disease) stage 3, GFR 30-59 ml/min- his renal function is stable. He will continue to avoid nephrotoxic agents.   I have discontinued Mr. Hight's dorzolamide-timolol, colchicine, dorzolamide, timolol, and Crisaborole. I am also having him maintain his Travoprost (BAK Free), ALPHAGAN P, pravastatin, zolpidem, triamcinolone cream, losartan, amLODipine, and allopurinol.  Meds ordered this encounter  Medications  . DISCONTD: allopurinol (ZYLOPRIM) 300 MG tablet    Sig:  Take 1 tablet (300 mg total) by mouth daily. Must keep appt for Sept for future refills    Dispense:  90 tablet    Refill:  1  . DISCONTD: amLODipine (NORVASC) 10 MG tablet    Sig: Take 1 tablet (10 mg total) by mouth daily. Must keep appt for Sept for future refills    Dispense:  90 tablet    Refill:  1  . DISCONTD: losartan (COZAAR) 50 MG tablet    Sig: Take 1 tablet (50 mg total) by mouth daily. Must keep appt for Sept for future refills    Dispense:  90 tablet    Refill:  1  . losartan (COZAAR) 50 MG tablet    Sig: Take 1 tablet (50 mg total) by mouth daily. Must keep appt for Sept for future refills    Dispense:  90 tablet    Refill:  1  . amLODipine (NORVASC) 10 MG tablet    Sig: Take 1 tablet (10 mg total) by mouth daily. Must keep appt for Sept for future refills    Dispense:  90 tablet    Refill:  1  . allopurinol (ZYLOPRIM) 300 MG tablet    Sig: Take 1 tablet (300 mg total) by mouth daily. Must keep appt for Sept for future refills    Dispense:  90 tablet    Refill:  1     Follow-up: Return in about 6 months (around 09/06/2017).  Scarlette Calico, MD

## 2017-03-15 ENCOUNTER — Other Ambulatory Visit: Payer: Self-pay | Admitting: Internal Medicine

## 2017-03-16 NOTE — Telephone Encounter (Signed)
rx faxed to [facility].  

## 2017-04-12 ENCOUNTER — Encounter: Payer: Self-pay | Admitting: Internal Medicine

## 2017-04-20 ENCOUNTER — Encounter: Payer: Self-pay | Admitting: Internal Medicine

## 2017-05-08 ENCOUNTER — Other Ambulatory Visit: Payer: Self-pay

## 2017-05-08 ENCOUNTER — Ambulatory Visit (AMBULATORY_SURGERY_CENTER): Payer: Self-pay | Admitting: *Deleted

## 2017-05-08 VITALS — Ht 71.0 in | Wt 235.0 lb

## 2017-05-08 DIAGNOSIS — Z86010 Personal history of colonic polyps: Secondary | ICD-10-CM

## 2017-05-08 DIAGNOSIS — Z800 Family history of malignant neoplasm of digestive organs: Secondary | ICD-10-CM

## 2017-05-08 MED ORDER — NA SULFATE-K SULFATE-MG SULF 17.5-3.13-1.6 GM/177ML PO SOLN: ORAL | 0 refills | Status: DC

## 2017-05-08 NOTE — Progress Notes (Signed)
Patient denies any allergies to eggs or soy. Patient denies any problems with anesthesia/sedation. Patient denies any oxygen use at home. Patient denies taking any diet/weight loss medications or blood thinners. patient declined emmi.

## 2017-05-24 ENCOUNTER — Encounter: Payer: Self-pay | Admitting: Internal Medicine

## 2017-05-29 ENCOUNTER — Encounter: Payer: Self-pay | Admitting: Internal Medicine

## 2017-05-29 ENCOUNTER — Other Ambulatory Visit: Payer: Self-pay

## 2017-05-29 ENCOUNTER — Ambulatory Visit (AMBULATORY_SURGERY_CENTER): Payer: 59 | Admitting: Internal Medicine

## 2017-05-29 VITALS — BP 121/85 | HR 43 | Temp 97.3°F | Resp 12 | Ht 71.0 in | Wt 235.0 lb

## 2017-05-29 DIAGNOSIS — Z800 Family history of malignant neoplasm of digestive organs: Secondary | ICD-10-CM

## 2017-05-29 DIAGNOSIS — Z86010 Personal history of colonic polyps: Secondary | ICD-10-CM

## 2017-05-29 DIAGNOSIS — D123 Benign neoplasm of transverse colon: Secondary | ICD-10-CM | POA: Diagnosis not present

## 2017-05-29 DIAGNOSIS — D124 Benign neoplasm of descending colon: Secondary | ICD-10-CM | POA: Diagnosis not present

## 2017-05-29 DIAGNOSIS — D122 Benign neoplasm of ascending colon: Secondary | ICD-10-CM

## 2017-05-29 MED ORDER — SODIUM CHLORIDE 0.9 % IV SOLN
500.0000 mL | INTRAVENOUS | Status: DC
Start: 2017-05-29 — End: 2017-05-29

## 2017-05-29 NOTE — Progress Notes (Signed)
Pt's states no medical or surgical changes since previsit or office visit. 

## 2017-05-29 NOTE — Progress Notes (Signed)
Called to room to assist during endoscopic procedure.  Patient ID and intended procedure confirmed with present staff. Received instructions for my participation in the procedure from the performing physician.  

## 2017-05-29 NOTE — Progress Notes (Signed)
Report to PACU, RN, vss, BBS= Clear.  

## 2017-05-29 NOTE — Patient Instructions (Addendum)
YOU HAD AN ENDOSCOPIC PROCEDURE TODAY AT [facility]:   Refer to the procedure report that was given to you for any specific questions about what was found during the examination.  If the procedure report does not answer your questions, please call your gastroenterologist to clarify.  If you requested that your care partner not be given the details of your procedure findings, then the procedure report has been included in a sealed envelope for you to review at your convenience later.  YOU SHOULD EXPECT: Some feelings of bloating in the abdomen. Passage of more gas than usual.  Walking can help get rid of the air that was put into your GI tract during the procedure and reduce the bloating. If you had a lower endoscopy (such as a colonoscopy or flexible sigmoidoscopy) you may notice spotting of blood in your stool or on the toilet paper. If you underwent a bowel prep for your procedure, you may not have a normal bowel movement for a few days.  Please Note:  You might notice some irritation and congestion in your nose or some drainage.  This is from the oxygen used during your procedure.  There is no need for concern and it should clear up in a day or so.  SYMPTOMS TO REPORT IMMEDIATELY:   Following lower endoscopy (colonoscopy or flexible sigmoidoscopy):  Excessive amounts of blood in the stool  Significant tenderness or worsening of abdominal pains  Swelling of the abdomen that is new, acute  Fever of 100F or higher  For urgent or emergent issues, a gastroenterologist can be reached at any hour by calling [phone].   DIET:  We do recommend a small meal at first, but then you may proceed to your regular diet.  Drink plenty of fluids but you should avoid alcoholic beverages for 24 hours.  ACTIVITY:  You should plan to take it easy for the rest of today and you should NOT DRIVE or use heavy machinery until tomorrow (because of the sedation medicines used during the test).     FOLLOW UP: Our staff will call the number listed on your records the next business day following your procedure to check on you and address any questions or concerns that you may have regarding the information given to you following your procedure. If we do not reach you, we will leave a message.  However, if you are feeling well and you are not experiencing any problems, there is no need to return our call.  We will assume that you have returned to your regular daily activities without incident.  If any biopsies were taken you will be contacted by phone or by letter within the next 1-3 weeks.  Please call us at [phone] if you have not heard about the biopsies in 3 weeks.   Await for biopsy results to determine next repeat Colonoscopy screening Polyps (handout given) .Marland KitchenNo ibuprofen, naproxen or other non-steroidal anti-inflammatory drugs for 2 weeks after polyp removal. Tylenol okay if needed. Hemorrhoids (handout given)  SIGNATURES/CONFIDENTIALITY: You and/or your care partner have signed paperwork which will be entered into your electronic medical record.  These signatures attest to the fact that that the information above on your After Visit Summary has been reviewed and is understood.  Full responsibility of the confidentiality of this discharge information lies with you and/or your care-partner.

## 2017-05-29 NOTE — Op Note (Signed)
[place] Patient Name: John Hogan Procedure Date: 05/29/2017 2:54 PM MRN: [id number] Endoscopist: Jerene Bears , MD Age: 66 Referring MD:  Date of Birth: 07-10-1950 Gender: Male Account #: [id number] Procedure:                Colonoscopy Indications:              Surveillance: Personal history of adenomatous                            polyps on last colonoscopy 5 years ago, Family                            history of colon cancer in multiple first-degree                            relatives Medicines:                Monitored Anesthesia Care Procedure:                Pre-Anesthesia Assessment:                           - Prior to the procedure, a History and Physical                            was performed, and patient medications and                            allergies were reviewed. The patient's tolerance of                            previous anesthesia was also reviewed. The risks                            and benefits of the procedure and the sedation                            options and risks were discussed with the patient.                            All questions were answered, and informed consent                            was obtained. Prior Anticoagulants: The patient has                            taken no previous anticoagulant or antiplatelet                            agents. ASA Grade Assessment: II - A patient with                            mild systemic disease. After reviewing the risks  and benefits, the patient was deemed in                            satisfactory condition to undergo the procedure.                           After obtaining informed consent, the colonoscope                            was passed under direct vision. Throughout the                            procedure, the patient's blood pressure, pulse, and                            oxygen saturations were monitored continuously. The                             Colonoscope was introduced through the anus and                            advanced to the the cecum, identified by                            appendiceal orifice and ileocecal valve. The                            colonoscopy was performed without difficulty. The                            patient tolerated the procedure well. The quality                            of the bowel preparation was good. The ileocecal                            valve, appendiceal orifice, and rectum were                            photographed. Scope In: 2:56:38 PM Scope Out: 3:23:44 PM Scope Withdrawal Time: 0 hours 20 minutes 8 seconds  Total Procedure Duration: 0 hours 27 minutes 6 seconds  Findings:                 The digital rectal exam was normal.                           A 8 mm polyp was found in the ascending colon. The                            polyp was pedunculated. The polyp was removed with                            a hot snare. Resection and retrieval were complete.  Five sessile polyps were found in the ascending                            colon. The polyps were 4 to 7 mm in size. These                            polyps were removed with a cold snare. Resection                            and retrieval were complete.                           Seven pedunculated and sessile polyps were found in                            the transverse colon. The polyps were 4 to 7 mm in                            size. These polyps were removed with a cold snare.                            Resection and retrieval were complete.                           A 5 mm polyp was found in the descending colon. The                            polyp was sessile. The polyp was removed with a                            cold snare. Resection and retrieval were complete.                           Internal hemorrhoids were found during                            retroflexion. The  hemorrhoids were small. Complications:            No immediate complications. Estimated Blood Loss:     Estimated blood loss was minimal. Impression:               - One 8 mm polyp in the ascending colon, removed                            with a hot snare. Resected and retrieved.                           - Five 4 to 7 mm polyps in the ascending colon,                            removed with a cold snare. Resected and retrieved.                           -  Seven 4 to 7 mm polyps in the transverse colon,                            removed with a cold snare. Resected and retrieved.                           - One 5 mm polyp in the descending colon, removed                            with a cold snare. Resected and retrieved.                           - Internal hemorrhoids. Recommendation:           - Patient has a contact number available for                            emergencies. The signs and symptoms of potential                            delayed complications were discussed with the                            patient. Return to normal activities tomorrow.                            Written discharge instructions were provided to the                            patient.                           - Resume previous diet.                           - Continue present medications.                           - Await pathology results.                           - Repeat colonoscopy is recommended for                            surveillance. The colonoscopy date will be                            determined after pathology results from today's                            exam become available for review.                           - No ibuprofen, naproxen, or other non-steroidal  anti-inflammatory drugs for 2 weeks after polyp                            removal. Jerene Bears, MD 05/29/2017 3:39:38 PM This report has been signed electronically.

## 2017-05-30 ENCOUNTER — Telehealth: Payer: Self-pay | Admitting: *Deleted

## 2017-05-30 LAB — HM COLONOSCOPY

## 2017-05-30 NOTE — Telephone Encounter (Signed)
  Follow up Call-  Call back number 05/29/2017  Post procedure Call Back phone  # [phone]  Permission to leave phone message Yes  Some recent data might be hidden     Patient questions:  Do you have a fever, pain , or abdominal swelling? No. Pain Score  0 *  Have you tolerated food without any problems? Yes.    Have you been able to return to your normal activities? Yes.    Do you have any questions about your discharge instructions: Diet   No. Medications  No. Follow up visit  No.  Do you have questions or concerns about your Care? No.  Actions: * If pain score is 4 or above: No action needed, pain <4. Pt states her is seeing a small amount of blood in the stool- he has hemorrhoids and had 14 polyps removed from the colon yesterday- pt denies any pain- pt was advised to monitor the blood- a small amount is normal however if the amount increases or he has changes in s/s like pain, we need to be notified immediately  [place]

## 2017-06-01 ENCOUNTER — Encounter: Payer: Self-pay | Admitting: Internal Medicine

## 2017-06-17 ENCOUNTER — Other Ambulatory Visit: Payer: Self-pay | Admitting: Internal Medicine

## 2017-07-04 ENCOUNTER — Other Ambulatory Visit: Payer: Self-pay | Admitting: Internal Medicine

## 2017-07-04 DIAGNOSIS — E785 Hyperlipidemia, unspecified: Secondary | ICD-10-CM

## 2017-07-21 ENCOUNTER — Other Ambulatory Visit: Payer: Self-pay | Admitting: Internal Medicine

## 2017-07-24 ENCOUNTER — Ambulatory Visit: Payer: 59 | Admitting: Cardiology

## 2017-08-21 NOTE — Progress Notes (Signed)
HPI The patient presents for followup of aortic insufficiency. He has had previously reduced ejection fraction but it has been improved a followup. The last echo was earlier this month with preserved EF and mild AI.  Since I last saw him he is felt well.  He still working.  He does a little walking. The patient denies any new symptoms such as chest discomfort, neck or arm discomfort. There has been no new shortness of breath, PND or orthopnea. There have been no reported palpitations, presyncope or syncope.   No Known Allergies  Current Outpatient Medications  Medication Sig Dispense Refill  . allopurinol (ZYLOPRIM) 300 MG tablet Take 1 tablet (300 mg total) by mouth daily. Must keep appt for Sept for future refills 90 tablet 1  . ALPHAGAN P 0.1 % SOLN PUT 1 DROP IN BOTH EYES TWICE A DAY  4  . amLODipine (NORVASC) 10 MG tablet Take 1 tablet (10 mg total) by mouth daily. Must keep appt for Sept for future refills 90 tablet 1  . losartan (COZAAR) 50 MG tablet Take 1 tablet (50 mg total) by mouth daily. Must keep appt for Sept for future refills 90 tablet 1  . pravastatin (PRAVACHOL) 20 MG tablet TAKE 1 TABLET BY MOUTH DAILY 90 tablet 1  . Travoprost, BAK Free, (TRAVATAN) 0.004 % SOLN ophthalmic solution Place 1 drop into both eyes at bedtime.     Marland Kitchen zolpidem (AMBIEN) 5 MG tablet TAKE 1 TABLET BY MOUTH AT BEDTIME AS NEEDED FOR SLEEP 30 tablet 2   No current facility-administered medications for this visit.     Past Medical History:  Diagnosis Date  . AI (aortic insufficiency)   . Chronic kidney disease   . Gout   . H/O exercise stress test    Myoview 01/03/12: No scar or ischemia, EF 53%  . Hyperlipidemia   . Hypertension   . Hypertensive cardiomyopathy ([place])    EF previously 35%;  echo 12/2012 EF 55%, mild AI, ascending aorta mildly dilated, mild LAE    Past Surgical History:  Procedure Laterality Date  . COLONOSCOPY    . HERNIA REPAIR     Umbilical  . WRIST SURGERY     right      ROS:  As stated in the HPI and negative for all other systems.  PHYSICAL EXAM BP 116/74   Pulse (!) 54   Ht 5\' 11"  (1.803 m)   Wt 235 lb (106.6 kg)   BMI 32.78 kg/m   GENERAL:  Well appearing NECK:  No jugular venous distention, waveform within normal limits, carotid upstroke brisk and symmetric, no bruits, no thyromegaly LUNGS:  Clear to auscultation bilaterally CHEST:  Unremarkable HEART:  PMI not displaced or sustained,S1 and S2 within normal limits, no S3, no S4, no clicks, no rubs, 2 out of 6 diastolic heart murmur heard best at the third left intercostal space, no systolic murmurs ABD:  Flat, positive bowel sounds normal in frequency in pitch, no bruits, no rebound, no guarding, no midline pulsatile mass, no hepatomegaly, no splenomegaly EXT:  2 plus pulses throughout, no edema, no cyanosis no clubbing   EKG:    Sinus bradycardia, rate 54, left axis , intervals within normal limits, no acute ST T wave changes. 08/24/2017  Lab Results  Component Value Date   CHOL 161 09/19/2016   TRIG 60 09/19/2016   HDL 63 09/19/2016   LDLCALC 86 09/19/2016   LDLDIRECT 140.7 02/27/2013    ASSESSMENT AND PLAN  Aortic insufficiency - This has been mild on echo last year.  I would not suspect that it has changed by physical exam and he has no symptoms.  I will follow this clinically and probably repeat an echo next year.    HTN - The blood pressure is at target.  No change in therapy.   Dyslipidemia -  At the last visit I increased Pravachol.  The LDL/HDL was OK.  I will order a repeat lipid when he is fasting.   CKD -   I note that his creatinine was increased to 1.93 in September and this was up from earlier in the year.  I scheduled follow-up with his primary provider and he will have repeat labs prior to that.

## 2017-08-24 ENCOUNTER — Ambulatory Visit (INDEPENDENT_AMBULATORY_CARE_PROVIDER_SITE_OTHER): Payer: Managed Care, Other (non HMO) | Admitting: Cardiology

## 2017-08-24 ENCOUNTER — Encounter: Payer: Self-pay | Admitting: Cardiology

## 2017-08-24 VITALS — BP 116/74 | HR 54 | Ht 71.0 in | Wt 235.0 lb

## 2017-08-24 DIAGNOSIS — N183 Chronic kidney disease, stage 3 unspecified: Secondary | ICD-10-CM | POA: Insufficient documentation

## 2017-08-24 DIAGNOSIS — E785 Hyperlipidemia, unspecified: Secondary | ICD-10-CM

## 2017-08-24 DIAGNOSIS — I351 Nonrheumatic aortic (valve) insufficiency: Secondary | ICD-10-CM | POA: Diagnosis not present

## 2017-08-24 NOTE — Patient Instructions (Signed)
Medication Instructions:  Continue current medications  If you need a refill on your cardiac medications before your next appointment, please call your pharmacy.  Labwork: None Ordered   Testing/Procedures: None Ordered  Special Instructions: Please have BMP, CBC and Fasting Lipids at Dr Ronnald Ramp office on Wednesday March 13th @ 10:00 am.   Follow-Up: Your physician wants you to follow-up in: 1 Year. You should receive a reminder letter in the mail two months in advance. If you do not receive a letter, please call our office [phone].    Thank you for choosing [facility]!!

## 2017-09-06 ENCOUNTER — Other Ambulatory Visit (INDEPENDENT_AMBULATORY_CARE_PROVIDER_SITE_OTHER): Payer: Managed Care, Other (non HMO)

## 2017-09-06 ENCOUNTER — Encounter: Payer: Self-pay | Admitting: Internal Medicine

## 2017-09-06 ENCOUNTER — Ambulatory Visit (INDEPENDENT_AMBULATORY_CARE_PROVIDER_SITE_OTHER): Payer: Managed Care, Other (non HMO) | Admitting: Internal Medicine

## 2017-09-06 VITALS — BP 140/80 | HR 54 | Temp 97.6°F | Resp 16 | Ht 71.0 in | Wt 240.2 lb

## 2017-09-06 DIAGNOSIS — N183 Chronic kidney disease, stage 3 unspecified: Secondary | ICD-10-CM

## 2017-09-06 DIAGNOSIS — E785 Hyperlipidemia, unspecified: Secondary | ICD-10-CM

## 2017-09-06 DIAGNOSIS — I10 Essential (primary) hypertension: Secondary | ICD-10-CM

## 2017-09-06 DIAGNOSIS — E559 Vitamin D deficiency, unspecified: Secondary | ICD-10-CM | POA: Insufficient documentation

## 2017-09-06 LAB — CBC WITH DIFFERENTIAL/PLATELET
BASOS PCT: 1.1 % (ref 0.0–3.0)
Basophils Absolute: 0 10*3/uL (ref 0.0–0.1)
EOS PCT: 9.3 % — AB (ref 0.0–5.0)
Eosinophils Absolute: 0.3 10*3/uL (ref 0.0–0.7)
HCT: 41.7 % (ref 39.0–52.0)
Hemoglobin: 14 g/dL (ref 13.0–17.0)
LYMPHS ABS: 1.2 10*3/uL (ref 0.7–4.0)
Lymphocytes Relative: 36.4 % (ref 12.0–46.0)
MCHC: 33.6 g/dL (ref 30.0–36.0)
MCV: 93.1 fl (ref 78.0–100.0)
MONO ABS: 0.3 10*3/uL (ref 0.1–1.0)
MONOS PCT: 7.5 % (ref 3.0–12.0)
NEUTROS ABS: 1.6 10*3/uL (ref 1.4–7.7)
NEUTROS PCT: 45.7 % (ref 43.0–77.0)
PLATELETS: 177 10*3/uL (ref 150.0–400.0)
RBC: 4.48 Mil/uL (ref 4.22–5.81)
RDW: 14.1 % (ref 11.5–15.5)
WBC: 3.4 10*3/uL — ABNORMAL LOW (ref 4.0–10.5)

## 2017-09-06 LAB — COMPREHENSIVE METABOLIC PANEL
ALT: 16 U/L (ref 0–53)
AST: 16 U/L (ref 0–37)
Albumin: 4.4 g/dL (ref 3.5–5.2)
Alkaline Phosphatase: 69 U/L (ref 39–117)
BUN: 17 mg/dL (ref 6–23)
CHLORIDE: 106 meq/L (ref 96–112)
CO2: 29 meq/L (ref 19–32)
Calcium: 9.6 mg/dL (ref 8.4–10.5)
Creatinine, Ser: 1.59 mg/dL — ABNORMAL HIGH (ref 0.40–1.50)
GFR: 55.98 mL/min — AB (ref >60.00)
GLUCOSE: 108 mg/dL — AB (ref 70–99)
POTASSIUM: 4.3 meq/L (ref 3.5–5.1)
SODIUM: 141 meq/L (ref 135–145)
Total Bilirubin: 1.1 mg/dL (ref 0.2–1.2)
Total Protein: 7.9 g/dL (ref 6.0–8.3)

## 2017-09-06 LAB — URINALYSIS, ROUTINE W REFLEX MICROSCOPIC
Bilirubin Urine: NEGATIVE
HGB URINE DIPSTICK: NEGATIVE
KETONES UR: NEGATIVE
Leukocytes, UA: NEGATIVE
NITRITE: NEGATIVE
RBC / HPF: NONE SEEN (ref 0–?)
SPECIFIC GRAVITY, URINE: 1.015 (ref 1.000–1.030)
Total Protein, Urine: NEGATIVE
UROBILINOGEN UA: 0.2 (ref 0.0–1.0)
Urine Glucose: NEGATIVE
pH: 6.5 (ref 5.0–8.0)

## 2017-09-06 LAB — VITAMIN D 25 HYDROXY (VIT D DEFICIENCY, FRACTURES): VITD: 27.84 ng/mL — ABNORMAL LOW (ref 30.00–100.00)

## 2017-09-06 LAB — LIPID PANEL
CHOLESTEROL: 166 mg/dL (ref 0–200)
HDL: 67.7 mg/dL (ref >39.00)
LDL CALC: 89 mg/dL (ref 0–99)
NonHDL: 97.81
Total CHOL/HDL Ratio: 2
Triglycerides: 44 mg/dL (ref 0.0–149.0)
VLDL: 8.8 mg/dL (ref 0.0–40.0)

## 2017-09-06 MED ORDER — CHOLECALCIFEROL 50 MCG (2000 UT) PO TABS: 1.0000 | ORAL_TABLET | Freq: Every day | ORAL | 1 refills | Status: DC

## 2017-09-06 NOTE — Progress Notes (Signed)
Subjective:  Patient ID: John Hogan, male    DOB: 1950/08/25  Age: 67 y.o. MRN: [id number]  CC: Hypertension   HPI John Hogan presents for f/up - He feels well and offers no complaints.  He thinks his blood pressure has been relatively well controlled.  He denies any recent episodes of headache, blurred vision, chest pain, shortness of breath, palpitations, edema, or fatigue.  Outpatient Medications Prior to Visit  Medication Sig Dispense Refill  . allopurinol (ZYLOPRIM) 300 MG tablet Take 1 tablet (300 mg total) by mouth daily. Must keep appt for Sept for future refills 90 tablet 1  . ALPHAGAN P 0.1 % SOLN PUT 1 DROP IN BOTH EYES TWICE A DAY  4  . amLODipine (NORVASC) 10 MG tablet Take 1 tablet (10 mg total) by mouth daily. Must keep appt for Sept for future refills 90 tablet 1  . losartan (COZAAR) 50 MG tablet Take 1 tablet (50 mg total) by mouth daily. Must keep appt for Sept for future refills 90 tablet 1  . pravastatin (PRAVACHOL) 20 MG tablet TAKE 1 TABLET BY MOUTH DAILY 90 tablet 1  . Travoprost, BAK Free, (TRAVATAN) 0.004 % SOLN ophthalmic solution Place 1 drop into both eyes at bedtime.     Marland Kitchen zolpidem (AMBIEN) 5 MG tablet TAKE 1 TABLET BY MOUTH AT BEDTIME AS NEEDED FOR SLEEP 30 tablet 2   No facility-administered medications prior to visit.     ROS Review of Systems  Constitutional: Negative for appetite change, diaphoresis, fatigue and unexpected weight change.  HENT: Negative.   Eyes: Negative for visual disturbance.  Respiratory: Negative for cough, chest tightness, shortness of breath and wheezing.   Cardiovascular: Negative for chest pain, palpitations and leg swelling.  Gastrointestinal: Negative for abdominal pain, constipation, diarrhea, nausea and vomiting.  Genitourinary: Negative.  Negative for difficulty urinating and urgency.  Musculoskeletal: Negative.  Negative for back pain, myalgias and neck pain.  Skin: Negative.  Negative for color change,  pallor and rash.  Neurological: Negative.  Negative for dizziness, weakness, light-headedness and headaches.  Hematological: Negative for adenopathy. Does not bruise/bleed easily.  Psychiatric/Behavioral: Negative.     Objective:  BP 140/80 (BP Location: Left Arm, Patient Position: Sitting, Cuff Size: Large)   Pulse (!) 54   Temp 97.6 F (36.4 C) (Oral)   Resp 16   Ht 5\' 11"  (1.803 m)   Wt 240 lb 4 oz (109 kg)   SpO2 97%   BMI 33.51 kg/m   BP Readings from Last 3 Encounters:  09/06/17 140/80  08/24/17 116/74  05/29/17 121/85    Wt Readings from Last 3 Encounters:  09/06/17 240 lb 4 oz (109 kg)  08/24/17 235 lb (106.6 kg)  05/29/17 235 lb (106.6 kg)    Physical Exam  Constitutional: He is oriented to person, place, and time. No distress.  HENT:  Mouth/Throat: Oropharynx is clear and moist. No oropharyngeal exudate.  Eyes: Conjunctivae are normal. Left eye exhibits no discharge. No scleral icterus.  Neck: Normal range of motion. Neck supple. No JVD present. No thyromegaly present.  Cardiovascular: Normal rate, regular rhythm and normal heart sounds. Exam reveals no gallop and no friction rub.  No murmur heard. Pulmonary/Chest: Effort normal and breath sounds normal. No respiratory distress. He has no wheezes. He has no rales.  Abdominal: Soft. Bowel sounds are normal. He exhibits no distension and no mass. There is no tenderness. There is no guarding.  Musculoskeletal: Normal range of motion. He exhibits  no edema or tenderness.  Lymphadenopathy:    He has no cervical adenopathy.  Neurological: He is alert and oriented to person, place, and time.  Skin: Skin is warm and dry. No rash noted. He is not diaphoretic. No erythema. No pallor.  Psychiatric: He has a normal mood and affect. His behavior is normal. Judgment and thought content normal.  Vitals reviewed.   Lab Results  Component Value Date   WBC 3.4 (L) 09/06/2017   HGB 14.0 09/06/2017   HCT 41.7 09/06/2017    PLT 177.0 09/06/2017   GLUCOSE 108 (H) 09/06/2017   CHOL 166 09/06/2017   TRIG 44.0 09/06/2017   HDL 67.70 09/06/2017   LDLDIRECT 140.7 02/27/2013   LDLCALC 89 09/06/2017   ALT 16 09/06/2017   AST 16 09/06/2017   NA 141 09/06/2017   K 4.3 09/06/2017   CL 106 09/06/2017   CREATININE 1.59 (H) 09/06/2017   BUN 17 09/06/2017   CO2 29 09/06/2017   TSH 1.62 09/06/2017   PSA 1.45 07/07/2016    US Renal  Result Date: 08/14/2014 CLINICAL DATA:  Renal insufficiency, history of hypertension EXAM: RENAL/URINARY TRACT ULTRASOUND COMPLETE COMPARISON:  None. FINDINGS: Right Kidney: Length: 11.0 cm. Increased renal cortical echogenicity without hydronephrosis. Right mid renal cortical cyst, 1.6 x 1.6 x 1.4 cm Left Kidney: Length: 11.9 cm. Increased renal cortical echogenicity without hydronephrosis. Left mid/inferior simple appearing cyst, 8.5 x 4.4 x 3.8 cm. Bladder: Appears normal for degree of bladder distention. IMPRESSION: Increased renal cortical echogenicity compatible with medical renal disease. No hydronephrosis. Bilateral renal cortical cysts. Electronically Signed   By: Conchita Paris M.D.   On: 08/14/2014 19:35    Assessment & Plan:   John Hogan was seen today for hypertension.  Diagnoses and all orders for this visit:  Hyperlipidemia with target LDL less than 100- He has achieved his LDL goal and is doing well on the statin. -     Comprehensive metabolic panel; Future -     Lipid panel; Future -     Thyroid Panel With TSH; Future  CKD (chronic kidney disease) stage 3, GFR 30-59 ml/min (HCC)- His renal function is stable.  His PTH level is mildly elevated but I think this is related to the low vitamin D level and not renal disease.  Will continue to maintain good blood pressure control.  He agrees to avoid nephrotoxic agents. -     CBC with Differential/Platelet; Future -     Comprehensive metabolic panel; Future -     Urinalysis, Routine w reflex microscopic; Future -     VITAMIN D 25  Hydroxy (Vit-D Deficiency, Fractures); Future -     PTH, intact and calcium; Future  Essential hypertension, benign- His BP is not quite adequately well controlled.  I will treat the vitamin D D deficiency.  His lab work shows stable renal function with normal electrolytes.  Will continue amlodipine and losartan. -     CBC with Differential/Platelet; Future -     Comprehensive metabolic panel; Future -     Thyroid Panel With TSH; Future -     Urinalysis, Routine w reflex microscopic; Future -     VITAMIN D 25 Hydroxy (Vit-D Deficiency, Fractures); Future  Vitamin D deficiency -     Cholecalciferol 2000 units TABS; Take 1 tablet (2,000 Units total) by mouth daily.   I am having John Hogan start on Cholecalciferol. I am also having him maintain his Travoprost (BAK Free), ALPHAGAN P, losartan, amLODipine,  allopurinol, pravastatin, and zolpidem.  Meds ordered this encounter  Medications  . Cholecalciferol 2000 units TABS    Sig: Take 1 tablet (2,000 Units total) by mouth daily.    Dispense:  90 tablet    Refill:  1     Follow-up: Return in about 3 months (around 12/07/2017).  Scarlette Calico, MD

## 2017-09-06 NOTE — Patient Instructions (Signed)
Chronic Kidney Disease, Adult Chronic kidney disease (CKD) occurs when the kidneys become damaged slowly over a long period of time. The kidneys are a pair of organs that do many important jobs in the body, including:  Removing waste and extra fluid from the blood to make urine.  Making hormones that maintain the amount of fluid in tissues and blood vessels.  Maintaining the right amount of fluids and chemicals in the body.  A small amount of kidney damage may not cause problems, but a large amount of damage may make it hard or impossible for the kidneys to work the way they should. If steps are not taken to slow down kidney damage or to stop it from getting worse, the kidneys may stop working permanently (end-stage renal disease or ESRD). Most of the time, CKD does not go away, but it can often be controlled. People who have CKD are usually able to live normal lives. What are the causes? The most common causes of this condition are diabetes and high blood pressure (hypertension). Other causes include:  Heart and blood vessel (cardiovascular) disease.  Kidney diseases, such as: ? Glomerulonephritis. ? Interstitial nephritis. ? Polycystic kidney disease. ? Renal vascular disease.  Diseases that affect the immune system.  Genetic diseases.  Medicines that damage the kidneys, such as anti-inflammatory medicines.  Being around or being in contact with poisonous (toxic) substances.  A kidney or urinary infection that occurs again and again (recurs).  Vasculitis. This is swelling or inflammation of the blood vessels.  A problem with urine flow that may be caused by: ? Cancer. ? Having kidney stones more than one time. ? An enlarged prostate, in males.  What increases the risk? You are more likely to develop this condition if you:  Are older than age 60.  Are male.  Are African-American, Hispanic, Asian, Pacific Islander, or American Indian.  Are a current or former  smoker.  Are obese.  Have a family history of kidney disease or failure.  Often take medicines that are damaging to the kidneys.  What are the signs or symptoms? Symptoms of this condition include:  Swelling (edema) of the face, legs, ankles, or feet.  Tiredness (lethargy) and having less energy.  Nausea or vomiting.  Confusion or trouble concentrating.  Problems with urination, such as: ? Painful or burning feeling during urination. ? Decreased urine production. ? Frequent urination, especially at night. ? Bloody urine.  Muscle twitches and cramps, especially in the legs.  Shortness of breath.  Weakness.  Loss of appetite.  Metallic taste in the mouth.  Trouble sleeping.  Dry, itchy skin.  A low blood count (anemia).  Pale lining of the eyelids and surface of the eye (conjunctiva).  Symptoms develop slowly and may not be obvious until the kidney damage becomes severe. It is possible to have kidney disease for years without having any symptoms. How is this diagnosed? This condition may be diagnosed based on:  Blood tests.  Urine tests.  Imaging tests, such as an ultrasound or CT scan.  A test in which a sample of tissue is removed from the kidneys to be examined under a microscope (kidney biopsy).  These test results will help your health care provider determine how serious the CKD is. How is this treated? There is no cure for most cases of this condition, but treatment usually relieves symptoms and prevents or slows the progression of the disease. Treatment may include:  Making diet changes, which may require you to   avoid alcohol, salty foods (sodium), and foods that are high in potassium, calcium, and protein.  Medicines: ? To lower blood pressure. ? To control blood glucose. ? To relieve anemia. ? To relieve swelling. ? To protect your bones. ? To improve the balance of electrolytes in your blood.  Removing toxic waste from the body through  types of dialysis, if the kidneys can no longer do their job (kidney failure).  Managing any other conditions that are causing your CKD or making it worse.  Follow these instructions at home: Medicines  Take over-the-counter and prescription medicines only as told by your health care provider. The dose of some medicines that you take may need to be adjusted.  Do not take any new medicines unless approved by your health care provider. Many medicines can worsen your kidney damage.  Do not take any vitamin and mineral supplements unless approved by your health care provider. Many nutritional supplements can worsen your kidney damage. General instructions  Follow your prescribed diet as told by your health care provider.  Do not use any products that contain nicotine or tobacco, such as cigarettes and e-cigarettes. If you need help quitting, ask your health care provider.  Monitor and track your blood pressure at home. Report changes in your blood pressure as told by your health care provider.  If you are being treated for diabetes, monitor and track your blood sugar (blood glucose) levels as told by your health care provider.  Maintain a healthy weight. If you need help with this, ask your health care provider.  Start or continue an exercise plan. Exercise at least 30 minutes a day, 5 days a week.  Keep your immunizations up to date as told by your health care provider.  Keep all follow-up visits as told by your health care provider. This is important. Where to find more information:  American Association of Kidney Patients: www.aakp.org  National Kidney Foundation: www.kidney.org  American Kidney Fund: www.akfinc.org  [facility]: www.lifeoptions.org and www.kidneyschool.org Contact a health care provider if:  Your symptoms get worse.  You develop new symptoms. Get help right away if:  You develop symptoms of ESRD, which  include: ? Headaches. ? Numbness in the hands or feet. ? Easy bruising. ? Frequent hiccups. ? Chest pain. ? Shortness of breath. ? Lack of menstruation, in women.  You have a fever.  You have decreased urine production.  You have pain or bleeding when you urinate. Summary  Chronic kidney disease (CKD) occurs when the kidneys become damaged slowly over a long period of time.  The most common causes of this condition are diabetes and high blood pressure (hypertension).  There is no cure for most cases of this condition, but treatment usually relieves symptoms and prevents or slows the progression of the disease. Treatment may include a combination of medicines and lifestyle changes. This information is not intended to replace advice given to you by your health care provider. Make sure you discuss any questions you have with your health care provider. Document Released: 03/22/2008 Document Revised: 07/21/2016 Document Reviewed: 07/21/2016 Elsevier Interactive Patient Education  2018 Elsevier Inc.  

## 2017-09-07 ENCOUNTER — Encounter: Payer: Self-pay | Admitting: Internal Medicine

## 2017-09-07 LAB — THYROID PANEL WITH TSH
Free Thyroxine Index: 2.2 (ref 1.4–3.8)
T3 UPTAKE: 30 % (ref 22–35)
T4, Total: 7.4 ug/dL (ref 4.9–10.5)
TSH: 1.62 mIU/L (ref 0.40–4.50)

## 2017-09-07 LAB — PTH, INTACT AND CALCIUM
CALCIUM: 9.6 mg/dL (ref 8.6–10.3)
PTH: 85 pg/mL — ABNORMAL HIGH (ref 14–64)

## 2017-09-11 ENCOUNTER — Telehealth: Payer: Self-pay | Admitting: Internal Medicine

## 2017-09-11 DIAGNOSIS — I10 Essential (primary) hypertension: Secondary | ICD-10-CM

## 2017-09-11 DIAGNOSIS — I5042 Chronic combined systolic (congestive) and diastolic (congestive) heart failure: Secondary | ICD-10-CM

## 2017-09-11 DIAGNOSIS — M1000 Idiopathic gout, unspecified site: Secondary | ICD-10-CM

## 2017-09-11 MED ORDER — LOSARTAN POTASSIUM 50 MG PO TABS: 50.0000 mg | ORAL_TABLET | Freq: Every day | ORAL | 1 refills | Status: DC

## 2017-09-11 MED ORDER — ALLOPURINOL 300 MG PO TABS: 300.0000 mg | ORAL_TABLET | Freq: Every day | ORAL | 1 refills | Status: DC

## 2017-09-11 MED ORDER — AMLODIPINE BESYLATE 10 MG PO TABS: 10.0000 mg | ORAL_TABLET | Freq: Every day | ORAL | 1 refills | Status: DC

## 2017-09-11 NOTE — Telephone Encounter (Signed)
Copied from [place] [id number]. Topic: Quick Communication - See Telephone Encounter >> Sep 11, 2017  8:29 AM Ahmed Prima L wrote: CRM for notification. See Telephone encounter for:   09/11/17.  Patient was in the office on the 13th and told Dr Ronnald Ramp he needed refills:   allopurinol (ZYLOPRIM) 300 MG tablet losartan (COZAAR) 50 MG tablet amLODipine (NORVASC) 10 MG tablet [id number] - [place], [place] - [place]

## 2017-10-17 ENCOUNTER — Other Ambulatory Visit: Payer: Self-pay | Admitting: Internal Medicine

## 2018-01-14 ENCOUNTER — Other Ambulatory Visit: Payer: Self-pay | Admitting: Internal Medicine

## 2018-01-14 DIAGNOSIS — E785 Hyperlipidemia, unspecified: Secondary | ICD-10-CM

## 2018-01-30 ENCOUNTER — Encounter: Payer: Self-pay | Admitting: Family

## 2018-01-30 ENCOUNTER — Ambulatory Visit (INDEPENDENT_AMBULATORY_CARE_PROVIDER_SITE_OTHER): Payer: Managed Care, Other (non HMO) | Admitting: Family

## 2018-01-30 ENCOUNTER — Ambulatory Visit (INDEPENDENT_AMBULATORY_CARE_PROVIDER_SITE_OTHER)
Admission: RE | Admit: 2018-01-30 | Discharge: 2018-01-30 | Disposition: A | Discharge status: Home / Self Care | Payer: Managed Care, Other (non HMO) | Source: Ambulatory Visit | Attending: Family | Admitting: Family

## 2018-01-30 VITALS — BP 142/86 | HR 53 | Temp 98.0°F | Ht 71.0 in | Wt 239.1 lb

## 2018-01-30 DIAGNOSIS — M25511 Pain in right shoulder: Secondary | ICD-10-CM | POA: Diagnosis not present

## 2018-01-30 MED ORDER — MELOXICAM 15 MG PO TABS: 15.0000 mg | ORAL_TABLET | Freq: Every day | ORAL | 0 refills | Status: DC

## 2018-01-30 NOTE — Progress Notes (Signed)
John Hogan. is a 67 y.o. male with the following history as recorded in EpicCare:  Patient Active Problem List   Diagnosis Date Noted  . Vitamin D deficiency 09/06/2017  . CKD (chronic kidney disease) stage 3, GFR 30-59 ml/min (HCC) 08/07/2014  . Primary osteoarthritis of left shoulder 06/09/2014  . Insomnia 02/14/2014  . Erectile dysfunction 02/27/2013  . Papular atopic dermatitis 05/16/2012  . Gout 02/16/2012  . Routine general medical examination at a health care facility 02/16/2012  . Hyperlipidemia with target LDL less than 100 02/16/2012  . Aortic regurgitation 02/16/2012  . Colon polyps 02/16/2012  . Essential hypertension, benign 03/07/2009  . Hypertensive heart disease without heart failure 03/07/2009  . COMBINED HEART FAILURE, CHRONIC 03/07/2009    Current Outpatient Medications  Medication Sig Dispense Refill  . allopurinol (ZYLOPRIM) 300 MG tablet Take 1 tablet (300 mg total) by mouth daily. 90 tablet 1  . amLODipine (NORVASC) 10 MG tablet Take 1 tablet (10 mg total) by mouth daily. 90 tablet 1  . Cholecalciferol 2000 units TABS Take 1 tablet (2,000 Units total) by mouth daily. 90 tablet 1  . [facility] D3 2000 units CAPS Take 2,000 Units by mouth daily.  1  . losartan (COZAAR) 50 MG tablet Take 1 tablet (50 mg total) by mouth daily. 90 tablet 1  . pravastatin (PRAVACHOL) 20 MG tablet TAKE 1 TABLET BY MOUTH EVERY DAY 90 tablet 1  . SIMBRINZA 1-0.2 % SUSP INSTILL 1 DROP INTO BOTH EYES TWICE A DAY  3  . timolol (TIMOPTIC) 0.5 % ophthalmic solution PUT 1 DROP IN BOTH EYES TWICE A DAY  3  . Travoprost, BAK Free, (TRAVATAN) 0.004 % SOLN ophthalmic solution Place 1 drop into both eyes at bedtime.     Marland Kitchen zolpidem (AMBIEN) 5 MG tablet TAKE 1 TABLET BY MOUTH EVERY NIGHT AT BEDTIME AS NEEDED FOR SLEEP 30 tablet 3  . meloxicam (MOBIC) 15 MG tablet Take 1 tablet (15 mg total) by mouth daily. 30 tablet 0   No current facility-administered medications for this visit.     Allergies:  Patient has no known allergies.  Past Medical History:  Diagnosis Date  . AI (aortic insufficiency)   . Chronic kidney disease   . Gout   . H/O exercise stress test    Myoview 01/03/12: No scar or ischemia, EF 53%  . Hyperlipidemia   . Hypertension   . Hypertensive cardiomyopathy ([place])    EF previously 35%;  echo 12/2012 EF 55%, mild AI, ascending aorta mildly dilated, mild LAE    Past Surgical History:  Procedure Laterality Date  . COLONOSCOPY    . HERNIA REPAIR     Umbilical  . WRIST SURGERY     right    Family History  Problem Relation Age of Onset  . Hypertension Father   . Colon cancer Father 12  . Hyperlipidemia Father   . Heart disease Father   . Diabetes Father   . Stroke Mother        deceased  . Colon cancer Mother 49  . Arthritis Mother   . Hyperlipidemia Mother   . Hypertension Mother   . Diabetes Mother   . COPD Neg Hx   . Alcohol abuse Neg Hx   . Kidney disease Neg Hx   . Esophageal cancer Neg Hx   . Rectal cancer Neg Hx   . Stomach cancer Neg Hx     Social History   Tobacco Use  . Smoking status: Former  Smoker    Packs/day: 1.00    Years: 2.00    Pack years: 2.00    Types: Cigarettes    Last attempt to quit: 01/10/1975    Years since quitting: 43.0  . Smokeless tobacco: Never Used  . Tobacco comment: Only smoked two years  Substance Use Topics  . Alcohol use: No    Subjective:  Patient presents with 3 week history of right shoulder pain; no injury but notes that shoulder "popped out of joint" on its own; pain has been present since; able to get back in place; no numbness or tingling; not taking anything for symptoms.   Objective:  Vitals:   01/30/18 0934  BP: (!) 142/86  Pulse: (!) 53  Temp: 98 F (36.7 C)  TempSrc: Oral  SpO2: 98%  Weight: 239 lb 1.3 oz (108.4 kg)  Height: 5\' 11"  (1.803 m)    General: Well developed, well nourished, in no acute distress  Skin : Warm and dry.  Head: Normocephalic and atraumatic  Lungs: Respirations  unlabored; clear to auscultation bilaterally without wheeze, rales, rhonchi  Musculoskeletal: No deformities; no active joint inflammation; LROM on active and passive exam Extremities: No edema, cyanosis, clubbing  Vessels: Symmetric bilaterally  Neurologic: Alert and oriented; speech intact; face symmetrical; moves all extremities well; CNII-XII intact without focal deficit  Assessment:  1. Acute pain of right shoulder     Plan:  Update x-ray; trial of Mobic 15 mg qd; will most likely need to see orthopedist or sports medicine; follow-up to be determined.   No follow-ups on file.  Orders Placed This Encounter  Procedures  . DG Shoulder Right    Standing Status:   Future    Number of Occurrences:   1    Standing Expiration Date:   04/02/2019    Order Specific Question:   Reason for Exam (SYMPTOM  OR DIAGNOSIS REQUIRED)    Answer:   pain    Order Specific Question:   Preferred imaging location?    Answer:   [facility]    Order Specific Question:   Radiology Contrast Protocol - do NOT remove file path    Answer:   [email or web address]    Requested Prescriptions   Signed Prescriptions Disp Refills  . meloxicam (MOBIC) 15 MG tablet 30 tablet 0    Sig: Take 1 tablet (15 mg total) by mouth daily.

## 2018-02-01 ENCOUNTER — Encounter: Payer: Self-pay | Admitting: Family Medicine

## 2018-02-01 ENCOUNTER — Ambulatory Visit (INDEPENDENT_AMBULATORY_CARE_PROVIDER_SITE_OTHER): Payer: Managed Care, Other (non HMO) | Admitting: Family Medicine

## 2018-02-01 DIAGNOSIS — M25511 Pain in right shoulder: Secondary | ICD-10-CM | POA: Diagnosis not present

## 2018-02-01 NOTE — Patient Instructions (Addendum)
Nice to meet you  Please try the exercises. Try to do them at work if you're able.  Please try to ice the shoulder  Please try tylenol for the pain  Please see me back in 3-4 weeks if your pain hasn't improved

## 2018-02-01 NOTE — Progress Notes (Signed)
John Hogan. - 67 y.o. male MRN [id number]  Date of birth: February 27, 1951  SUBJECTIVE:  Including CC & ROS.  Chief Complaint  Patient presents with  . Right shoulder pain    John Hogan. is a 67 y.o. male that is presenting with right shoulder pain. Ongoing for three weeks. He remembers reaching his arm out and felt a pop. He started having pain after this. Pain is located at the lateral aspect of his acromion. Pain is mild to severe during abduction.  Denies tingling or numbness. He has been taking Mobic for the pain. Has pain when he sleeps on that side. No radicular symptoms. No prior history of similar pain. Pain has felt improved over the past three weeks.   Independent review of the right shoulder x-ray from 8/6 shows degenerative changes of the acromioclavicular joint.   Review of Systems  Constitutional: Negative for fever.  HENT: Negative for congestion.   Respiratory: Negative for cough.   Cardiovascular: Negative for chest pain.  Gastrointestinal: Negative for abdominal pain.  Musculoskeletal: Negative for gait problem.  Skin: Negative for color change.  Neurological: Negative for weakness.  Hematological: Negative for adenopathy.  Psychiatric/Behavioral: Negative for agitation.    HISTORY: Past Medical, Surgical, Social, and Family History Reviewed & Updated per EMR.   Pertinent Historical Findings include:  Past Medical History:  Diagnosis Date  . AI (aortic insufficiency)   . Chronic kidney disease   . Gout   . H/O exercise stress test    Myoview 01/03/12: No scar or ischemia, EF 53%  . Hyperlipidemia   . Hypertension   . Hypertensive cardiomyopathy ([place])    EF previously 35%;  echo 12/2012 EF 55%, mild AI, ascending aorta mildly dilated, mild LAE    Past Surgical History:  Procedure Laterality Date  . COLONOSCOPY    . HERNIA REPAIR     Umbilical  . WRIST SURGERY     right    No Known Allergies  Family History  Problem Relation Age of Onset  .  Hypertension Father   . Colon cancer Father 88  . Hyperlipidemia Father   . Heart disease Father   . Diabetes Father   . Stroke Mother        deceased  . Colon cancer Mother 80  . Arthritis Mother   . Hyperlipidemia Mother   . Hypertension Mother   . Diabetes Mother   . COPD Neg Hx   . Alcohol abuse Neg Hx   . Kidney disease Neg Hx   . Esophageal cancer Neg Hx   . Rectal cancer Neg Hx   . Stomach cancer Neg Hx      Social History   Socioeconomic History  . Marital status: Married    Spouse name: Not on file  . Number of children: 2  . Years of education: Not on file  . Highest education level: Not on file  Occupational History    Employer: [employer]  Social Needs  . Financial resource strain: Not on file  . Food insecurity:    Worry: Not on file    Inability: Not on file  . Transportation needs:    Medical: Not on file    Non-medical: Not on file  Tobacco Use  . Smoking status: Former Smoker    Packs/day: 1.00    Years: 2.00    Pack years: 2.00    Types: Cigarettes    Last attempt to quit: 01/10/1975    Years since quitting:  43.0  . Smokeless tobacco: Never Used  . Tobacco comment: Only smoked two years  Substance and Sexual Activity  . Alcohol use: No  . Drug use: No  . Sexual activity: Yes  Lifestyle  . Physical activity:    Days per week: Not on file    Minutes per session: Not on file  . Stress: Not on file  Relationships  . Social connections:    Talks on phone: Not on file    Gets together: Not on file    Attends religious service: Not on file    Active member of club or organization: Not on file    Attends meetings of clubs or organizations: Not on file    Relationship status: Not on file  . Intimate partner violence:    Fear of current or ex partner: Not on file    Emotionally abused: Not on file    Physically abused: Not on file    Forced sexual activity: Not on file  Other Topics Concern  . Not on file  Social History  Narrative   Lives at home with wife.     PHYSICAL EXAM:  VS: BP 122/68 (BP Location: Left Arm, Patient Position: Sitting, Cuff Size: Normal)   Pulse 65   Ht 5\' 11"  (1.803 m)   Wt 241 lb (109.3 kg)   SpO2 95%   BMI 33.61 kg/m  Physical Exam Gen: NAD, alert, cooperative with exam, well-appearing ENT: normal lips, normal nasal mucosa,  Eye: normal EOM, normal conjunctiva and lids CV:  no edema, +2 pedal pulses   Resp: no accessory muscle use, non-labored,  GI: no masses or tenderness, no hernia  Skin: no rashes, no areas of induration  Neuro: normal tone, normal sensation to touch Psych:  normal insight, alert and oriented MSK:  Right shoulder:  Pain with active flexion and abduction. No tenderness to palpation of the [facility] joint. Normal strength to resistance with internal and external rotation. Normal grip strength. Normal external rotation. No pain with empty can or Hawkins testing. Normal speeds test Neurovascular intact  Limited ultrasound: Right shoulder:  Unable to visualize the biceps tendon.  No Popeye deformity on clinical exam. Normal-appearing subscap.  Appears to be a subdeltoid effusion extending from the anterior aspect of the glenohumeral joint.  This is appreciated superficial to the subscap. Hypoechoic change and heterogeneity of the supraspinatus to suggest tendinopathy. AC joint with degenerative changes but no significant effusion. No effusion of the posterior glenohumeral joint.  Summary: Effusion appears to be associated with anterior aspect of glenohumeral joint.  Possibly related to arthritic change of the joint.  Ultrasound and interpretation by Clearance Coots, MD          Aspiration/Injection Procedure Note John Hogan. 09-26-1950  Procedure: Injection Indications: right shoulder pain   Procedure Details Consent: Risks of procedure as well as the alternatives and risks of each were explained to the (patient/caregiver).  Consent for  procedure obtained. Time Out: Verified patient identification, verified procedure, site/side was marked, verified correct patient position, special equipment/implants available, medications/allergies/relevent history reviewed, required imaging and test results available.  Performed.  The area was cleaned with iodine and alcohol swabs.    The right GH joint was injected using 1 cc's of 40 mg Depomedrol and 4 cc's of 0.5% bupivacaine with a 25 1 1/2" needle.  Ultrasound was used. Images were obtained in Transverse  views showing the injection.    A sterile dressing was applied.  Patient did tolerate  procedure well.         ASSESSMENT & PLAN:   Right shoulder pain Pain is likely related to the glenohumeral joint.  Effusion appears to be coming from the anterior aspect of the joint itself.  No tear of the rotator cuff but tendinopathy type changes.  Unable to visualize the biceps tendon but no Popeye deformity.  AC joint does not show any significant effusion -Glenohumeral joint injection today -Counseled on home exercise therapy -Counseled on supportive care -If no improvement would consider an AC joint injection versus a subacromial injection.  Could consider referral to physical therapy.

## 2018-02-01 NOTE — Assessment & Plan Note (Signed)
Pain is likely related to the glenohumeral joint.  Effusion appears to be coming from the anterior aspect of the joint itself.  No tear of the rotator cuff but tendinopathy type changes.  Unable to visualize the biceps tendon but no Popeye deformity.  AC joint does not show any significant effusion -Glenohumeral joint injection today -Counseled on home exercise therapy -Counseled on supportive care -If no improvement would consider an AC joint injection versus a subacromial injection.  Could consider referral to physical therapy.

## 2018-02-13 ENCOUNTER — Other Ambulatory Visit: Payer: Self-pay | Admitting: Internal Medicine

## 2018-02-25 ENCOUNTER — Other Ambulatory Visit: Payer: Self-pay | Admitting: Internal Medicine

## 2018-02-25 DIAGNOSIS — I5042 Chronic combined systolic (congestive) and diastolic (congestive) heart failure: Secondary | ICD-10-CM

## 2018-02-25 DIAGNOSIS — I10 Essential (primary) hypertension: Secondary | ICD-10-CM

## 2018-02-25 DIAGNOSIS — M1000 Idiopathic gout, unspecified site: Secondary | ICD-10-CM

## 2018-02-28 ENCOUNTER — Other Ambulatory Visit: Payer: Self-pay | Admitting: Internal Medicine

## 2018-02-28 DIAGNOSIS — M25511 Pain in right shoulder: Secondary | ICD-10-CM

## 2018-02-28 MED ORDER — MELOXICAM 15 MG PO TABS: 15.0000 mg | ORAL_TABLET | Freq: Every day | ORAL | 0 refills | Status: DC

## 2018-03-04 ENCOUNTER — Other Ambulatory Visit: Payer: Self-pay | Admitting: Internal Medicine

## 2018-03-04 DIAGNOSIS — E559 Vitamin D deficiency, unspecified: Secondary | ICD-10-CM

## 2018-03-05 ENCOUNTER — Other Ambulatory Visit: Payer: Self-pay | Admitting: Internal Medicine

## 2018-03-05 DIAGNOSIS — I10 Essential (primary) hypertension: Secondary | ICD-10-CM

## 2018-03-05 DIAGNOSIS — M1000 Idiopathic gout, unspecified site: Secondary | ICD-10-CM

## 2018-03-05 DIAGNOSIS — I5042 Chronic combined systolic (congestive) and diastolic (congestive) heart failure: Secondary | ICD-10-CM

## 2018-05-14 ENCOUNTER — Other Ambulatory Visit: Payer: Self-pay | Admitting: Internal Medicine

## 2018-05-15 ENCOUNTER — Ambulatory Visit: Payer: Managed Care, Other (non HMO) | Admitting: Family

## 2018-05-15 ENCOUNTER — Ambulatory Visit (INDEPENDENT_AMBULATORY_CARE_PROVIDER_SITE_OTHER): Payer: Managed Care, Other (non HMO) | Admitting: Family Medicine

## 2018-05-15 ENCOUNTER — Encounter: Payer: Self-pay | Admitting: Family Medicine

## 2018-05-15 ENCOUNTER — Ambulatory Visit (INDEPENDENT_AMBULATORY_CARE_PROVIDER_SITE_OTHER): Payer: Managed Care, Other (non HMO)

## 2018-05-15 VITALS — BP 130/68 | HR 88 | Ht 71.0 in | Wt 245.0 lb

## 2018-05-15 DIAGNOSIS — M75101 Unspecified rotator cuff tear or rupture of right shoulder, not specified as traumatic: Secondary | ICD-10-CM

## 2018-05-15 NOTE — Progress Notes (Signed)
John Hogan. - 67 y.o. male MRN [id number]  Date of birth: Nov 06, 1950  SUBJECTIVE:  Including CC & ROS.  Chief Complaint  Patient presents with  . Follow-up    John Hogan. is a 67 y.o. male that is here today for right shoulder pain follow up.  He received a glenohumeral joint injection on 02/01/18 with some improvement. Pain worsens when he raises his arm above his head. Pain is localized to the lateral aspect of his acromion. He has not taken anything for his pain. He has been completing home exercises provided at his last visit, he thinks it has not helped. He is left handed   Independent review of the right shoulder x-ray from 8/6 shows mild AC joint degenerative changes  Review of Systems  Constitutional: Negative for fever.  HENT: Negative for congestion.   Respiratory: Negative for cough.   Cardiovascular: Negative for chest pain.  Gastrointestinal: Negative for abdominal pain.  Musculoskeletal: Negative for gait problem.  Skin: Negative for color change.  Neurological: Negative for weakness.  Hematological: Negative for adenopathy.  Psychiatric/Behavioral: Negative for agitation.    HISTORY: Past Medical, Surgical, Social, and Family History Reviewed & Updated per EMR.   Pertinent Historical Findings include:  Past Medical History:  Diagnosis Date  . AI (aortic insufficiency)   . Chronic kidney disease   . Gout   . H/O exercise stress test    Myoview 01/03/12: No scar or ischemia, EF 53%  . Hyperlipidemia   . Hypertension   . Hypertensive cardiomyopathy ([place])    EF previously 35%;  echo 12/2012 EF 55%, mild AI, ascending aorta mildly dilated, mild LAE    Past Surgical History:  Procedure Laterality Date  . COLONOSCOPY    . HERNIA REPAIR     Umbilical  . WRIST SURGERY     right    No Known Allergies  Family History  Problem Relation Age of Onset  . Hypertension Father   . Colon cancer Father 77  . Hyperlipidemia Father   . Heart disease Father   .  Diabetes Father   . Stroke Mother        deceased  . Colon cancer Mother 44  . Arthritis Mother   . Hyperlipidemia Mother   . Hypertension Mother   . Diabetes Mother   . COPD Neg Hx   . Alcohol abuse Neg Hx   . Kidney disease Neg Hx   . Esophageal cancer Neg Hx   . Rectal cancer Neg Hx   . Stomach cancer Neg Hx      Social History   Socioeconomic History  . Marital status: Married    Spouse name: Not on file  . Number of children: 2  . Years of education: Not on file  . Highest education level: Not on file  Occupational History    Employer: [employer]  Social Needs  . Financial resource strain: Not on file  . Food insecurity:    Worry: Not on file    Inability: Not on file  . Transportation needs:    Medical: Not on file    Non-medical: Not on file  Tobacco Use  . Smoking status: Former Smoker    Packs/day: 1.00    Years: 2.00    Pack years: 2.00    Types: Cigarettes    Last attempt to quit: 01/10/1975    Years since quitting: 43.3  . Smokeless tobacco: Never Used  . Tobacco comment: Only smoked two years  Substance and Sexual Activity  . Alcohol use: No  . Drug use: No  . Sexual activity: Yes  Lifestyle  . Physical activity:    Days per week: Not on file    Minutes per session: Not on file  . Stress: Not on file  Relationships  . Social connections:    Talks on phone: Not on file    Gets together: Not on file    Attends religious service: Not on file    Active member of club or organization: Not on file    Attends meetings of clubs or organizations: Not on file    Relationship status: Not on file  . Intimate partner violence:    Fear of current or ex partner: Not on file    Emotionally abused: Not on file    Physically abused: Not on file    Forced sexual activity: Not on file  Other Topics Concern  . Not on file  Social History Narrative   Lives at home with wife.     PHYSICAL EXAM:  VS: BP 130/68   Pulse 88   Ht 5\' 11"  (1.803 m)    Wt 245 lb (111.1 kg)   SpO2 98%   BMI 34.17 kg/m  Physical Exam Gen: NAD, alert, cooperative with exam, well-appearing ENT: normal lips, normal nasal mucosa,  Eye: normal EOM, normal conjunctiva and lids CV:  no edema, +2 pedal pulses   Resp: no accessory muscle use, non-labored,  Skin: no rashes, no areas of induration  Neuro: normal tone, normal sensation to touch Psych:  normal insight, alert and oriented MSK:  Right Shoulder: Inspection reveals no abnormalities, atrophy or asymmetry. Palpation is normal with no tenderness over AC joint  ROM is full in all planes. Rotator cuff strength normal throughout Pain with Hawkin's tests, empty can sign. Neurovascularly intact    Aspiration/Injection Procedure Note Austin Herd. [birth year]  Procedure: Injection Indications: right shoulder pain   Procedure Details Consent: Risks of procedure as well as the alternatives and risks of each were explained to the (patient/caregiver).  Consent for procedure obtained. Time Out: Verified patient identification, verified procedure, site/side was marked, verified correct patient position, special equipment/implants available, medications/allergies/relevent history reviewed, required imaging and test results available.  Performed.  The area was cleaned with iodine and alcohol swabs.    The right subacromial space was injected using 1 cc's of 40 mg Depomedrol and 4 cc's of 0.25% lidocaine with a 25 1 1/2" needle.  Ultrasound was used. Images were obtained in  Long views showing the injection.    A sterile dressing was applied.  Patient did tolerate procedure well.        ASSESSMENT & PLAN:   Rotator cuff syndrome Mild improvement with GH injection. Pain likely component related to impingement.  - subacromial injection  - counseled on exercise and supportive care - if no improvement consider PT vs imaging.

## 2018-05-15 NOTE — Assessment & Plan Note (Signed)
Mild improvement with GH injection. Pain likely component related to impingement.  - subacromial injection  - counseled on exercise and supportive care - if no improvement consider PT vs imaging.

## 2018-05-15 NOTE — Patient Instructions (Signed)
Good to see you  Please continue the exercises  Please try ice on the shoulder  Please see me back in 4-6 weeks if your pain hasn't improved.

## 2018-05-22 ENCOUNTER — Other Ambulatory Visit: Payer: Self-pay | Admitting: Internal Medicine

## 2018-05-22 DIAGNOSIS — M25511 Pain in right shoulder: Secondary | ICD-10-CM

## 2018-05-28 ENCOUNTER — Encounter: Payer: Self-pay | Admitting: Internal Medicine

## 2018-05-30 ENCOUNTER — Ambulatory Visit: Payer: Managed Care, Other (non HMO) | Admitting: Internal Medicine

## 2018-06-16 ENCOUNTER — Other Ambulatory Visit: Payer: Self-pay | Admitting: Internal Medicine

## 2018-06-16 DIAGNOSIS — R21 Rash and other nonspecific skin eruption: Secondary | ICD-10-CM

## 2018-08-08 ENCOUNTER — Other Ambulatory Visit: Payer: Self-pay

## 2018-08-14 ENCOUNTER — Encounter: Payer: Self-pay | Admitting: Internal Medicine

## 2018-08-20 ENCOUNTER — Encounter: Payer: Self-pay | Admitting: Internal Medicine

## 2018-08-23 NOTE — Progress Notes (Signed)
HPI The patient presents for followup of aortic insufficiency. He has had previously reduced ejection fraction but it has been improved at followup. The last echo was done in Feb 2018 and had a preserved EF and mild AI.  Since I last saw him he has done well. The patient denies any new symptoms such as chest discomfort, neck or arm discomfort. There has been no new shortness of breath, PND or orthopnea. There have been no reported palpitations, presyncope or syncope.  He works.  But not a particular active job.  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  No Known Allergies  Current Outpatient Medications  Medication Sig Dispense Refill  . allopurinol (ZYLOPRIM) 300 MG tablet TAKE 1 TABLET BY MOUTH EVERY DAY 90 tablet 1  . amLODipine (NORVASC) 10 MG tablet TAKE 1 TABLET BY MOUTH EVERY DAY 90 tablet 1  . [facility] D3 2000 units CAPS TAKE 1 CAPSULE BY MOUTH EVERY DAY 90 capsule 1  . losartan (COZAAR) 50 MG tablet TAKE 1 TABLET BY MOUTH EVERY DAY 90 tablet 1  . pravastatin (PRAVACHOL) 20 MG tablet TAKE 1 TABLET BY MOUTH EVERY DAY 90 tablet 1  . SIMBRINZA 1-0.2 % SUSP INSTILL 1 DROP INTO BOTH EYES TWICE A DAY  3  . timolol (TIMOPTIC) 0.5 % ophthalmic solution PUT 1 DROP IN BOTH EYES TWICE A DAY  3  . Travoprost, BAK Free, (TRAVATAN) 0.004 % SOLN ophthalmic solution Place 1 drop into both eyes at bedtime.     . triamcinolone cream (KENALOG) 0.1 % APPLY TO AFFECTED AREA TWICE A DAY 30 g 0  . zolpidem (AMBIEN) 5 MG tablet TAKE 1 TABLET BY MOUTH AT BEDTIME AS NEEDED FOR SLEEP 30 tablet 3   No current facility-administered medications for this visit.     Past Medical History:  Diagnosis Date  . AI (aortic insufficiency)   . Chronic kidney disease   . Gout   . H/O exercise stress test    Myoview 01/03/12: No scar or ischemia, EF 53%  . Hyperlipidemia   . Hypertension   . Hypertensive cardiomyopathy ([place])    EF previously 35%;  echo  12/2012 EF 55%, mild AI, ascending aorta mildly dilated, mild LAE    Past Surgical History:  Procedure Laterality Date  . COLONOSCOPY    . HERNIA REPAIR     Umbilical  . WRIST SURGERY     right    ROS:  As stated in the HPI and negative for all other systems.  PHYSICAL EXAM BP 138/72   Pulse (!) 45   Ht 5\' 11"  (1.803 m)   Wt 238 lb (108 kg)   BMI 33.19 kg/m   GENERAL:  Well appearing NECK:  No jugular venous distention, waveform within normal limits, carotid upstroke brisk and symmetric, no bruits, no thyromegaly LUNGS:  Clear to auscultation bilaterally CHEST:  Unremarkable HEART:  PMI not displaced or sustained,S1 and S2 within normal limits, no S3, no S4, no clicks, no rubs, 3 out of 6 diastolic murmur heard best at the third left intercostal space and lasting long into diastole, no systolic murmurs ABD:  Flat, positive bowel sounds normal in frequency in pitch, no bruits, no rebound, no guarding, no midline pulsatile mass, no hepatomegaly, no splenomegaly EXT:  2 plus pulses throughout, no edema, no cyanosis no clubbing     EKG:    Sinus bradycardia, rate 45,  left axis , intervals within normal limits, no acute ST T wave changes. 08/24/2018  Lab Results  Component Value Date   CHOL 166 09/06/2017   TRIG 44.0 09/06/2017   HDL 67.70 09/06/2017   LDLCALC 89 09/06/2017   LDLDIRECT 140.7 02/27/2013   Lab Results  Component Value Date   CREATININE 1.59 (H) 09/06/2017   CREATININE 1.93 (H) 03/09/2017   CREATININE 1.31 07/07/2016     ASSESSMENT AND PLAN   Aortic insufficiency - This was mild on echo in 2018.  By exam the murmur last long into diastole suggesting it is not severe.  He has no symptoms.  Do not think further imaging is indicated at this point although I will probably repeat an echo next year.   HTN - The blood pressure is at target.  No change in therapy.  He should lose a little weight and we did discuss this.   Dyslipidemia -  At a previous visit  I increased Pravachol.  Lipids were excellent in March and he is due to have follow-up soon.  CKD -   I note that his creatinine was increased to 1.59.  He has follow-up of this as mentioned above.  This was actually lower than the year before.  BRADYCARDIA -  He tolerates a low heart rhythm.  He has no presyncope or syncope.  No change in therapy.

## 2018-08-24 ENCOUNTER — Encounter: Payer: Self-pay | Admitting: Cardiology

## 2018-08-24 ENCOUNTER — Telehealth: Payer: Self-pay | Admitting: Internal Medicine

## 2018-08-24 ENCOUNTER — Ambulatory Visit (INDEPENDENT_AMBULATORY_CARE_PROVIDER_SITE_OTHER): Payer: Managed Care, Other (non HMO) | Admitting: Cardiology

## 2018-08-24 VITALS — BP 138/72 | HR 45 | Ht 71.0 in | Wt 238.0 lb

## 2018-08-24 DIAGNOSIS — I351 Nonrheumatic aortic (valve) insufficiency: Secondary | ICD-10-CM | POA: Diagnosis not present

## 2018-08-24 DIAGNOSIS — E785 Hyperlipidemia, unspecified: Secondary | ICD-10-CM

## 2018-08-24 DIAGNOSIS — N183 Chronic kidney disease, stage 3 unspecified: Secondary | ICD-10-CM

## 2018-08-24 DIAGNOSIS — I10 Essential (primary) hypertension: Secondary | ICD-10-CM

## 2018-08-24 NOTE — Telephone Encounter (Signed)
Copied from [place] [id number]. Topic: Quick Communication - Rx Refill/Question >> Aug 24, 2018  4:52 PM Blase Mess A wrote: Medication: atorvastatin (LIPITOR) 40 MG tablet [[id number]]  DISCONTINUED Apt schedule 09/12/18  Has the patient contacted their pharmacy? Yes  (Agent: If no, request that the patient contact the pharmacy for the refill.) (Agent: If yes, when and what did the pharmacy advise?)  Preferred Pharmacy (with phone number or street name): [id number] - , [place] [phone] (Phone) [phone] (Fax)    Agent: Please be advised that RX refills may take up to 3 business days. We ask that you follow-up with your pharmacy.

## 2018-08-24 NOTE — Patient Instructions (Signed)
Medication Instructions:  Continue current medications  If you need a refill on your cardiac medications before your next appointment, please call your pharmacy.  Labwork: None Ordered   Testing/Procedures: None Ordered  Follow-Up: You will need a follow up appointment in 1 Year.  Please call our office 2 months in advance to schedule this appointment.  You may see Dr Hochrein or one of the following Advanced Practice Providers on your designated Care Team:   Rhonda Barrett, PA-C . Kathryn Lawrence, DNP, ANP   At [facility], you and your health needs are our priority.  As part of our continuing mission to provide you with exceptional heart care, we have created designated Provider Care Teams.  These Care Teams include your primary Cardiologist (physician) and Advanced Practice Providers (APPs -  Physician Assistants and Nurse Practitioners) who all work together to provide you with the care you need, when you need it.  Thank you for choosing [facility] at [facility]!!     

## 2018-08-27 MED ORDER — PRAVASTATIN SODIUM 20 MG PO TABS: 20.0000 mg | ORAL_TABLET | Freq: Every day | ORAL | 0 refills | Status: DC

## 2018-08-27 NOTE — Telephone Encounter (Signed)
Called pt to confirm which rx he needed. Informed pt that we did not have the atorvastatin. Pt stated he could not remember the name of it and confirmed pravastatin after I stated the name.   [facility] [address]

## 2018-08-27 NOTE — Telephone Encounter (Signed)
Atorvastatin is not on pt medication list.

## 2018-09-01 ENCOUNTER — Other Ambulatory Visit: Payer: Self-pay | Admitting: Internal Medicine

## 2018-09-01 DIAGNOSIS — I5042 Chronic combined systolic (congestive) and diastolic (congestive) heart failure: Secondary | ICD-10-CM

## 2018-09-01 DIAGNOSIS — M1000 Idiopathic gout, unspecified site: Secondary | ICD-10-CM

## 2018-09-01 DIAGNOSIS — I10 Essential (primary) hypertension: Secondary | ICD-10-CM

## 2018-09-02 ENCOUNTER — Other Ambulatory Visit: Payer: Self-pay | Admitting: Internal Medicine

## 2018-09-02 DIAGNOSIS — E559 Vitamin D deficiency, unspecified: Secondary | ICD-10-CM

## 2018-09-03 ENCOUNTER — Other Ambulatory Visit: Payer: Self-pay

## 2018-09-03 ENCOUNTER — Encounter: Payer: Self-pay | Admitting: Internal Medicine

## 2018-09-03 ENCOUNTER — Ambulatory Visit (AMBULATORY_SURGERY_CENTER): Payer: Self-pay | Admitting: *Deleted

## 2018-09-03 VITALS — Ht 71.0 in | Wt 237.0 lb

## 2018-09-03 DIAGNOSIS — Z800 Family history of malignant neoplasm of digestive organs: Secondary | ICD-10-CM

## 2018-09-03 DIAGNOSIS — Z860100 Personal history of colon polyps, unspecified: Secondary | ICD-10-CM

## 2018-09-03 MED ORDER — PEG 3350-KCL-NA BICARB-NACL 420 G PO SOLR: 4000.0000 mL | Freq: Once | ORAL | 0 refills | Status: AC

## 2018-09-03 NOTE — Progress Notes (Signed)
No egg or soy allergy known to patient  No issues with past sedation with any surgeries  or procedures, no intubation problems  No diet pills per patient No home 02 use per patient  No blood thinners per patient  Pt denies issues with constipation  No A fib or A flutter  EMMI video sent to pt's e mail - declined

## 2018-09-12 ENCOUNTER — Encounter: Payer: Self-pay | Admitting: Internal Medicine

## 2018-09-12 ENCOUNTER — Ambulatory Visit (INDEPENDENT_AMBULATORY_CARE_PROVIDER_SITE_OTHER): Payer: Managed Care, Other (non HMO) | Admitting: Internal Medicine

## 2018-09-12 ENCOUNTER — Other Ambulatory Visit: Payer: Self-pay

## 2018-09-12 ENCOUNTER — Other Ambulatory Visit (INDEPENDENT_AMBULATORY_CARE_PROVIDER_SITE_OTHER): Payer: Managed Care, Other (non HMO)

## 2018-09-12 VITALS — BP 164/82 | HR 57 | Temp 98.0°F | Ht 71.0 in | Wt 235.0 lb

## 2018-09-12 DIAGNOSIS — I5042 Chronic combined systolic (congestive) and diastolic (congestive) heart failure: Secondary | ICD-10-CM

## 2018-09-12 DIAGNOSIS — E785 Hyperlipidemia, unspecified: Secondary | ICD-10-CM

## 2018-09-12 DIAGNOSIS — N183 Chronic kidney disease, stage 3 unspecified: Secondary | ICD-10-CM

## 2018-09-12 DIAGNOSIS — Z125 Encounter for screening for malignant neoplasm of prostate: Secondary | ICD-10-CM

## 2018-09-12 DIAGNOSIS — N522 Drug-induced erectile dysfunction: Secondary | ICD-10-CM

## 2018-09-12 DIAGNOSIS — M1000 Idiopathic gout, unspecified site: Secondary | ICD-10-CM

## 2018-09-12 DIAGNOSIS — I10 Essential (primary) hypertension: Secondary | ICD-10-CM | POA: Diagnosis not present

## 2018-09-12 DIAGNOSIS — E559 Vitamin D deficiency, unspecified: Secondary | ICD-10-CM

## 2018-09-12 DIAGNOSIS — Z23 Encounter for immunization: Secondary | ICD-10-CM

## 2018-09-12 DIAGNOSIS — Z0000 Encounter for general adult medical examination without abnormal findings: Secondary | ICD-10-CM | POA: Diagnosis not present

## 2018-09-12 LAB — LIPID PANEL
CHOL/HDL RATIO: 3
Cholesterol: 169 mg/dL (ref 0–200)
HDL: 66.1 mg/dL (ref >39.00)
LDL Cholesterol: 89 mg/dL (ref 0–99)
NonHDL: 102.73
Triglycerides: 68 mg/dL (ref 0.0–149.0)
VLDL: 13.6 mg/dL (ref 0.0–40.0)

## 2018-09-12 LAB — URINALYSIS, ROUTINE W REFLEX MICROSCOPIC
Bilirubin Urine: NEGATIVE
Hgb urine dipstick: NEGATIVE
Ketones, ur: NEGATIVE
Leukocytes,Ua: NEGATIVE
Nitrite: NEGATIVE
Specific Gravity, Urine: 1.025 (ref 1.000–1.030)
Total Protein, Urine: NEGATIVE
Urine Glucose: NEGATIVE
Urobilinogen, UA: 1 (ref 0.0–1.0)
pH: 5.5 (ref 5.0–8.0)

## 2018-09-12 LAB — COMPREHENSIVE METABOLIC PANEL
ALT: 12 U/L (ref 0–53)
AST: 13 U/L (ref 0–37)
Albumin: 4.5 g/dL (ref 3.5–5.2)
Alkaline Phosphatase: 80 U/L (ref 39–117)
BUN: 24 mg/dL — ABNORMAL HIGH (ref 6–23)
CO2: 29 mEq/L (ref 19–32)
Calcium: 9.6 mg/dL (ref 8.4–10.5)
Chloride: 105 mEq/L (ref 96–112)
Creatinine, Ser: 1.66 mg/dL — ABNORMAL HIGH (ref 0.40–1.50)
GFR: 49.96 mL/min — ABNORMAL LOW (ref >60.00)
Glucose, Bld: 104 mg/dL — ABNORMAL HIGH (ref 70–99)
POTASSIUM: 4.3 meq/L (ref 3.5–5.1)
Sodium: 140 mEq/L (ref 135–145)
Total Bilirubin: 0.9 mg/dL (ref 0.2–1.2)
Total Protein: 7.9 g/dL (ref 6.0–8.3)

## 2018-09-12 LAB — CBC WITH DIFFERENTIAL/PLATELET
BASOS PCT: 1.5 % (ref 0.0–3.0)
Basophils Absolute: 0.1 10*3/uL (ref 0.0–0.1)
EOS ABS: 0.2 10*3/uL (ref 0.0–0.7)
Eosinophils Relative: 5.9 % — ABNORMAL HIGH (ref 0.0–5.0)
HEMATOCRIT: 42.8 % (ref 39.0–52.0)
HEMOGLOBIN: 14.4 g/dL (ref 13.0–17.0)
Lymphocytes Relative: 31.5 % (ref 12.0–46.0)
Lymphs Abs: 1.2 10*3/uL (ref 0.7–4.0)
MCHC: 33.6 g/dL (ref 30.0–36.0)
MCV: 93.8 fl (ref 78.0–100.0)
Monocytes Absolute: 0.3 10*3/uL (ref 0.1–1.0)
Monocytes Relative: 7.6 % (ref 3.0–12.0)
Neutro Abs: 2 10*3/uL (ref 1.4–7.7)
Neutrophils Relative %: 53.5 % (ref 43.0–77.0)
Platelets: 205 10*3/uL (ref 150.0–400.0)
RBC: 4.56 Mil/uL (ref 4.22–5.81)
RDW: 13.7 % (ref 11.5–15.5)
WBC: 3.8 10*3/uL — ABNORMAL LOW (ref 4.0–10.5)

## 2018-09-12 LAB — VITAMIN D 25 HYDROXY (VIT D DEFICIENCY, FRACTURES): VITD: 48.87 ng/mL (ref 30.00–100.00)

## 2018-09-12 LAB — TSH: TSH: 1.7 u[IU]/mL (ref 0.35–4.50)

## 2018-09-12 LAB — PSA: PSA: 2.04 ng/mL (ref 0.10–4.00)

## 2018-09-12 LAB — URIC ACID: Uric Acid, Serum: 4.9 mg/dL (ref 4.0–7.8)

## 2018-09-12 MED ORDER — HYDROCHLOROTHIAZIDE 12.5 MG PO CAPS: 12.5000 mg | ORAL_CAPSULE | Freq: Every day | ORAL | 0 refills | Status: DC

## 2018-09-12 MED ORDER — SILDENAFIL CITRATE 20 MG PO TABS: 80.0000 mg | ORAL_TABLET | Freq: Every day | ORAL | 5 refills | Status: DC | PRN

## 2018-09-12 NOTE — Patient Instructions (Signed)

## 2018-09-12 NOTE — Progress Notes (Signed)
Subjective:  Patient ID: John Hogan., male    DOB: [birth year]  Age: 68 y.o. MRN: [id number]  CC: Annual Exam and Hypertension   HPI John Hogan. presents for a CPX.  He does not monitor his blood pressure.  He tells me he is compliant with amlodipine and losartan.  He denies any recent episodes of CP, DOE, palpitations, edema, or fatigue.  Outpatient Medications Prior to Visit  Medication Sig Dispense Refill  . amLODipine (NORVASC) 10 MG tablet TAKE 1 TABLET BY MOUTH EVERY DAY 90 tablet 1  . [facility] D3 50 MCG (2000 UT) CAPS TAKE 1 CAPSULE BY MOUTH EVERY DAY 90 capsule 1  . losartan (COZAAR) 50 MG tablet TAKE 1 TABLET BY MOUTH EVERY DAY 90 tablet 1  . pravastatin (PRAVACHOL) 20 MG tablet Take 1 tablet (20 mg total) by mouth daily. 30 tablet 0  . SIMBRINZA 1-0.2 % SUSP INSTILL 1 DROP INTO BOTH EYES TWICE A DAY  3  . timolol (TIMOPTIC) 0.5 % ophthalmic solution PUT 1 DROP IN BOTH EYES TWICE A DAY  3  . Travoprost, BAK Free, (TRAVATAN) 0.004 % SOLN ophthalmic solution Place 1 drop into both eyes at bedtime.     . triamcinolone cream (KENALOG) 0.1 % APPLY TO AFFECTED AREA TWICE A DAY 30 g 0  . zolpidem (AMBIEN) 5 MG tablet TAKE 1 TABLET BY MOUTH AT BEDTIME AS NEEDED FOR SLEEP 30 tablet 3  . allopurinol (ZYLOPRIM) 300 MG tablet TAKE 1 TABLET BY MOUTH EVERY DAY 90 tablet 1   No facility-administered medications prior to visit.     ROS Review of Systems  Constitutional: Negative.  Negative for diaphoresis, fatigue and unexpected weight change.  HENT: Negative.  Negative for trouble swallowing.   Eyes: Negative for visual disturbance.  Respiratory: Negative for cough, chest tightness, shortness of breath and wheezing.   Cardiovascular: Negative for chest pain, palpitations and leg swelling.  Gastrointestinal: Negative for abdominal pain, constipation, diarrhea, nausea and vomiting.  Endocrine: Negative.   Genitourinary: Negative.  Negative for difficulty urinating, dysuria,  frequency, penile swelling, scrotal swelling, testicular pain and urgency.       + ED  Musculoskeletal: Negative.  Negative for arthralgias, back pain, myalgias and neck pain.  Skin: Negative.  Negative for color change and pallor.  Neurological: Negative.  Negative for dizziness, weakness, light-headedness and headaches.  Hematological: Negative for adenopathy. Does not bruise/bleed easily.  Psychiatric/Behavioral: Negative.     Objective:  BP (!) 164/82 (BP Location: Left Arm, Patient Position: Sitting, Cuff Size: Large)   Pulse (!) 57   Temp 98 F (36.7 C) (Oral)   Ht 5\' 11"  (1.803 m)   Wt 235 lb (106.6 kg)   SpO2 92%   BMI 32.78 kg/m   BP Readings from Last 3 Encounters:  09/12/18 (!) 164/82  08/24/18 138/72  05/15/18 130/68    Wt Readings from Last 3 Encounters:  09/12/18 235 lb (106.6 kg)  09/03/18 237 lb (107.5 kg)  08/24/18 238 lb (108 kg)    Physical Exam Vitals signs reviewed.  Constitutional:      Appearance: He is obese. He is not ill-appearing or diaphoretic.  HENT:     Nose: Nose normal. No congestion or rhinorrhea.     Mouth/Throat:     Mouth: Mucous membranes are moist.     Pharynx: Oropharynx is clear. No oropharyngeal exudate or posterior oropharyngeal erythema.  Eyes:     General: No scleral icterus.    Conjunctiva/sclera: Conjunctivae  normal.  Neck:     Musculoskeletal: Normal range of motion. No muscular tenderness.  Cardiovascular:     Rate and Rhythm: Normal rate and regular rhythm.     Heart sounds: No murmur. No gallop.   Pulmonary:     Effort: Pulmonary effort is normal.     Breath sounds: No stridor. No wheezing, rhonchi or rales.  Abdominal:     General: Bowel sounds are normal.     Palpations: There is no hepatomegaly, splenomegaly or mass.     Tenderness: There is no abdominal tenderness.     Hernia: There is no hernia in the right inguinal area or left inguinal area.  Genitourinary:    Pubic Area: No rash.      Penis:  Uncircumcised. No phimosis, paraphimosis, hypospadias, erythema, tenderness, discharge, swelling or lesions.      Scrotum/Testes: Normal.        Right: Mass not present.        Left: Mass not present.     Epididymis:     Right: Normal.     Left: Normal.  Musculoskeletal: Normal range of motion.        General: No swelling.     Right lower leg: No edema.     Left lower leg: No edema.  Lymphadenopathy:     Cervical: No cervical adenopathy.     Lower Body: No right inguinal adenopathy. No left inguinal adenopathy.  Skin:    General: Skin is warm and dry.     Coloration: Skin is not pale.     Findings: No erythema or rash.  Neurological:     General: No focal deficit present.     Mental Status: He is oriented to person, place, and time. Mental status is at baseline.     Lab Results  Component Value Date   WBC 3.8 (L) 09/12/2018   HGB 14.4 09/12/2018   HCT 42.8 09/12/2018   PLT 205.0 09/12/2018   GLUCOSE 104 (H) 09/12/2018   CHOL 169 09/12/2018   TRIG 68.0 09/12/2018   HDL 66.10 09/12/2018   LDLDIRECT 140.7 02/27/2013   LDLCALC 89 09/12/2018   ALT 12 09/12/2018   AST 13 09/12/2018   NA 140 09/12/2018   K 4.3 09/12/2018   CL 105 09/12/2018   CREATININE 1.66 (H) 09/12/2018   BUN 24 (H) 09/12/2018   CO2 29 09/12/2018   TSH 1.70 09/12/2018   PSA 2.04 09/12/2018    Dg Shoulder Right  Result Date: 01/30/2018 CLINICAL DATA:  68 year old male with generalized shoulder pain for 3 weeks. Heard pop. Initial encounter. EXAM: RIGHT SHOULDER - 2+ VIEW COMPARISON:  None. FINDINGS: Mild to moderate right acromioclavicular joint degenerative changes. Spur superolateral aspect humeral head probably related to impingement against the undersurface of the acromion with motion. No fracture or dislocation. Visualized lungs clear. IMPRESSION: 1. Mild to moderate right acromioclavicular joint degenerative changes. 2. Spur superolateral aspect humeral head probably related to impingement against  the undersurface of the acromion with motion. Electronically Signed   By: Genia Del M.D.   On: 01/30/2018 11:18    Assessment & Plan:   John Hogan was seen today for annual exam and hypertension.  Diagnoses and all orders for this visit:  CKD (chronic kidney disease) stage 3, GFR 30-59 ml/min (HCC) - His renal function is stable.  He will avoid nephrotoxic agents.  I will try to get better control of his blood pressure. -     CBC with Differential/Platelet; Future -  Comprehensive metabolic panel; Future -     Urinalysis, Routine w reflex microscopic; Future -     Uric acid; Future  Hyperlipidemia with target LDL less than 100- He has achieved his LDL goal and is doing well on the statin. -     TSH; Future  Routine general medical examination at a health care facility- Exam completed, labs reviewed, vaccines reviewed, he has a colonoscopy coming up next week, patient education material was given. -     Lipid panel; Future -     PSA; Future  Vitamin D deficiency -     VITAMIN D 25 Hydroxy (Vit-D Deficiency, Fractures); Future  COMBINED HEART FAILURE, CHRONIC- He has a normal volume status and is asymptomatic.  Try to get better control of his blood pressure.  Essential hypertension, benign- His blood pressure is not adequately well controlled.  I have asked him to add hydrochlorothiazide to the amlodipine and losartan. -     CBC with Differential/Platelet; Future -     Comprehensive metabolic panel; Future -     hydrochlorothiazide (MICROZIDE) 12.5 MG capsule; Take 1 capsule (12.5 mg total) by mouth daily.  Need for pneumococcal vaccination -     Pneumococcal polysaccharide vaccine 23-valent greater than or equal to 2yo subcutaneous/IM  Drug-induced erectile dysfunction -     sildenafil (REVATIO) 20 MG tablet; Take 4 tablets (80 mg total) by mouth daily as needed.  Idiopathic gout, unspecified chronicity, unspecified site -     allopurinol (ZYLOPRIM) 300 MG tablet; Take 1  tablet (300 mg total) by mouth daily.   I have changed Donzetta Starch Jr.'s allopurinol. I am also having him start on hydrochlorothiazide and sildenafil. Additionally, I am having him maintain his Travoprost (BAK Free), timolol, Simbrinza, losartan, amLODipine, zolpidem, triamcinolone cream, pravastatin, and [facility]S D3.  Meds ordered this encounter  Medications  . hydrochlorothiazide (MICROZIDE) 12.5 MG capsule    Sig: Take 1 capsule (12.5 mg total) by mouth daily.    Dispense:  90 capsule    Refill:  0  . sildenafil (REVATIO) 20 MG tablet    Sig: Take 4 tablets (80 mg total) by mouth daily as needed.    Dispense:  60 tablet    Refill:  5  . allopurinol (ZYLOPRIM) 300 MG tablet    Sig: Take 1 tablet (300 mg total) by mouth daily.    Dispense:  90 tablet    Refill:  1     Follow-up: Return in about 3 months (around 12/13/2018).  Scarlette Calico, MD

## 2018-09-13 ENCOUNTER — Telehealth: Payer: Self-pay | Admitting: Internal Medicine

## 2018-09-13 MED ORDER — ALLOPURINOL 300 MG PO TABS: 300.0000 mg | ORAL_TABLET | Freq: Every day | ORAL | 1 refills | Status: DC

## 2018-09-13 NOTE — Telephone Encounter (Signed)
rf rx for allopurinol (ZYLOPRIM) 300 MG tablet [facility] [address]

## 2018-09-13 NOTE — Telephone Encounter (Signed)
Copied from [place] [id number]. Topic: Quick Communication - Rx Refill/Question >> Sep 13, 2018  9:35 AM Leward Quan A wrote: Medication: allopurinol (ZYLOPRIM) 300 MG tablet   Has the patient contacted their pharmacy? Yes.   (Agent: If no, request that the patient contact the pharmacy for the refill.) (Agent: If yes, when and what did the pharmacy advise?)  Preferred Pharmacy (with phone number or street name): [id number] [place], [place] [phone] (Phone) [phone] (Fax)    Agent: Please be advised that RX refills may take up to 3 business days. We ask that you follow-up with your pharmacy.

## 2018-09-14 ENCOUNTER — Telehealth: Payer: Self-pay

## 2018-09-14 NOTE — Telephone Encounter (Signed)
Pt return call °

## 2018-09-14 NOTE — Telephone Encounter (Signed)
Covid-19 travel screening questions  Have you traveled in the last 14 days?  If yes where?  Do you now or have you had a fever in the last 14 days?  Do you have any respiratory symptoms of shortness of breath or cough now or in the last 14 days?  Do you have a medical history of Congestive Heart Failure?  Do you have a medical history of lung disease?  Do you have any family members or close contacts with diagnosed or suspected Covid-19?     Left message at 8:15 for patient to call us back regarding our screening survey.

## 2018-09-14 NOTE — Telephone Encounter (Signed)
I have spoken to patient to advise that unfortunately,  has requested that all non-emergent or non life threatening cases be rescheduled at this time due to COVID-19 pandemic.   At this time, colonoscopy scheduled for 09/17/18 has been cancelled. I advised patient that we will be in contact with him at a later date to reschedule once a new schedule becomes available. He verbalizes understanding.

## 2018-09-17 ENCOUNTER — Encounter: Payer: Managed Care, Other (non HMO) | Admitting: Internal Medicine

## 2018-09-18 ENCOUNTER — Other Ambulatory Visit: Payer: Self-pay | Admitting: Internal Medicine

## 2018-09-19 ENCOUNTER — Other Ambulatory Visit: Payer: Self-pay | Admitting: Internal Medicine

## 2018-09-19 DIAGNOSIS — E785 Hyperlipidemia, unspecified: Secondary | ICD-10-CM

## 2018-10-03 ENCOUNTER — Other Ambulatory Visit: Payer: Self-pay | Admitting: Internal Medicine

## 2018-10-03 DIAGNOSIS — I10 Essential (primary) hypertension: Secondary | ICD-10-CM

## 2018-10-11 ENCOUNTER — Other Ambulatory Visit: Payer: Self-pay | Admitting: Internal Medicine

## 2018-10-11 DIAGNOSIS — N522 Drug-induced erectile dysfunction: Secondary | ICD-10-CM

## 2018-10-16 ENCOUNTER — Other Ambulatory Visit: Payer: Self-pay | Admitting: Internal Medicine

## 2018-10-16 DIAGNOSIS — R21 Rash and other nonspecific skin eruption: Secondary | ICD-10-CM

## 2018-10-22 ENCOUNTER — Telehealth: Payer: Self-pay

## 2018-10-22 NOTE — Telephone Encounter (Signed)
PA completed.   Received fax stating that PA was not approved.

## 2018-10-22 NOTE — Telephone Encounter (Signed)
Pt informed PA was approved.

## 2018-10-23 ENCOUNTER — Telehealth: Payer: Self-pay | Admitting: *Deleted

## 2018-10-23 NOTE — Telephone Encounter (Signed)
Pt rescheduled for colonoscopy previously cancelled due to Covid-19. Instructions redone and mailed to patient.

## 2018-10-29 ENCOUNTER — Other Ambulatory Visit: Payer: Self-pay | Admitting: Internal Medicine

## 2018-10-29 DIAGNOSIS — I5042 Chronic combined systolic (congestive) and diastolic (congestive) heart failure: Secondary | ICD-10-CM

## 2018-10-29 DIAGNOSIS — I10 Essential (primary) hypertension: Secondary | ICD-10-CM

## 2018-11-02 ENCOUNTER — Telehealth: Payer: Self-pay | Admitting: *Deleted

## 2018-11-02 NOTE — Telephone Encounter (Signed)
Covid-19 travel screening questions  Have you traveled in the last 14 days? no If yes where?  Do you now or have you had a fever in the last 14 days? no  Do you have any respiratory symptoms of shortness of breath or cough now or in the last 14 days? no  Do you have any family members or close contacts with diagnosed or suspected Covid-19? no  Pt is aware that care partner will be staying in car during procedure.  He will wear a mask coming into the building

## 2018-11-02 NOTE — Telephone Encounter (Signed)
LMOM to confirm arrival time for appointment Monday and to go over screening questions.  Will call pt back.

## 2018-11-02 NOTE — Telephone Encounter (Signed)
Pt is returning call best call back [phone] states he is available to speak with nurse rest of the day.

## 2018-11-05 ENCOUNTER — Encounter: Payer: Self-pay | Admitting: Internal Medicine

## 2018-11-05 ENCOUNTER — Other Ambulatory Visit: Payer: Self-pay

## 2018-11-05 ENCOUNTER — Ambulatory Visit (AMBULATORY_SURGERY_CENTER): Payer: Managed Care, Other (non HMO) | Admitting: Internal Medicine

## 2018-11-05 VITALS — BP 112/63 | HR 40 | Temp 98.7°F | Resp 20 | Ht 71.0 in | Wt 237.0 lb

## 2018-11-05 DIAGNOSIS — D124 Benign neoplasm of descending colon: Secondary | ICD-10-CM | POA: Diagnosis not present

## 2018-11-05 DIAGNOSIS — D123 Benign neoplasm of transverse colon: Secondary | ICD-10-CM

## 2018-11-05 DIAGNOSIS — K5140 Inflammatory polyps of colon without complications: Secondary | ICD-10-CM

## 2018-11-05 DIAGNOSIS — Z860100 Personal history of colon polyps, unspecified: Secondary | ICD-10-CM

## 2018-11-05 LAB — HM COLONOSCOPY

## 2018-11-05 MED ORDER — SODIUM CHLORIDE 0.9 % IV SOLN: 500.0000 mL | Freq: Once | INTRAVENOUS | Status: DC

## 2018-11-05 NOTE — Op Note (Signed)
[facility] Patient Name: John Hogan Procedure Date: 11/05/2018 8:50 AM MRN: [id number] Endoscopist: Jerene Bears , MD Age: 68 Referring MD:  Date of Birth: Aug 08, 1950 Gender: Male Account #: [id number] Procedure:                Colonoscopy Indications:              Surveillance: History of numerous (> 10) adenomas                            on last colonoscopy (< 3 yrs), Last colonoscopy:                            December 2018 Medicines:                Monitored Anesthesia Care Procedure:                Pre-Anesthesia Assessment:                           - Prior to the procedure, a History and Physical                            was performed, and patient medications and                            allergies were reviewed. The patient's tolerance of                            previous anesthesia was also reviewed. The risks                            and benefits of the procedure and the sedation                            options and risks were discussed with the patient.                            All questions were answered, and informed consent                            was obtained. Prior Anticoagulants: The patient has                            taken no previous anticoagulant or antiplatelet                            agents. ASA Grade Assessment: III - A patient with                            severe systemic disease. After reviewing the risks                            and benefits, the patient was deemed in  satisfactory condition to undergo the procedure.                           After obtaining informed consent, the colonoscope                            was passed under direct vision. Throughout the                            procedure, the patient's blood pressure, pulse, and                            oxygen saturations were monitored continuously. The                            Colonoscope was introduced through the anus and                             advanced to the cecum, identified by appendiceal                            orifice and ileocecal valve. The colonoscopy was                            performed without difficulty. The patient tolerated                            the procedure well. The quality of the bowel                            preparation was good. The ileocecal valve,                            appendiceal orifice, and rectum were photographed. Scope In: 8:55:26 AM Scope Out: 9:14:10 AM Scope Withdrawal Time: 0 hours 14 minutes 48 seconds  Total Procedure Duration: 0 hours 18 minutes 44 seconds  Findings:                 The digital rectal exam was normal.                           A 3 mm polyp was found in the transverse colon. The                            polyp was sessile. The polyp was removed with a                            cold snare. Resection and retrieval were complete.                           A 3 mm polyp was found in the descending colon. The                            polyp was  sessile. The polyp was removed with a                            cold snare. Resection and retrieval were complete.                           Scattered small and large-mouthed diverticula were                            found from cecum to sigmoid colon.                           Internal hemorrhoids were found during                            retroflexion. The hemorrhoids were small. Complications:            No immediate complications. Estimated Blood Loss:     Estimated blood loss was minimal. Impression:               - One 3 mm polyp in the transverse colon, removed                            with a cold snare. Resected and retrieved.                           - One 3 mm polyp in the descending colon, removed                            with a cold snare. Resected and retrieved.                           - Diverticulosis from cecum to sigmoid colon.                           - Internal  hemorrhoids. Recommendation:           - Patient has a contact number available for                            emergencies. The signs and symptoms of potential                            delayed complications were discussed with the                            patient. Return to normal activities tomorrow.                            Written discharge instructions were provided to the                            patient.                           - Resume previous diet.                           -  Continue present medications.                           - Await pathology results.                           - Repeat colonoscopy is recommended for                            surveillance. The colonoscopy date will be                            determined after pathology results from today's                            exam become available for review. Jerene Bears, MD 11/05/2018 9:17:06 AM This report has been signed electronically.

## 2018-11-05 NOTE — Progress Notes (Signed)
Asked patient all questions. 

## 2018-11-05 NOTE — Progress Notes (Signed)
Called to room to assist during endoscopic procedure.  Patient ID and intended procedure confirmed with present staff. Received instructions for my participation in the procedure from the performing physician.  

## 2018-11-05 NOTE — Patient Instructions (Signed)
Information on polyps, diverticulosis and hemorrhoids given to you today.  Await pathology results.  YOU HAD AN ENDOSCOPIC PROCEDURE TODAY AT [facility]:   Refer to the procedure report that was given to you for any specific questions about what was found during the examination.  If the procedure report does not answer your questions, please call your gastroenterologist to clarify.  If you requested that your care partner not be given the details of your procedure findings, then the procedure report has been included in a sealed envelope for you to review at your convenience later.  YOU SHOULD EXPECT: Some feelings of bloating in the abdomen. Passage of more gas than usual.  Walking can help get rid of the air that was put into your GI tract during the procedure and reduce the bloating. If you had a lower endoscopy (such as a colonoscopy or flexible sigmoidoscopy) you may notice spotting of blood in your stool or on the toilet paper. If you underwent a bowel prep for your procedure, you may not have a normal bowel movement for a few days.  Please Note:  You might notice some irritation and congestion in your nose or some drainage.  This is from the oxygen used during your procedure.  There is no need for concern and it should clear up in a day or so.  SYMPTOMS TO REPORT IMMEDIATELY:   Following lower endoscopy (colonoscopy or flexible sigmoidoscopy):  Excessive amounts of blood in the stool  Significant tenderness or worsening of abdominal pains  Swelling of the abdomen that is new, acute  Fever of 100F or higher   For urgent or emergent issues, a gastroenterologist can be reached at any hour by calling [phone].   DIET:  We do recommend a small meal at first, but then you may proceed to your regular diet.  Drink plenty of fluids but you should avoid alcoholic beverages for 24 hours.  ACTIVITY:  You should plan to take it easy for the rest of today and you should NOT  DRIVE or use heavy machinery until tomorrow (because of the sedation medicines used during the test).    FOLLOW UP: Our staff will call the number listed on your records the next business day following your procedure to check on you and address any questions or concerns that you may have regarding the information given to you following your procedure. If we do not reach you, we will leave a message.  However, if you are feeling well and you are not experiencing any problems, there is no need to return our call.  We will assume that you have returned to your regular daily activities without incident. We will be calling you two weeks following your procedure to see if you have developed any symptoms of the COVID-19.  If you develop any symptoms before then, please let us know.  If any biopsies were taken you will be contacted by phone or by letter within the next 1-3 weeks.  Please call us at [phone] if you have not heard about the biopsies in 3 weeks.    SIGNATURES/CONFIDENTIALITY: You and/or your care partner have signed paperwork which will be entered into your electronic medical record.  These signatures attest to the fact that that the information above on your After Visit Summary has been reviewed and is understood.  Full responsibility of the confidentiality of this discharge information lies with you and/or your care-partner.

## 2018-11-05 NOTE — Progress Notes (Signed)
A/ox3, pleased with MAC, report to RN 

## 2018-11-06 ENCOUNTER — Telehealth: Payer: Self-pay | Admitting: *Deleted

## 2018-11-06 NOTE — Telephone Encounter (Signed)
  Follow up Call-  Call back number 11/05/2018 05/29/2017  Post procedure Call Back phone  # [phone]  Permission to leave phone message Yes Yes  Some recent data might be hidden     Patient questions:  Do you have a fever, pain , or abdominal swelling? No. Pain Score  0 *  Have you tolerated food without any problems? Yes.    Have you been able to return to your normal activities? Yes.    Do you have any questions about your discharge instructions: Diet   No. Medications  No. Follow up visit  No.  Do you have questions or concerns about your Care? No.  Actions: * If pain score is 4 or above: No action needed, pain <4.

## 2018-11-07 ENCOUNTER — Encounter: Payer: Self-pay | Admitting: Internal Medicine

## 2018-11-08 ENCOUNTER — Telehealth: Payer: Self-pay | Admitting: *Deleted

## 2018-11-08 NOTE — Telephone Encounter (Signed)
1. Have you developed a fever since your procedure? no  2.   Have you had an respiratory symptoms (SOB or cough) since your procedure? no  3.   Have you tested positive for COVID 19 since your procedure no  3.   Have you had any family members/close contacts diagnosed with the COVID 19 since your procedure?  no   If any of these questions are a yes, please inquire if patient has been seen by family doctor and route this note to Tracy Walton, RN.  

## 2018-11-25 ENCOUNTER — Other Ambulatory Visit: Payer: Self-pay | Admitting: Internal Medicine

## 2018-12-04 ENCOUNTER — Other Ambulatory Visit: Payer: Self-pay | Admitting: Internal Medicine

## 2018-12-04 DIAGNOSIS — I10 Essential (primary) hypertension: Secondary | ICD-10-CM

## 2018-12-11 ENCOUNTER — Other Ambulatory Visit: Payer: Self-pay | Admitting: Internal Medicine

## 2018-12-11 DIAGNOSIS — E785 Hyperlipidemia, unspecified: Secondary | ICD-10-CM

## 2019-01-12 ENCOUNTER — Other Ambulatory Visit: Payer: Self-pay

## 2019-01-12 ENCOUNTER — Emergency Department (INDEPENDENT_AMBULATORY_CARE_PROVIDER_SITE_OTHER): Payer: Managed Care, Other (non HMO)

## 2019-01-12 ENCOUNTER — Emergency Department (HOSPITAL_BASED_OUTPATIENT_CLINIC_OR_DEPARTMENT_OTHER): Payer: Managed Care, Other (non HMO)

## 2019-01-12 ENCOUNTER — Emergency Department
Admission: EM | Admit: 2019-01-12 | Discharge: 2019-01-12 | Disposition: A | Discharge status: Home / Self Care | Payer: Managed Care, Other (non HMO) | Source: Home / Self Care

## 2019-01-12 DIAGNOSIS — S8992XA Unspecified injury of left lower leg, initial encounter: Secondary | ICD-10-CM

## 2019-01-12 DIAGNOSIS — W228XXA Striking against or struck by other objects, initial encounter: Secondary | ICD-10-CM | POA: Diagnosis not present

## 2019-01-12 DIAGNOSIS — S8012XA Contusion of left lower leg, initial encounter: Secondary | ICD-10-CM

## 2019-01-12 DIAGNOSIS — M79662 Pain in left lower leg: Secondary | ICD-10-CM
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
## 2019-01-12 NOTE — Discharge Instructions (Signed)
Take ibuprofen 200 mg 3 pills 2 or 3 times a day if needed for pain.  Take with food.  Try to elevate it when you are just sitting around.  If it is swelling more apply ice.  Things look good on x-ray, and this should just gradually resolve with time.  Return to your primary care doctor or here if needed for recheck.
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
## 2019-01-14 ENCOUNTER — Telehealth: Payer: Self-pay | Admitting: Internal Medicine
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
## 2019-01-25 ENCOUNTER — Other Ambulatory Visit: Payer: Self-pay | Admitting: Internal Medicine

## 2019-01-25 DIAGNOSIS — R21 Rash and other nonspecific skin eruption: Secondary | ICD-10-CM

## 2019-01-25 DIAGNOSIS — I10 Essential (primary) hypertension: Secondary | ICD-10-CM

## 2019-01-25 DIAGNOSIS — E559 Vitamin D deficiency, unspecified: Secondary | ICD-10-CM

## 2019-02-02 ENCOUNTER — Other Ambulatory Visit: Payer: Self-pay | Admitting: Internal Medicine

## 2019-02-25 ENCOUNTER — Other Ambulatory Visit: Payer: Self-pay | Admitting: Internal Medicine

## 2019-02-25 DIAGNOSIS — N522 Drug-induced erectile dysfunction: Secondary | ICD-10-CM

## 2019-03-01 ENCOUNTER — Telehealth: Payer: Self-pay

## 2019-03-01 NOTE — Telephone Encounter (Signed)
Key: [id number]

## 2019-03-01 NOTE — Telephone Encounter (Signed)
Pt is not recognized by plan.

## 2019-03-06 ENCOUNTER — Other Ambulatory Visit: Payer: Self-pay | Admitting: Internal Medicine

## 2019-03-06 DIAGNOSIS — M1000 Idiopathic gout, unspecified site: Secondary | ICD-10-CM

## 2019-03-25 ENCOUNTER — Other Ambulatory Visit: Payer: Self-pay | Admitting: Internal Medicine

## 2019-03-25 DIAGNOSIS — I10 Essential (primary) hypertension: Secondary | ICD-10-CM

## 2019-04-04 ENCOUNTER — Other Ambulatory Visit: Payer: Self-pay | Admitting: Internal Medicine

## 2019-04-20 ENCOUNTER — Other Ambulatory Visit: Payer: Self-pay | Admitting: Internal Medicine

## 2019-04-20 DIAGNOSIS — I5042 Chronic combined systolic (congestive) and diastolic (congestive) heart failure: Secondary | ICD-10-CM

## 2019-04-20 DIAGNOSIS — M25511 Pain in right shoulder: Secondary | ICD-10-CM

## 2019-04-20 DIAGNOSIS — I10 Essential (primary) hypertension: Secondary | ICD-10-CM

## 2019-04-22 ENCOUNTER — Other Ambulatory Visit: Payer: Self-pay | Admitting: Internal Medicine

## 2019-04-22 DIAGNOSIS — N522 Drug-induced erectile dysfunction: Secondary | ICD-10-CM

## 2019-04-30 ENCOUNTER — Other Ambulatory Visit: Payer: Self-pay | Admitting: Internal Medicine

## 2019-04-30 DIAGNOSIS — R21 Rash and other nonspecific skin eruption: Secondary | ICD-10-CM

## 2019-04-30 DIAGNOSIS — I10 Essential (primary) hypertension: Secondary | ICD-10-CM

## 2019-05-11 ENCOUNTER — Encounter (HOSPITAL_COMMUNITY): Payer: Self-pay

## 2019-05-11 ENCOUNTER — Ambulatory Visit (HOSPITAL_COMMUNITY)
Admission: EM | Admit: 2019-05-11 | Discharge: 2019-05-11 | Disposition: A | Discharge status: Home / Self Care | Payer: Managed Care, Other (non HMO) | Attending: Radiology | Admitting: Radiology

## 2019-05-11 ENCOUNTER — Other Ambulatory Visit: Payer: Self-pay

## 2019-05-11 DIAGNOSIS — M25519 Pain in unspecified shoulder: Secondary | ICD-10-CM

## 2019-05-11 DIAGNOSIS — M25512 Pain in left shoulder: Secondary | ICD-10-CM

## 2019-05-11 MED ORDER — CYCLOBENZAPRINE HCL 10 MG PO TABS: 5.0000 mg | ORAL_TABLET | Freq: Two times a day (BID) | ORAL | 0 refills | Status: DC | PRN

## 2019-05-11 MED ORDER — DICLOFENAC SODIUM 50 MG PO TBEC: 50.0000 mg | DELAYED_RELEASE_TABLET | Freq: Two times a day (BID) | ORAL | 0 refills | Status: DC
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
## 2019-05-13 ENCOUNTER — Telehealth: Payer: Self-pay | Admitting: Internal Medicine
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
## 2019-05-29 ENCOUNTER — Other Ambulatory Visit: Payer: Self-pay

## 2019-05-29 ENCOUNTER — Ambulatory Visit: Payer: Self-pay

## 2019-05-29 ENCOUNTER — Encounter: Payer: Self-pay | Admitting: Family Medicine

## 2019-05-29 ENCOUNTER — Ambulatory Visit (INDEPENDENT_AMBULATORY_CARE_PROVIDER_SITE_OTHER): Payer: Managed Care, Other (non HMO) | Admitting: Family Medicine

## 2019-05-29 VITALS — BP 112/60 | HR 55 | Ht 71.0 in | Wt 237.0 lb

## 2019-05-29 DIAGNOSIS — M25511 Pain in right shoulder: Secondary | ICD-10-CM

## 2019-05-29 DIAGNOSIS — M25512 Pain in left shoulder: Secondary | ICD-10-CM | POA: Diagnosis not present

## 2019-05-29 DIAGNOSIS — M12811 Other specific arthropathies, not elsewhere classified, right shoulder: Secondary | ICD-10-CM | POA: Diagnosis not present

## 2019-05-29 DIAGNOSIS — M12812 Other specific arthropathies, not elsewhere classified, left shoulder: Secondary | ICD-10-CM

## 2019-05-29 DIAGNOSIS — M19011 Primary osteoarthritis, right shoulder: Secondary | ICD-10-CM | POA: Insufficient documentation

## 2019-05-29 DIAGNOSIS — G8929 Other chronic pain: Secondary | ICD-10-CM

## 2019-05-29 DIAGNOSIS — M1000 Idiopathic gout, unspecified site: Secondary | ICD-10-CM

## 2019-05-29 DIAGNOSIS — M19012 Primary osteoarthritis, left shoulder: Secondary | ICD-10-CM

## 2019-05-29 MED ORDER — COLCHICINE 0.6 MG PO TABS: 0.6000 mg | ORAL_TABLET | Freq: Every day | ORAL | 2 refills | Status: DC

## 2019-05-29 NOTE — Assessment & Plan Note (Signed)
Likely flare causing some of the shoulder as well as the early arthritic changes.  Discussed over-the-counter medications, daily colchicine given for flares avoided twice daily secondary to statin use.  Follow-up with me again 4 to 5 weeks

## 2019-05-29 NOTE — Patient Instructions (Addendum)
Exercises 3x a week Ice 2x a day for 20 minutes Tart Cherry Extract capsules 1200mg  at night Injected both shoulders today  Cholchicine 2x a day for 5 days when having a flare See me again in 4-5 weeks    [address] [place], [place] [address] Phone [phone]

## 2019-05-29 NOTE — Assessment & Plan Note (Signed)
Bilateral injections given today.  Tolerated the procedure well.  We discussed icing regimen, home exercise, topical anti-inflammatories.  We did appear on ultrasound the patient also had what appeared to be more of a gout flare.  Given multiple medicines for breakthrough.  Topical anti-inflammatories.  Patient will follow up again in 4 to 5 weeks.  Worsening symptoms consider acromioclavicular injections and formal physical therapy
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
## 2019-06-11 ENCOUNTER — Other Ambulatory Visit: Payer: Self-pay | Admitting: Internal Medicine

## 2019-06-11 NOTE — Telephone Encounter (Signed)
Medication Refill - Medication: zolpidem (AMBIEN) 5 MG tablet    Has the patient contacted their pharmacy? Yes.   (Agent: If no, request that the patient contact the pharmacy for the refill.) (Agent: If yes, when and what did the pharmacy advise?)  Preferred Pharmacy (with phone number or street name):  [id number] - [place], [place] Phone:  [phone]  Fax:  [phone]       Agent: Please be advised that RX refills may take up to 3 business days. We ask that you follow-up with your pharmacy.

## 2019-06-11 NOTE — Telephone Encounter (Signed)
Requested medication (s) are due for refill today: yes  Requested medication (s) are on the active medication list: yes  Last refill: 04/05/2019  Future visit scheduled:yes  Notes to clinic: Refill cannot be delegated    Requested Prescriptions  Pending Prescriptions Disp Refills   zolpidem (AMBIEN) 5 MG tablet 30 tablet 1      Not Delegated - Psychiatry:  Anxiolytics/Hypnotics Failed - 06/11/2019  8:51 AM      Failed - This refill cannot be delegated      Failed - Urine Drug Screen completed in last 360 days.      Passed - Valid encounter within last 6 months    Recent Outpatient Visits           1 week ago Chronic pain of both shoulders   [place], DO   9 months ago CKD (chronic kidney disease) stage 3, GFR 30-59 ml/min ([place])   [place], Thomas L, MD   1 year ago Rotator cuff syndrome of right shoulder   [facility] [place], Enid Baas, MD   1 year ago Acute pain of right shoulder   [place], Enid Baas, MD   1 year ago Acute pain of right shoulder   [place], [place] Rep[place], [place]       Future Appointments             In 2 days Janith Lima, MD [facility] [place], [place]   In 2 weeks Lyndal Pulley, [place], [facility]

## 2019-06-13 ENCOUNTER — Encounter: Payer: Self-pay | Admitting: Internal Medicine

## 2019-06-13 ENCOUNTER — Other Ambulatory Visit: Payer: Self-pay

## 2019-06-13 ENCOUNTER — Ambulatory Visit (INDEPENDENT_AMBULATORY_CARE_PROVIDER_SITE_OTHER): Payer: Managed Care, Other (non HMO) | Admitting: Internal Medicine

## 2019-06-13 ENCOUNTER — Other Ambulatory Visit (INDEPENDENT_AMBULATORY_CARE_PROVIDER_SITE_OTHER): Payer: Managed Care, Other (non HMO)

## 2019-06-13 VITALS — BP 122/76 | HR 66 | Temp 98.2°F | Resp 16 | Ht 71.0 in | Wt 226.8 lb

## 2019-06-13 DIAGNOSIS — E559 Vitamin D deficiency, unspecified: Secondary | ICD-10-CM

## 2019-06-13 DIAGNOSIS — M1000 Idiopathic gout, unspecified site: Secondary | ICD-10-CM

## 2019-06-13 DIAGNOSIS — N1831 Chronic kidney disease, stage 3a: Secondary | ICD-10-CM

## 2019-06-13 DIAGNOSIS — I10 Essential (primary) hypertension: Secondary | ICD-10-CM

## 2019-06-13 DIAGNOSIS — M545 Low back pain, unspecified: Secondary | ICD-10-CM

## 2019-06-13 DIAGNOSIS — F5104 Psychophysiologic insomnia: Secondary | ICD-10-CM

## 2019-06-13 LAB — CBC WITH DIFFERENTIAL/PLATELET
Basophils Absolute: 0.1 10*3/uL (ref 0.0–0.1)
Basophils Relative: 1.2 % (ref 0.0–3.0)
Eosinophils Absolute: 0.1 10*3/uL (ref 0.0–0.7)
Eosinophils Relative: 2.8 % (ref 0.0–5.0)
HCT: 41.3 % (ref 39.0–52.0)
Hemoglobin: 13.7 g/dL (ref 13.0–17.0)
Lymphocytes Relative: 33.8 % (ref 12.0–46.0)
Lymphs Abs: 1.4 10*3/uL (ref 0.7–4.0)
MCHC: 33.3 g/dL (ref 30.0–36.0)
MCV: 94.2 fl (ref 78.0–100.0)
Monocytes Absolute: 0.5 10*3/uL (ref 0.1–1.0)
Monocytes Relative: 11.2 % (ref 3.0–12.0)
Neutro Abs: 2.2 10*3/uL (ref 1.4–7.7)
Neutrophils Relative %: 51 % (ref 43.0–77.0)
Platelets: 180 10*3/uL (ref 150.0–400.0)
RBC: 4.39 Mil/uL (ref 4.22–5.81)
RDW: 14 % (ref 11.5–15.5)
WBC: 4.2 10*3/uL (ref 4.0–10.5)

## 2019-06-13 LAB — BASIC METABOLIC PANEL
BUN: 23 mg/dL (ref 6–23)
CO2: 28 mEq/L (ref 19–32)
Calcium: 9.2 mg/dL (ref 8.4–10.5)
Chloride: 101 mEq/L (ref 96–112)
Creatinine, Ser: 1.83 mg/dL — ABNORMAL HIGH (ref 0.40–1.50)
GFR: 44.55 mL/min — ABNORMAL LOW (ref >60.00)
Glucose, Bld: 122 mg/dL — ABNORMAL HIGH (ref 70–99)
Potassium: 3.8 mEq/L (ref 3.5–5.1)
Sodium: 137 mEq/L (ref 135–145)

## 2019-06-13 LAB — VITAMIN D 25 HYDROXY (VIT D DEFICIENCY, FRACTURES): VITD: 52.9 ng/mL (ref 30.00–100.00)

## 2019-06-13 LAB — HEPATIC FUNCTION PANEL
ALT: 11 U/L (ref 0–53)
AST: 12 U/L (ref 0–37)
Albumin: 4 g/dL (ref 3.5–5.2)
Alkaline Phosphatase: 72 U/L (ref 39–117)
Bilirubin, Direct: 0.3 mg/dL (ref 0.0–0.3)
Total Bilirubin: 0.9 mg/dL (ref 0.2–1.2)
Total Protein: 7.7 g/dL (ref 6.0–8.3)

## 2019-06-13 LAB — URIC ACID: Uric Acid, Serum: 6 mg/dL (ref 4.0–7.8)

## 2019-06-13 MED ORDER — CYCLOBENZAPRINE HCL 10 MG PO TABS: 10.0000 mg | ORAL_TABLET | Freq: Three times a day (TID) | ORAL | 1 refills | Status: DC | PRN

## 2019-06-13 MED ORDER — ZOLPIDEM TARTRATE 5 MG PO TABS: ORAL_TABLET | ORAL | 2 refills | Status: DC

## 2019-06-13 NOTE — Progress Notes (Signed)
Subjective:  Patient ID: John Hogan., male    DOB: Feb 19, 1951  Age: 68 y.o. MRN: [id number]  CC: Hypertension and Back Pain  This visit occurred during the SARS-CoV-2 public health emergency.  Safety protocols were in place, including screening questions prior to the visit, additional usage of staff PPE, and extensive cleaning of exam room while observing appropriate contact time as indicated for disinfecting solutions.   HPI John Hogan. presents for f/up - He complains of a 1 week history of nontraumatic right lower back pain.  He describes it as a sharp pain with spasms that occasionally radiates into his right hip.  He says the pain does not radiate beyond his right hip and he denies paresthesias in his lower extremities.  He tells me he is not taking anything for the discomfort.  He has lost weight with lifestyle modifications.  He denies any recent episodes of dizziness, lightheadedness, chest pain, shortness of breath, or edema.  Outpatient Medications Prior to Visit  Medication Sig Dispense Refill  . allopurinol (ZYLOPRIM) 300 MG tablet TAKE 1 TABLET BY MOUTH EVERY DAY 90 tablet 1  . amLODipine (NORVASC) 10 MG tablet TAKE 1 TABLET BY MOUTH EVERY DAY 90 tablet 1  . colchicine 0.6 MG tablet Take 1 tablet (0.6 mg total) by mouth daily. When having flare 5 tablet 2  . [facility]S D3 50 MCG (2000 UT) CAPS TAKE 1 CAPSULE BY MOUTH EVERY DAY 90 capsule 1  . losartan (COZAAR) 25 MG tablet TAKE 2 TABLETS BY MOUTH EVERY DAY 180 tablet 1  . pravastatin (PRAVACHOL) 20 MG tablet TAKE 1 TABLET BY MOUTH EVERY DAY 90 tablet 1  . sildenafil (REVATIO) 20 MG tablet TAKE 4 TABLETS BY MOUTH DAILY AS NEEDED. 60 tablet 5  . SIMBRINZA 1-0.2 % SUSP INSTILL 1 DROP INTO BOTH EYES TWICE A DAY  3  . timolol (TIMOPTIC) 0.5 % ophthalmic solution PUT 1 DROP IN BOTH EYES TWICE A DAY  3  . Travoprost, BAK Free, (TRAVATAN) 0.004 % SOLN ophthalmic solution Place 1 drop into both eyes at bedtime.     . triamcinolone  cream (KENALOG) 0.1 % APPLY TO AFFECTED AREA TWICE A DAY 30 g 0  . cyclobenzaprine (FLEXERIL) 10 MG tablet Take 0.5 tablets (5 mg total) by mouth 2 (two) times daily as needed for muscle spasms. 20 tablet 0  . diclofenac (VOLTAREN) 50 MG EC tablet Take 1 tablet (50 mg total) by mouth 2 (two) times daily. 14 tablet 0  . hydrochlorothiazide (MICROZIDE) 12.5 MG capsule TAKE 1 CAPSULE BY MOUTH EVERY DAY 90 capsule 0  . meloxicam (MOBIC) 15 MG tablet TAKE 1 TABLET BY MOUTH EVERY DAY 90 tablet 0  . zolpidem (AMBIEN) 5 MG tablet TAKE 1 TABLET BY MOUTH EVERY DAY AT BEDTIME AS NEEDED 30 tablet 1   No facility-administered medications prior to visit.    ROS Review of Systems  Constitutional: Negative for chills, diaphoresis, fatigue and unexpected weight change.  HENT: Negative.   Eyes: Negative for visual disturbance.  Respiratory: Negative for cough, chest tightness, shortness of breath and wheezing.   Cardiovascular: Negative for chest pain, palpitations and leg swelling.  Gastrointestinal: Negative for abdominal pain, constipation, diarrhea, nausea and vomiting.  Endocrine: Negative.   Genitourinary: Negative.  Negative for difficulty urinating and flank pain.  Musculoskeletal: Positive for back pain. Negative for arthralgias, myalgias and neck pain.  Skin: Negative.   Neurological: Negative.  Negative for dizziness, weakness and light-headedness.  Hematological: Negative for  adenopathy. Does not bruise/bleed easily.  Psychiatric/Behavioral: Positive for sleep disturbance. Negative for decreased concentration, dysphoric mood and suicidal ideas. The patient is not nervous/anxious.     Objective:  BP 122/76 (BP Location: Left Arm, Patient Position: Sitting, Cuff Size: Large)   Pulse 66   Temp 98.2 F (36.8 C) (Oral)   Resp 16   Ht 5\' 11"  (1.803 m)   Wt 226 lb 12 oz (102.9 kg)   SpO2 98%   BMI 31.63 kg/m   BP Readings from Last 3 Encounters:  06/13/19 122/76  05/29/19 112/60    05/11/19 131/80    Wt Readings from Last 3 Encounters:  06/13/19 226 lb 12 oz (102.9 kg)  05/29/19 237 lb (107.5 kg)  11/05/18 237 lb (107.5 kg)    Physical Exam Vitals reviewed.  Constitutional:      Appearance: He is obese.  HENT:     Nose: Nose normal.     Mouth/Throat:     Mouth: Mucous membranes are moist.  Eyes:     General: No scleral icterus.    Conjunctiva/sclera: Conjunctivae normal.  Cardiovascular:     Rate and Rhythm: Normal rate and regular rhythm.     Heart sounds: No murmur.  Pulmonary:     Effort: Pulmonary effort is normal.     Breath sounds: No stridor. No wheezing, rhonchi or rales.  Abdominal:     General: Abdomen is protuberant. Bowel sounds are normal. There is no distension.     Palpations: Abdomen is soft. There is no hepatomegaly or splenomegaly.     Tenderness: There is no abdominal tenderness.  Musculoskeletal:        General: Normal range of motion.     Cervical back: Neck supple.     Lumbar back: Normal. No edema, spasms, tenderness or bony tenderness. Normal range of motion. Negative right straight leg raise test and negative left straight leg raise test.     Right lower leg: No edema.     Left lower leg: No edema.  Lymphadenopathy:     Cervical: No cervical adenopathy.  Skin:    General: Skin is warm and dry.  Neurological:     General: No focal deficit present.     Mental Status: He is alert.  Psychiatric:        Mood and Affect: Mood normal.        Behavior: Behavior normal.        Thought Content: Thought content normal.        Judgment: Judgment normal.     Lab Results  Component Value Date   WBC 4.2 06/13/2019   HGB 13.7 06/13/2019   HCT 41.3 06/13/2019   PLT 180.0 06/13/2019   GLUCOSE 122 (H) 06/13/2019   CHOL 169 09/12/2018   TRIG 68.0 09/12/2018   HDL 66.10 09/12/2018   LDLDIRECT 140.7 02/27/2013   LDLCALC 89 09/12/2018   ALT 11 06/13/2019   AST 12 06/13/2019   NA 137 06/13/2019   K 3.8 06/13/2019   CL 101  06/13/2019   CREATININE 1.83 (H) 06/13/2019   BUN 23 06/13/2019   CO2 28 06/13/2019   TSH 1.70 09/12/2018   PSA 2.04 09/12/2018    No results found.  Assessment & Plan:   John Hogan was seen today for hypertension and back pain.  Diagnoses and all orders for this visit:  Essential hypertension, benign- His blood pressure is somewhat overcontrolled and he has renal insufficiency and hyperglycemia.  I recommended that he stop  taking hydrochlorothiazide.  He will stay on the current doses of amlodipine and losartan. -     Cancel: Basic metabolic panel; Future -     CBC with Differential; Future -     Hepatic function panel; Future  Stage 3a chronic kidney disease- His renal function is relatively stable.  I recommended that he avoid anti-inflammatories. -     CBC with Differential; Future  Idiopathic gout, unspecified chronicity, unspecified site- He has achieved his uric acid goal of 6.0.  Will continue the current dose of allopurinol. -     Basic metabolic panel; Future -     Uric acid; Future  Vitamin D deficiency -     Vitamin D 25 hydroxy; Future  Acute right-sided low back pain without sciatica- He has nontraumatic back pain with no alarming signs or symptoms.  I recommended that he treat this with Tylenol and cyclobenzaprine as needed. -     cyclobenzaprine (FLEXERIL) 10 MG tablet; Take 1 tablet (10 mg total) by mouth 3 (three) times daily as needed for muscle spasms.  Psychophysiological insomnia -     zolpidem (AMBIEN) 5 MG tablet; TAKE 1 TABLET BY MOUTH EVERY DAY AT BEDTIME AS NEEDED   I have discontinued Donzetta Starch Jr.'s meloxicam, hydrochlorothiazide, and diclofenac. I have also changed his cyclobenzaprine. Additionally, I am having him maintain his Travoprost (BAK Free), timolol, Simbrinza, pravastatin, CVS D3, allopurinol, amLODipine, losartan, sildenafil, triamcinolone cream, colchicine, and zolpidem.  Meds ordered this encounter  Medications  . cyclobenzaprine  (FLEXERIL) 10 MG tablet    Sig: Take 1 tablet (10 mg total) by mouth 3 (three) times daily as needed for muscle spasms.    Dispense:  90 tablet    Refill:  1  . zolpidem (AMBIEN) 5 MG tablet    Sig: TAKE 1 TABLET BY MOUTH EVERY DAY AT BEDTIME AS NEEDED    Dispense:  30 tablet    Refill:  2    Not to exceed 2 additional fills before 08/02/2019     Follow-up: Return in about 6 weeks (around 07/25/2019).  Scarlette Calico, MD

## 2019-06-13 NOTE — Patient Instructions (Signed)
Acute Back Pain, Adult Acute back pain is sudden and usually short-lived. It is often caused by an injury to the muscles and tissues in the back. The injury may result from:  A muscle or ligament getting overstretched or torn (strained). Ligaments are tissues that connect bones to each other. Lifting something improperly can cause a back strain.  Wear and tear (degeneration) of the spinal disks. Spinal disks are circular tissue that provides cushioning between the bones of the spine (vertebrae).  Twisting motions, such as while playing sports or doing yard work.  A hit to the back.  Arthritis. You may have a physical exam, lab tests, and imaging tests to find the cause of your pain. Acute back pain usually goes away with rest and home care. Follow these instructions at home: Managing pain, stiffness, and swelling  Take over-the-counter and prescription medicines only as told by your health care provider.  Your health care provider may recommend applying ice during the first 24-48 hours after your pain starts. To do this: ? Put ice in a plastic bag. ? Place a towel between your skin and the bag. ? Leave the ice on for 20 minutes, 2-3 times a day.  If directed, apply heat to the affected area as often as told by your health care provider. Use the heat source that your health care provider recommends, such as a moist heat pack or a heating pad. ? Place a towel between your skin and the heat source. ? Leave the heat on for 20-30 minutes. ? Remove the heat if your skin turns bright red. This is especially important if you are unable to feel pain, heat, or cold. You have a greater risk of getting burned. Activity   Do not stay in bed. Staying in bed for more than 1-2 days can delay your recovery.  Sit up and stand up straight. Avoid leaning forward when you sit, or hunching over when you stand. ? If you work at a desk, sit close to it so you do not need to lean over. Keep your chin tucked  in. Keep your neck drawn back, and keep your elbows bent at a right angle. Your arms should look like the letter "L." ? Sit high and close to the steering wheel when you drive. Add lower back (lumbar) support to your car seat, if needed.  Take short walks on even surfaces as soon as you are able. Try to increase the length of time you walk each day.  Do not sit, drive, or stand in one place for more than 30 minutes at a time. Sitting or standing for long periods of time can put stress on your back.  Do not drive or use heavy machinery while taking prescription pain medicine.  Use proper lifting techniques. When you bend and lift, use positions that put less stress on your back: ? Bend your knees. ? Keep the load close to your body. ? Avoid twisting.  Exercise regularly as told by your health care provider. Exercising helps your back heal faster and helps prevent back injuries by keeping muscles strong and flexible.  Work with a physical therapist to make a safe exercise program, as recommended by your health care provider. Do any exercises as told by your physical therapist. Lifestyle  Maintain a healthy weight. Extra weight puts stress on your back and makes it difficult to have good posture.  Avoid activities or situations that make you feel anxious or stressed. Stress and anxiety increase muscle   tension and can make back pain worse. Learn ways to manage anxiety and stress, such as through exercise. General instructions  Sleep on a firm mattress in a comfortable position. Try lying on your side with your knees slightly bent. If you lie on your back, put a pillow under your knees.  Follow your treatment plan as told by your health care provider. This may include: ? Cognitive or behavioral therapy. ? Acupuncture or massage therapy. ? Meditation or yoga. Contact a health care provider if:  You have pain that is not relieved with rest or medicine.  You have increasing pain going down  into your legs or buttocks.  Your pain does not improve after 2 weeks.  You have pain at night.  You lose weight without trying.  You have a fever or chills. Get help right away if:  You develop new bowel or bladder control problems.  You have unusual weakness or numbness in your arms or legs.  You develop nausea or vomiting.  You develop abdominal pain.  You feel faint. Summary  Acute back pain is sudden and usually short-lived.  Use proper lifting techniques. When you bend and lift, use positions that put less stress on your back.  Take over-the-counter and prescription medicines and apply heat or ice as directed by your health care provider. This information is not intended to replace advice given to you by your health care provider. Make sure you discuss any questions you have with your health care provider. Document Released: 06/13/2005 Document Revised: 10/02/2018 Document Reviewed: 01/25/2017 Elsevier Patient Education  2020 [employer]  

## 2019-06-16 ENCOUNTER — Other Ambulatory Visit: Payer: Self-pay | Admitting: Internal Medicine

## 2019-06-16 DIAGNOSIS — E785 Hyperlipidemia, unspecified: Secondary | ICD-10-CM

## 2019-06-26 ENCOUNTER — Ambulatory Visit: Payer: Managed Care, Other (non HMO) | Admitting: Family Medicine

## 2019-06-26 NOTE — Progress Notes (Deleted)
[facility] [place] Phone: [phone] Subjective:    I'm seeing this patient by the request  of:    CC:   [id number]   05/29/2019 Bilateral injections given today.  Tolerated the procedure well.  We discussed icing regimen, home exercise, topical anti-inflammatories.  We did appear on ultrasound the patient also had what appeared to be more of a gout flare.  Given multiple medicines for breakthrough.  Topical anti-inflammatories.  Patient will follow up again in 4 to 5 weeks.  Worsening symptoms consider acromioclavicular injections and formal physical therapy  Update 06/26/2019 John Hogan. is a 68 y.o. male coming in with complaint of ***  Onset-  Location Duration-  Character- Aggravating factors- Reliving factors-  Therapies tried-  Severity-     Past Medical History:  Diagnosis Date  . AI (aortic insufficiency)   . Chronic kidney disease   . Glaucoma   . Gout   . H/O exercise stress test    Myoview 01/03/12: No scar or ischemia, EF 53%  . Hyperlipidemia   . Hypertension   . Hypertensive cardiomyopathy ([place])    EF previously 35%;  echo 12/2012 EF 55%, mild AI, ascending aorta mildly dilated, mild LAE   Past Surgical History:  Procedure Laterality Date  . COLONOSCOPY    . CYST REMOVAL HAND    . HERNIA REPAIR     Umbilical  . POLYPECTOMY    . WRIST SURGERY     right   Social History   Socioeconomic History  . Marital status: Married    Spouse name: Not on file  . Number of children: 2  . Years of education: Not on file  . Highest education level: Not on file  Occupational History    Employer: [employer]  Tobacco Use  . Smoking status: Former Smoker    Packs/day: 1.00    Years: 2.00    Pack years: 2.00    Types: Cigarettes    Quit date: 01/10/1975    Years since quitting: 44.4  . Smokeless tobacco: Never Used  . Tobacco comment: Only smoked two years  Substance and Sexual  Activity  . Alcohol use: No  . Drug use: No  . Sexual activity: Yes  Other Topics Concern  . Not on file  Social History Narrative   Lives at home with wife.   Social Determinants of Health   Financial Resource Strain:   . Difficulty of Paying Living Expenses: Not on file  Food Insecurity:   . Worried About Charity fundraiser in the Last Year: Not on file  . Ran Out of Food in the Last Year: Not on file  Transportation Needs:   . Lack of Transportation (Medical): Not on file  . Lack of Transportation (Non-Medical): Not on file  Physical Activity:   . Days of Exercise per Week: Not on file  . Minutes of Exercise per Session: Not on file  Stress:   . Feeling of Stress : Not on file  Social Connections:   . Frequency of Communication with Friends and Family: Not on file  . Frequency of Social Gatherings with Friends and Family: Not on file  . Attends Religious Services: Not on file  . Active Member of Clubs or Organizations: Not on file  . Attends Archivist Meetings: Not on file  . Marital Status: Not on file   No Known Allergies Family History  Problem Relation Age of  Onset  . Hypertension Father   . Colon cancer Father 25  . Hyperlipidemia Father   . Heart disease Father   . Diabetes Father   . Stroke Mother        deceased  . Colon cancer Mother 38  . Arthritis Mother   . Hyperlipidemia Mother   . Hypertension Mother   . Diabetes Mother   . Colon polyps Sister   . Colon polyps Brother   . COPD Neg Hx   . Alcohol abuse Neg Hx   . Kidney disease Neg Hx   . Esophageal cancer Neg Hx   . Rectal cancer Neg Hx   . Stomach cancer Neg Hx      Current Outpatient Medications (Cardiovascular):  .  amLODipine (NORVASC) 10 MG tablet, TAKE 1 TABLET BY MOUTH EVERY DAY .  losartan (COZAAR) 25 MG tablet, TAKE 2 TABLETS BY MOUTH EVERY DAY .  pravastatin (PRAVACHOL) 20 MG tablet, TAKE 1 TABLET BY MOUTH EVERY DAY .  sildenafil (REVATIO) 20 MG tablet, TAKE 4  TABLETS BY MOUTH DAILY AS NEEDED.   Current Outpatient Medications (Analgesics):  .  allopurinol (ZYLOPRIM) 300 MG tablet, TAKE 1 TABLET BY MOUTH EVERY DAY .  colchicine 0.6 MG tablet, Take 1 tablet (0.6 mg total) by mouth daily. When having flare   Current Outpatient Medications (Other):  Marland Kitchen  [facility] D3 50 MCG (2000 UT) CAPS, TAKE 1 CAPSULE BY MOUTH EVERY DAY .  cyclobenzaprine (FLEXERIL) 10 MG tablet, Take 1 tablet (10 mg total) by mouth 3 (three) times daily as needed for muscle spasms. Marland Kitchen  SIMBRINZA 1-0.2 % SUSP, INSTILL 1 DROP INTO BOTH EYES TWICE A DAY .  timolol (TIMOPTIC) 0.5 % ophthalmic solution, PUT 1 DROP IN BOTH EYES TWICE A DAY .  Travoprost, BAK Free, (TRAVATAN) 0.004 % SOLN ophthalmic solution, Place 1 drop into both eyes at bedtime.  .  triamcinolone cream (KENALOG) 0.1 %, APPLY TO AFFECTED AREA TWICE A DAY .  zolpidem (AMBIEN) 5 MG tablet, TAKE 1 TABLET BY MOUTH EVERY DAY AT BEDTIME AS NEEDED    Past medical history, social, surgical and family history all reviewed in electronic medical record.  No pertanent information unless stated regarding to the chief complaint.   Review of Systems:  No headache, visual changes, nausea, vomiting, diarrhea, constipation, dizziness, abdominal pain, skin rash, fevers, chills, night sweats, weight loss, swollen lymph nodes, body aches, joint swelling, muscle aches, chest pain, shortness of breath, mood changes.   Objective  There were no vitals taken for this visit. Systems examined below as of    General: No apparent distress alert and oriented x3 mood and affect normal, dressed appropriately.  HEENT: Pupils equal, extraocular movements intact  Respiratory: Patient's speak in full sentences and does not appear short of breath  Cardiovascular: No lower extremity edema, non tender, no erythema  Skin: Warm dry intact with no signs of infection or rash on extremities or on axial skeleton.  Abdomen: Soft nontender  Neuro: Cranial nerves II  through XII are intact, neurovascularly intact in all extremities with 2+ DTRs and 2+ pulses.  Lymph: No lymphadenopathy of posterior or anterior cervical chain or axillae bilaterally.  Gait normal with good balance and coordination.  MSK:  Non tender with full range of motion and good stability and symmetric strength and tone of shoulders, elbows, wrist, hip, knee and ankles bilaterally.     Impression and Recommendations:     This case required medical decision making of moderate  complexity. The above documentation has been reviewed and is accurate and complete Jacqualin Combes       Note: This dictation was prepared with Dragon dictation along with smaller phrase technology. Any transcriptional errors that result from this process are unintentional.

## 2019-07-14 ENCOUNTER — Other Ambulatory Visit: Payer: Self-pay | Admitting: Internal Medicine

## 2019-07-14 DIAGNOSIS — N522 Drug-induced erectile dysfunction: Secondary | ICD-10-CM

## 2019-07-27 ENCOUNTER — Other Ambulatory Visit: Payer: Self-pay | Admitting: Internal Medicine

## 2019-07-27 DIAGNOSIS — E559 Vitamin D deficiency, unspecified: Secondary | ICD-10-CM

## 2019-07-28 IMAGING — DX DG SHOULDER 2+V*R*
3 series · 3 of 3 positions shown · non-contrast
Comparison: None.

CLINICAL DATA: 67-year-old male with generalized shoulder pain for
3 weeks. Heard pop. Initial encounter.

EXAM:
RIGHT SHOULDER - 2+ VIEW

[grashey]
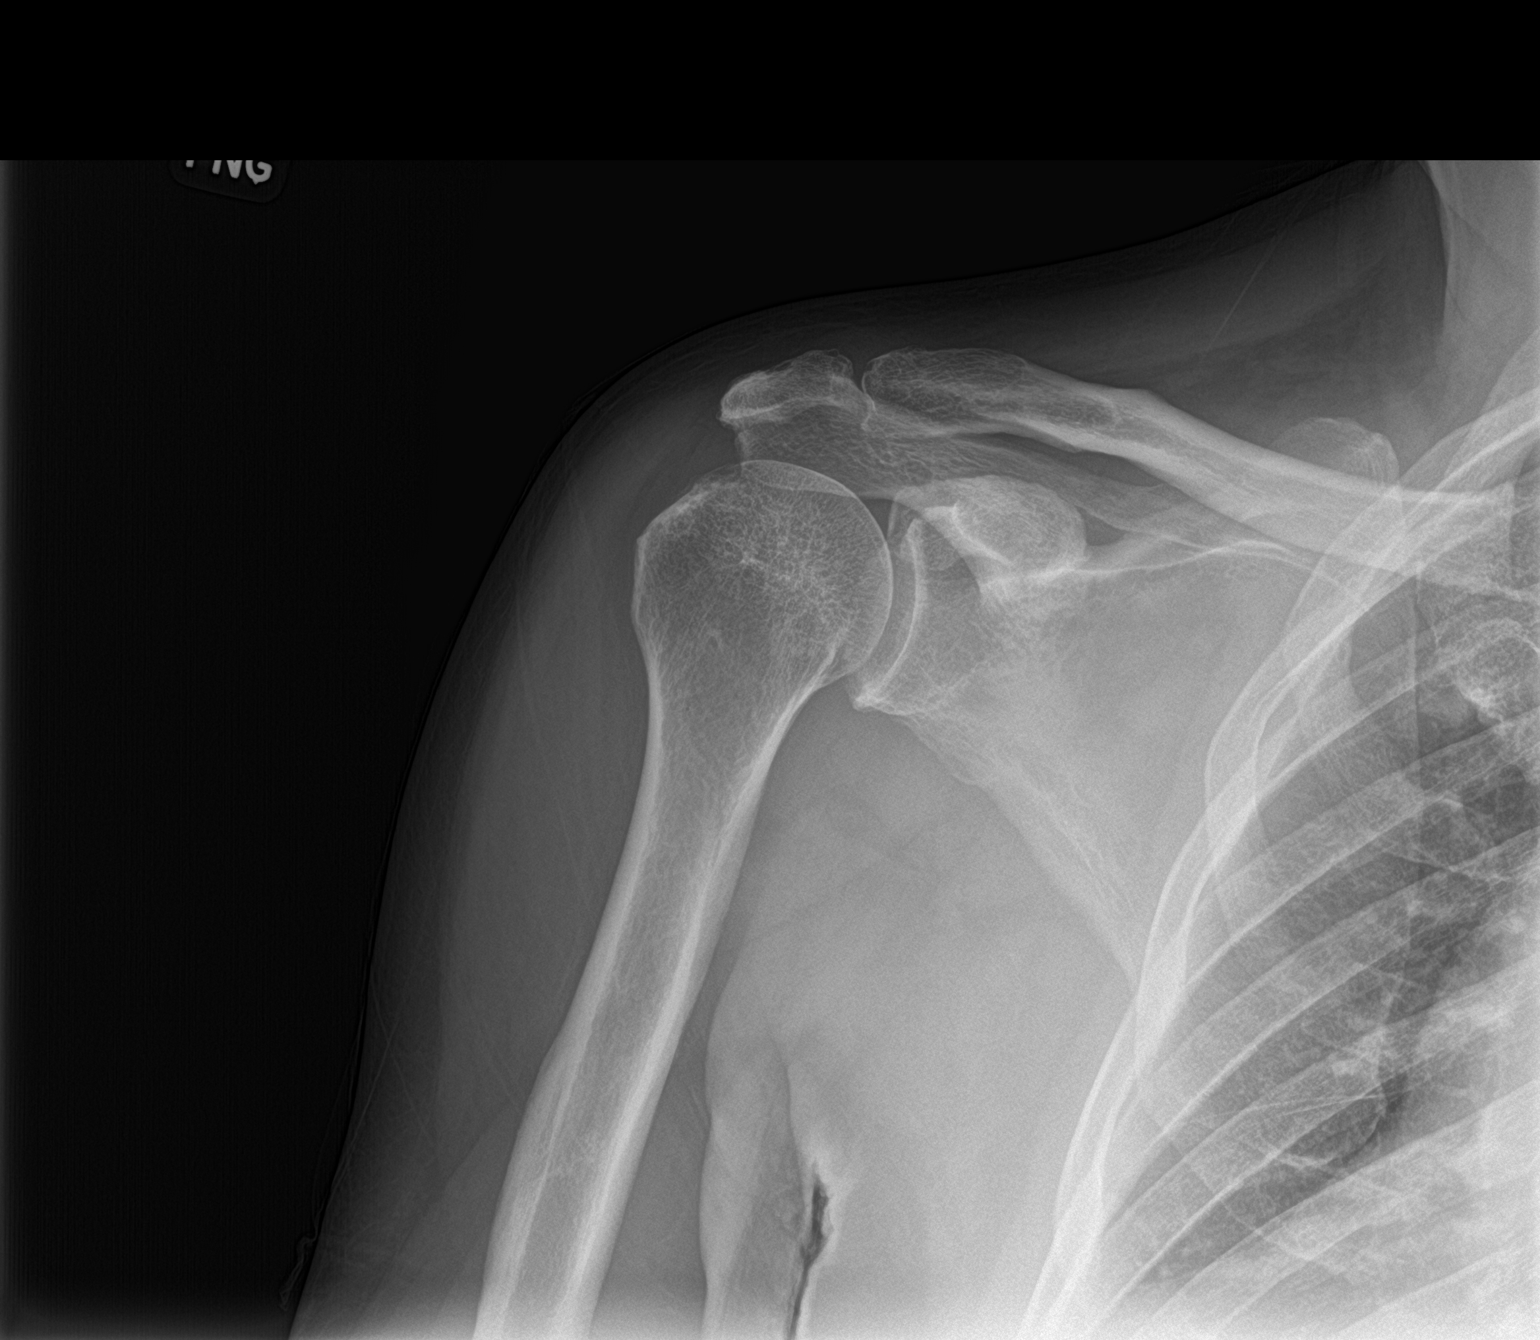

[y view]
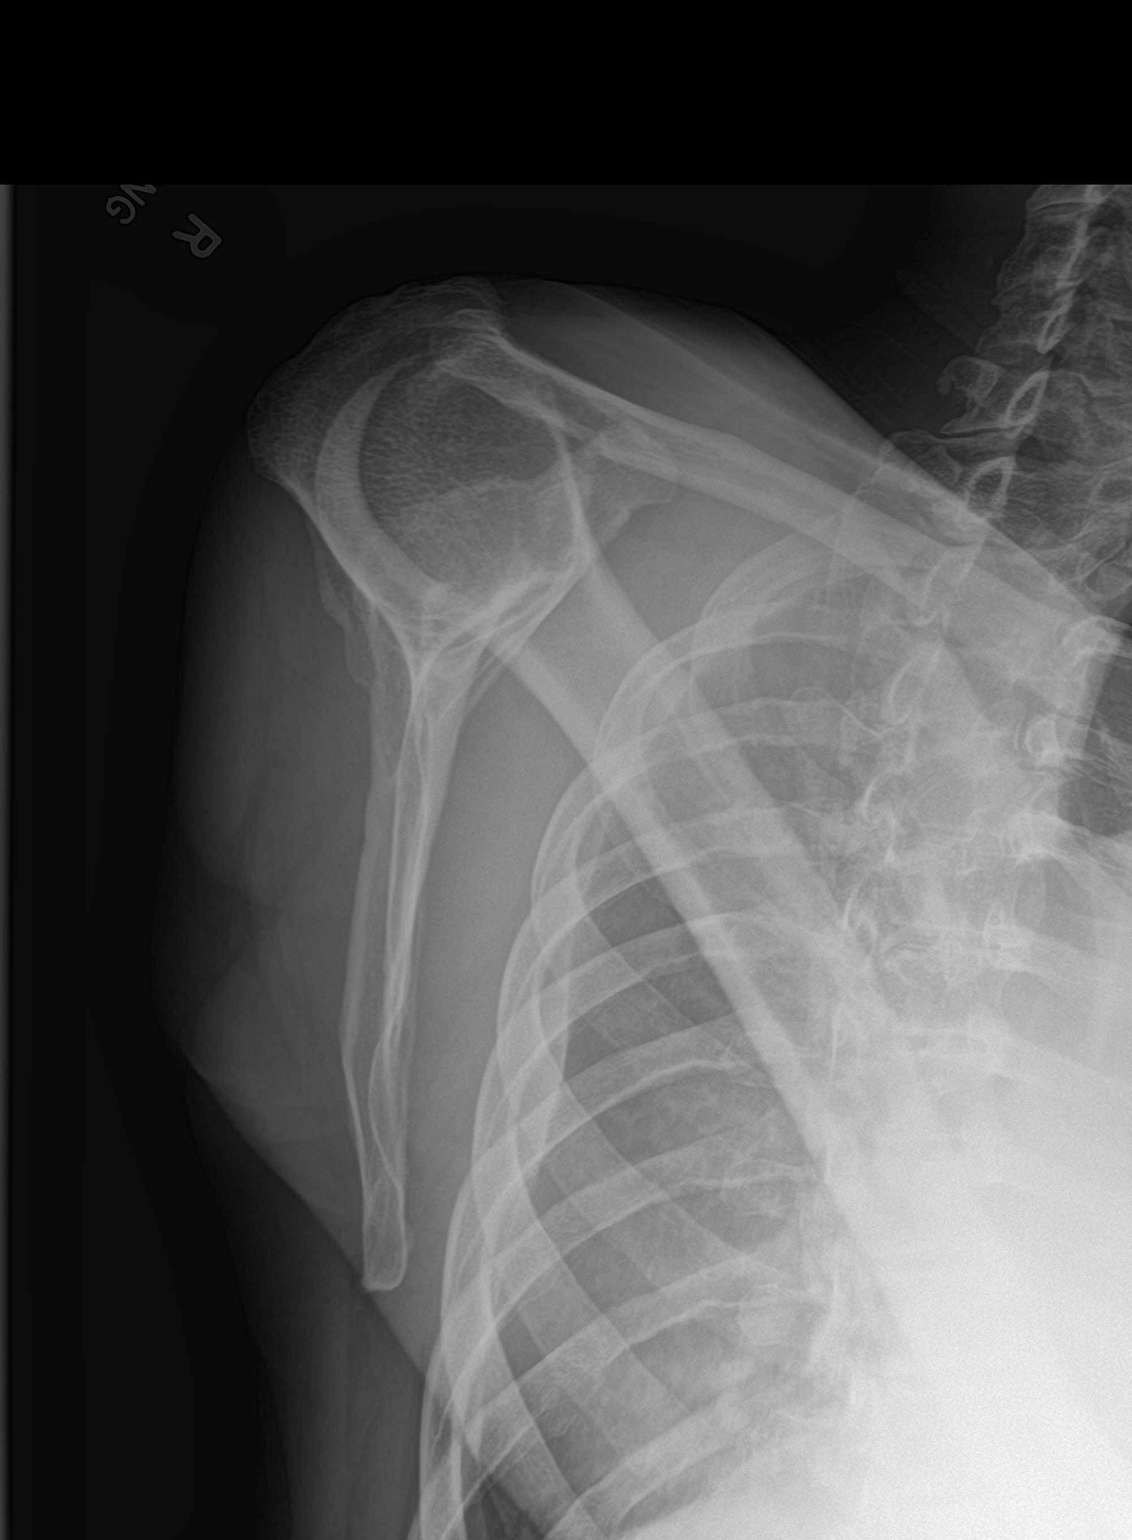

[shoulder axial]
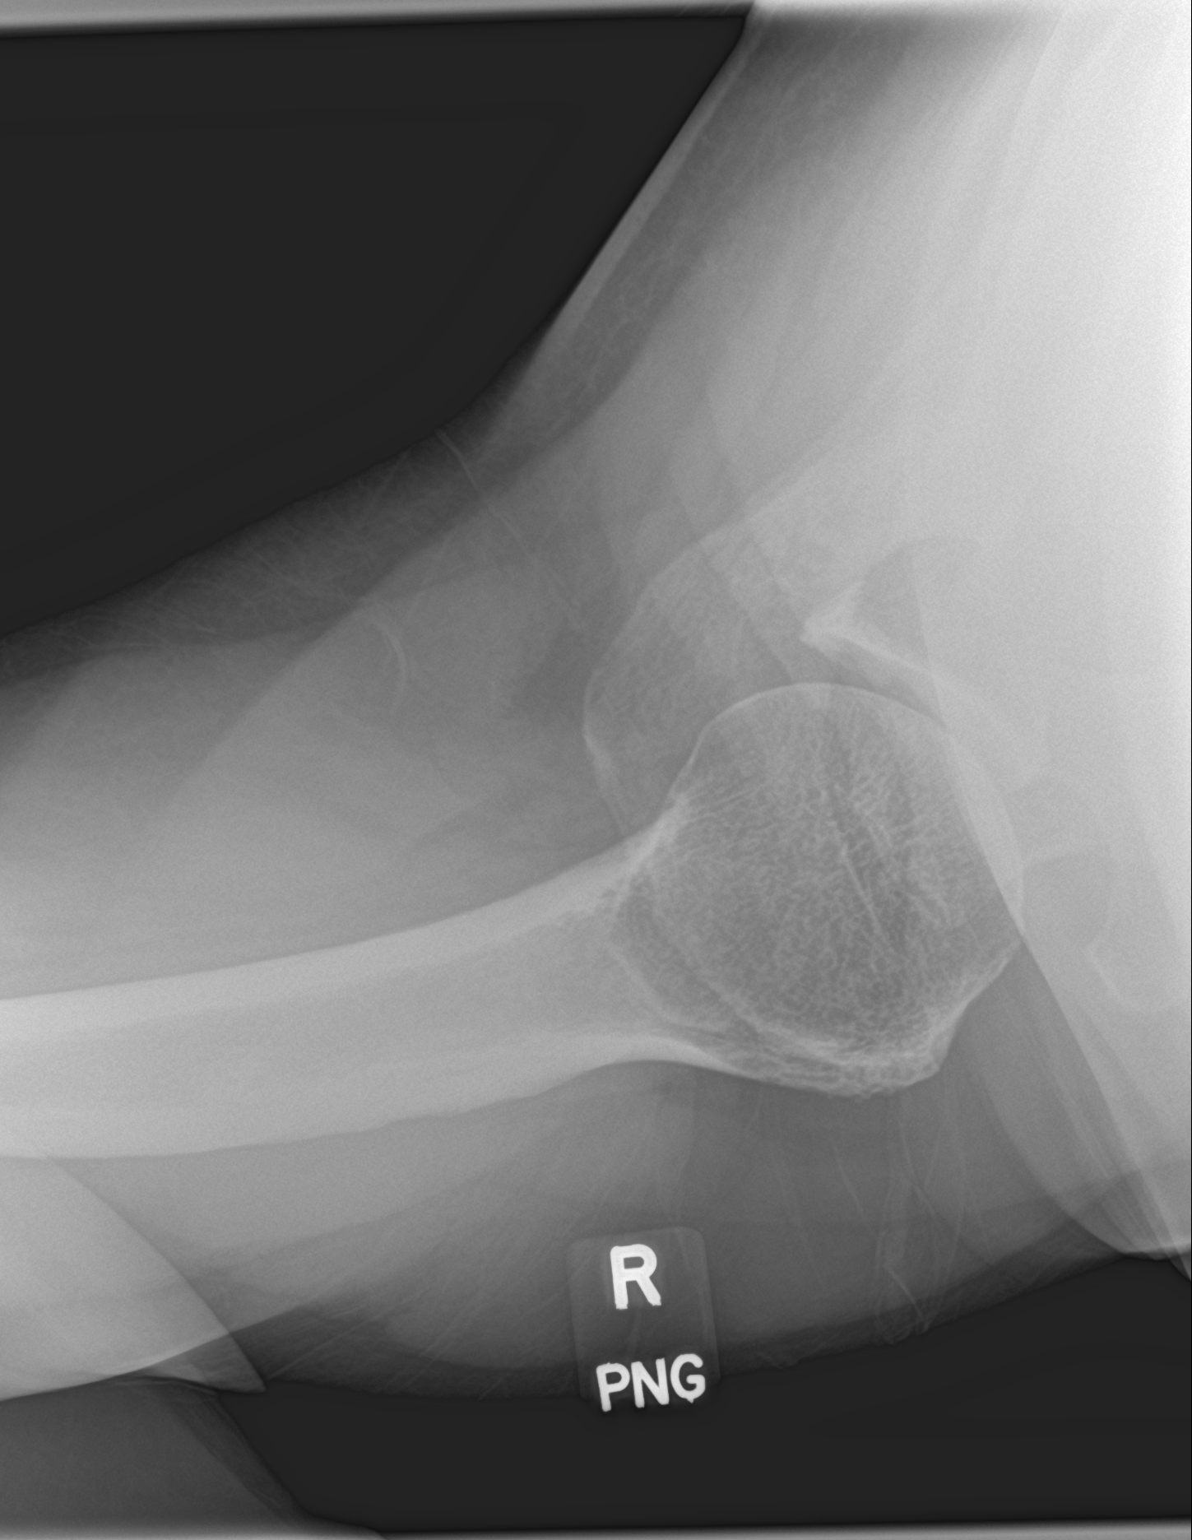

[3 of 3 positions shown; findings below may reference images not displayed]

FINDINGS: Mild to moderate right acromioclavicular joint degenerative changes.

Spur superolateral aspect humeral head probably related to
impingement against the undersurface of the acromion with motion.

No fracture or dislocation.

Visualized lungs clear.
IMPRESSION: 1. Mild to moderate right acromioclavicular joint degenerative
changes.
2. Spur superolateral aspect humeral head probably related to
impingement against the undersurface of the acromion with motion.

## 2019-08-06 ENCOUNTER — Other Ambulatory Visit: Payer: Self-pay | Admitting: Internal Medicine

## 2019-08-06 DIAGNOSIS — M545 Low back pain, unspecified: Secondary | ICD-10-CM

## 2019-08-30 ENCOUNTER — Ambulatory Visit (INDEPENDENT_AMBULATORY_CARE_PROVIDER_SITE_OTHER): Payer: Managed Care, Other (non HMO) | Admitting: Family

## 2019-08-30 ENCOUNTER — Encounter: Payer: Self-pay | Admitting: Family

## 2019-08-30 ENCOUNTER — Other Ambulatory Visit: Payer: Self-pay

## 2019-08-30 VITALS — BP 128/80 | HR 62 | Temp 98.2°F | Ht 71.0 in | Wt 226.0 lb

## 2019-08-30 DIAGNOSIS — M545 Low back pain, unspecified: Secondary | ICD-10-CM

## 2019-08-30 MED ORDER — MELOXICAM 15 MG PO TABS: 15.0000 mg | ORAL_TABLET | Freq: Every day | ORAL | 0 refills | Status: DC

## 2019-08-30 NOTE — Progress Notes (Signed)
John Hogan. is a 69 y.o. male with the following history as recorded in Epic[facility]:  Patient Active Problem List   Diagnosis Date Noted  . Rotator cuff arthropathy of both shoulders 05/29/2019  . Bilateral acromioclavicular joint arthritis 05/29/2019  . Vitamin D deficiency 09/06/2017  . CKD (chronic kidney disease) stage 3, GFR 30-59 ml/min 08/07/2014  . Primary osteoarthritis of left shoulder 06/09/2014  . Insomnia 02/14/2014  . Erectile dysfunction 02/27/2013  . Papular atopic dermatitis 05/16/2012  . Gout 02/16/2012  . Routine general medical examination at a health care facility 02/16/2012  . Hyperlipidemia with target LDL less than 100 02/16/2012  . Aortic regurgitation 02/16/2012  . Colon polyps 02/16/2012  . Essential hypertension, benign 03/07/2009  . Hypertensive heart disease without heart failure 03/07/2009  . COMBINED HEART FAILURE, CHRONIC 03/07/2009    Current Outpatient Medications  Medication Sig Dispense Refill  . allopurinol (ZYLOPRIM) 300 MG tablet TAKE 1 TABLET BY MOUTH EVERY DAY 90 tablet 1  . amLODipine (NORVASC) 10 MG tablet TAKE 1 TABLET BY MOUTH EVERY DAY 90 tablet 1  . colchicine 0.6 MG tablet Take 1 tablet (0.6 mg total) by mouth daily. When having flare 5 tablet 2  . [facility] D3 50 MCG (2000 UT) CAPS TAKE 1 CAPSULE BY MOUTH EVERY DAY 90 capsule 1  . cyclobenzaprine (FLEXERIL) 10 MG tablet TAKE 1 TABLET BY MOUTH THREE TIMES A DAY AS NEEDED FOR MUSCLE SPASMS 90 tablet 1  . losartan (COZAAR) 25 MG tablet TAKE 2 TABLETS BY MOUTH EVERY DAY 180 tablet 1  . pravastatin (PRAVACHOL) 20 MG tablet TAKE 1 TABLET BY MOUTH EVERY DAY 90 tablet 1  . sildenafil (REVATIO) 20 MG tablet TAKE 4 TABLETS BY MOUTH DAILY AS NEEDED. 360 tablet 0  . SIMBRINZA 1-0.2 % SUSP INSTILL 1 DROP INTO BOTH EYES TWICE A DAY  3  . timolol (TIMOPTIC) 0.5 % ophthalmic solution PUT 1 DROP IN BOTH EYES TWICE A DAY  3  . Timolol Maleate 0.5 % (DAILY) SOLN Apply 1 drop to eye 2 (two) times daily.     . Travoprost, BAK Free, (TRAVATAN) 0.004 % SOLN ophthalmic solution Place 1 drop into both eyes at bedtime.     . triamcinolone cream (KENALOG) 0.1 % APPLY TO AFFECTED AREA TWICE A DAY 30 g 0  . zolpidem (AMBIEN) 5 MG tablet TAKE 1 TABLET BY MOUTH EVERY DAY AT BEDTIME AS NEEDED 30 tablet 2  . meloxicam (MOBIC) 15 MG tablet Take 1 tablet (15 mg total) by mouth daily. 30 tablet 0   No current facility-administered medications for this visit.    Allergies: Patient has no known allergies.  Past Medical History:  Diagnosis Date  . AI (aortic insufficiency)   . Chronic kidney disease   . Glaucoma   . Gout   . H/O exercise stress test    Myoview 01/03/12: No scar or ischemia, EF 53%  . Hyperlipidemia   . Hypertension   . Hypertensive cardiomyopathy ([place])    EF previously 35%;  echo 12/2012 EF 55%, mild AI, ascending aorta mildly dilated, mild LAE    Past Surgical History:  Procedure Laterality Date  . COLONOSCOPY    . CYST REMOVAL HAND    . HERNIA REPAIR     Umbilical  . POLYPECTOMY    . WRIST SURGERY     right    Family History  Problem Relation Age of Onset  . Hypertension Father   . Colon cancer Father 52  .  Hyperlipidemia Father   . Heart disease Father   . Diabetes Father   . Stroke Mother        deceased  . Colon cancer Mother 48  . Arthritis Mother   . Hyperlipidemia Mother   . Hypertension Mother   . Diabetes Mother   . Colon polyps Sister   . Colon polyps Brother   . COPD Neg Hx   . Alcohol abuse Neg Hx   . Kidney disease Neg Hx   . Esophageal cancer Neg Hx   . Rectal cancer Neg Hx   . Stomach cancer Neg Hx     Social History   Tobacco Use  . Smoking status: Former Smoker    Packs/day: 1.00    Years: 2.00    Pack years: 2.00    Types: Cigarettes    Quit date: 01/10/1975    Years since quitting: 44.6  . Smokeless tobacco: Never Used  . Tobacco comment: Only smoked two years  Substance Use Topics  . Alcohol use: No    Subjective:  Patient presents  with concerns for 4 days of low back pain. Has been feeling "bad muscle spasms"- used Flexeril with some benefit; no numbness or tingling; feeling better today; no changes in bowel or bladder habits;    Objective:  Vitals:   08/30/19 0930  BP: 128/80  Pulse: 62  Temp: 98.2 F (36.8 C)  TempSrc: Oral  SpO2: 99%  Weight: 226 lb (102.5 kg)  Height: 5\' 11"  (1.803 m)    General: Well developed, well nourished, in no acute distress  Skin : Warm and dry.  Head: Normocephalic and atraumatic  Lungs: Respirations unlabored;  Musculoskeletal: No deformities; no active joint inflammation  Vessels: Symmetric bilaterally  Neurologic: Alert and oriented; speech intact; face symmetrical; moves all extremities well; CNII-XII intact without focal deficit   Assessment:  1. Acute right-sided low back pain without sciatica     Plan:  Add Mobic 15 mg daily which he has tolerated in the past; continue Flexeril; apply heat and rest; follow-up worse, no better.   This visit occurred during the SARS-CoV-2 public health emergency.  Safety protocols were in place, including screening questions prior to the visit, additional usage of staff PPE, and extensive cleaning of exam room while observing appropriate contact time as indicated for disinfecting solutions.     No follow-ups on file.  No orders of the defined types were placed in this encounter.   Requested Prescriptions   Signed Prescriptions Disp Refills  . meloxicam (MOBIC) 15 MG tablet 30 tablet 0    Sig: Take 1 tablet (15 mg total) by mouth daily.

## 2019-09-05 ENCOUNTER — Other Ambulatory Visit: Payer: Self-pay

## 2019-09-05 DIAGNOSIS — M1000 Idiopathic gout, unspecified site: Secondary | ICD-10-CM

## 2019-09-05 MED ORDER — ALLOPURINOL 300 MG PO TABS: 300.0000 mg | ORAL_TABLET | Freq: Every day | ORAL | 0 refills | Status: DC

## 2019-09-07 ENCOUNTER — Other Ambulatory Visit: Payer: Self-pay | Admitting: Internal Medicine

## 2019-09-07 DIAGNOSIS — R21 Rash and other nonspecific skin eruption: Secondary | ICD-10-CM

## 2019-09-07 DIAGNOSIS — M25511 Pain in right shoulder: Secondary | ICD-10-CM

## 2019-09-07 DIAGNOSIS — I5042 Chronic combined systolic (congestive) and diastolic (congestive) heart failure: Secondary | ICD-10-CM

## 2019-09-07 DIAGNOSIS — I10 Essential (primary) hypertension: Secondary | ICD-10-CM

## 2019-09-20 ENCOUNTER — Other Ambulatory Visit: Payer: Self-pay | Admitting: Internal Medicine

## 2019-09-20 DIAGNOSIS — M1000 Idiopathic gout, unspecified site: Secondary | ICD-10-CM

## 2019-09-20 DIAGNOSIS — R21 Rash and other nonspecific skin eruption: Secondary | ICD-10-CM

## 2019-09-25 ENCOUNTER — Other Ambulatory Visit: Payer: Self-pay | Admitting: Internal Medicine

## 2019-09-25 ENCOUNTER — Telehealth: Payer: Self-pay | Admitting: Internal Medicine

## 2019-09-25 DIAGNOSIS — E785 Hyperlipidemia, unspecified: Secondary | ICD-10-CM

## 2019-09-25 DIAGNOSIS — F5104 Psychophysiologic insomnia: Secondary | ICD-10-CM

## 2019-09-25 MED ORDER — ZOLPIDEM TARTRATE 5 MG PO TABS: ORAL_TABLET | ORAL | 2 refills | Status: DC

## 2019-09-25 NOTE — Telephone Encounter (Signed)
   1.Medication Requested: zolpidem (AMBIEN) 5 MG tablet  2. Pharmacy (Name, [address], [place]): [id number] - [place], [place]  3. On Med List: yes  4. Last Visit with PCP: 06/13/19  5. Next visit date with PCP: 10/02/19   Agent: Please be advised that RX refills may take up to 3 business days. We ask that you follow-up with your pharmacy.

## 2019-10-02 ENCOUNTER — Encounter: Payer: Self-pay | Admitting: Internal Medicine

## 2019-10-02 ENCOUNTER — Ambulatory Visit (INDEPENDENT_AMBULATORY_CARE_PROVIDER_SITE_OTHER): Payer: Managed Care, Other (non HMO) | Admitting: Internal Medicine

## 2019-10-02 ENCOUNTER — Other Ambulatory Visit: Payer: Self-pay

## 2019-10-02 VITALS — BP 156/94 | HR 70 | Temp 98.5°F | Resp 16 | Ht 71.0 in | Wt 233.0 lb

## 2019-10-02 DIAGNOSIS — I351 Nonrheumatic aortic (valve) insufficiency: Secondary | ICD-10-CM

## 2019-10-02 DIAGNOSIS — I5189 Other ill-defined heart diseases: Secondary | ICD-10-CM

## 2019-10-02 DIAGNOSIS — Z0000 Encounter for general adult medical examination without abnormal findings: Secondary | ICD-10-CM | POA: Diagnosis not present

## 2019-10-02 DIAGNOSIS — I10 Essential (primary) hypertension: Secondary | ICD-10-CM | POA: Diagnosis not present

## 2019-10-02 DIAGNOSIS — M1000 Idiopathic gout, unspecified site: Secondary | ICD-10-CM

## 2019-10-02 DIAGNOSIS — E559 Vitamin D deficiency, unspecified: Secondary | ICD-10-CM | POA: Diagnosis not present

## 2019-10-02 DIAGNOSIS — N1831 Chronic kidney disease, stage 3a: Secondary | ICD-10-CM | POA: Diagnosis not present

## 2019-10-02 DIAGNOSIS — N522 Drug-induced erectile dysfunction: Secondary | ICD-10-CM

## 2019-10-02 DIAGNOSIS — I519 Heart disease, unspecified: Secondary | ICD-10-CM

## 2019-10-02 DIAGNOSIS — N400 Enlarged prostate without lower urinary tract symptoms: Secondary | ICD-10-CM

## 2019-10-02 DIAGNOSIS — E785 Hyperlipidemia, unspecified: Secondary | ICD-10-CM

## 2019-10-02 DIAGNOSIS — F5104 Psychophysiologic insomnia: Secondary | ICD-10-CM

## 2019-10-02 DIAGNOSIS — I119 Hypertensive heart disease without heart failure: Secondary | ICD-10-CM

## 2019-10-02 DIAGNOSIS — I5042 Chronic combined systolic (congestive) and diastolic (congestive) heart failure: Secondary | ICD-10-CM

## 2019-10-02 LAB — URINALYSIS, ROUTINE W REFLEX MICROSCOPIC
Bilirubin Urine: NEGATIVE
Hgb urine dipstick: NEGATIVE
Ketones, ur: NEGATIVE
Leukocytes,Ua: NEGATIVE
Nitrite: NEGATIVE
RBC / HPF: NONE SEEN (ref 0–?)
Specific Gravity, Urine: 1.02 (ref 1.000–1.030)
Total Protein, Urine: NEGATIVE
Urine Glucose: NEGATIVE
Urobilinogen, UA: 1 (ref 0.0–1.0)
WBC, UA: NONE SEEN (ref 0–?)
pH: 6.5 (ref 5.0–8.0)

## 2019-10-02 LAB — BASIC METABOLIC PANEL
BUN: 20 mg/dL (ref 6–23)
CO2: 29 mEq/L (ref 19–32)
Calcium: 9.4 mg/dL (ref 8.4–10.5)
Chloride: 106 mEq/L (ref 96–112)
Creatinine, Ser: 1.63 mg/dL — ABNORMAL HIGH (ref 0.40–1.50)
GFR: 50.87 mL/min — ABNORMAL LOW (ref >60.00)
Glucose, Bld: 93 mg/dL (ref 70–99)
Potassium: 3.9 mEq/L (ref 3.5–5.1)
Sodium: 141 mEq/L (ref 135–145)

## 2019-10-02 LAB — LIPID PANEL
Cholesterol: 159 mg/dL (ref 0–200)
HDL: 64.8 mg/dL (ref >39.00)
LDL Cholesterol: 83 mg/dL (ref 0–99)
NonHDL: 93.79
Total CHOL/HDL Ratio: 2
Triglycerides: 55 mg/dL (ref 0.0–149.0)
VLDL: 11 mg/dL (ref 0.0–40.0)

## 2019-10-02 LAB — BRAIN NATRIURETIC PEPTIDE: Pro B Natriuretic peptide (BNP): 31 pg/mL (ref 0.0–100.0)

## 2019-10-02 LAB — HEPATIC FUNCTION PANEL
ALT: 11 U/L (ref 0–53)
AST: 13 U/L (ref 0–37)
Albumin: 4.4 g/dL (ref 3.5–5.2)
Alkaline Phosphatase: 79 U/L (ref 39–117)
Bilirubin, Direct: 0.2 mg/dL (ref 0.0–0.3)
Total Bilirubin: 0.9 mg/dL (ref 0.2–1.2)
Total Protein: 7.4 g/dL (ref 6.0–8.3)

## 2019-10-02 LAB — TSH: TSH: 1.94 u[IU]/mL (ref 0.35–4.50)

## 2019-10-02 LAB — URIC ACID: Uric Acid, Serum: 4.8 mg/dL (ref 4.0–7.8)

## 2019-10-02 LAB — VITAMIN D 25 HYDROXY (VIT D DEFICIENCY, FRACTURES): VITD: 58.68 ng/mL (ref 30.00–100.00)

## 2019-10-02 LAB — PSA: PSA: 1.89 ng/mL (ref 0.10–4.00)

## 2019-10-02 MED ORDER — TORSEMIDE 20 MG PO TABS: 20.0000 mg | ORAL_TABLET | Freq: Every day | ORAL | 0 refills | Status: DC

## 2019-10-02 MED ORDER — PRAVASTATIN SODIUM 20 MG PO TABS: 20.0000 mg | ORAL_TABLET | Freq: Every day | ORAL | 1 refills | Status: DC

## 2019-10-02 MED ORDER — SILDENAFIL CITRATE 20 MG PO TABS: ORAL_TABLET | ORAL | 0 refills | Status: DC

## 2019-10-02 MED ORDER — LOSARTAN POTASSIUM 25 MG PO TABS: 50.0000 mg | ORAL_TABLET | Freq: Every day | ORAL | 1 refills | Status: DC

## 2019-10-02 MED ORDER — ZOLPIDEM TARTRATE 5 MG PO TABS: ORAL_TABLET | ORAL | 5 refills | Status: DC

## 2019-10-02 MED ORDER — ALLOPURINOL 300 MG PO TABS: 300.0000 mg | ORAL_TABLET | Freq: Every day | ORAL | 1 refills | Status: DC

## 2019-10-02 MED ORDER — CVS D3 50 MCG (2000 UT) PO CAPS: 2000.0000 [IU] | ORAL_CAPSULE | Freq: Every day | ORAL | 1 refills | Status: DC

## 2019-10-02 NOTE — Patient Instructions (Signed)

## 2019-10-02 NOTE — Progress Notes (Addendum)
Subjective:  Patient ID: Duane Earnshaw., male    DOB: [birth year]  Age: 69 y.o. MRN: [id number]  CC: Annual Exam and Hypertension  This visit occurred during the SARS-CoV-2 public health emergency.  Safety protocols were in place, including screening questions prior to the visit, additional usage of staff PPE, and extensive cleaning of exam room while observing appropriate contact time as indicated for disinfecting solutions.    HPI Aryeh Butterfield. presents for a CPX.  He complains of weight gain and lower extremity edema.  He is very active and denies any recent episodes of chest pain, shortness of breath, palpitations, diaphoresis, dizziness, lightheadedness, or fatigue.  Outpatient Medications Prior to Visit  Medication Sig Dispense Refill  . colchicine 0.6 MG tablet Take 1 tablet (0.6 mg total) by mouth daily. When having flare 5 tablet 2  . SIMBRINZA 1-0.2 % SUSP INSTILL 1 DROP INTO BOTH EYES TWICE A DAY  3  . timolol (TIMOPTIC) 0.5 % ophthalmic solution PUT 1 DROP IN BOTH EYES TWICE A DAY  3  . Timolol Maleate 0.5 % (DAILY) SOLN Apply 1 drop to eye 2 (two) times daily.    . Travoprost, BAK Free, (TRAVATAN) 0.004 % SOLN ophthalmic solution Place 1 drop into both eyes at bedtime.     Marland Kitchen allopurinol (ZYLOPRIM) 300 MG tablet TAKE 1 TABLET BY MOUTH EVERY DAY 90 tablet 1  . amLODipine (NORVASC) 10 MG tablet TAKE 1 TABLET BY MOUTH EVERY DAY 90 tablet 1  . [facility] D3 50 MCG (2000 UT) CAPS TAKE 1 CAPSULE BY MOUTH EVERY DAY 90 capsule 1  . losartan (COZAAR) 25 MG tablet TAKE 2 TABLETS BY MOUTH EVERY DAY 180 tablet 1  . pravastatin (PRAVACHOL) 20 MG tablet TAKE 1 TABLET BY MOUTH EVERY DAY 90 tablet 1  . sildenafil (REVATIO) 20 MG tablet TAKE 4 TABLETS BY MOUTH DAILY AS NEEDED. 360 tablet 0  . zolpidem (AMBIEN) 5 MG tablet TAKE 1 TABLET BY MOUTH EVERY DAY AT BEDTIME AS NEEDED 30 tablet 2  . cyclobenzaprine (FLEXERIL) 10 MG tablet TAKE 1 TABLET BY MOUTH THREE TIMES A DAY AS NEEDED FOR MUSCLE  SPASMS 90 tablet 1  . meloxicam (MOBIC) 15 MG tablet TAKE 1 TABLET BY MOUTH EVERY DAY 90 tablet 0  . triamcinolone cream (KENALOG) 0.1 % APPLY TO AFFECTED AREA TWICE A DAY 30 g 1   No facility-administered medications prior to visit.    ROS Review of Systems  Constitutional: Positive for unexpected weight change. Negative for chills, diaphoresis and fatigue.  HENT: Negative.   Eyes: Negative for visual disturbance.  Respiratory: Negative for cough, chest tightness, shortness of breath and wheezing.   Cardiovascular: Positive for leg swelling. Negative for chest pain and palpitations.  Gastrointestinal: Negative for abdominal pain, constipation, diarrhea, nausea and vomiting.  Endocrine: Negative.   Genitourinary: Negative.  Negative for difficulty urinating, scrotal swelling, testicular pain and urgency.       +ED  Musculoskeletal: Negative.  Negative for arthralgias and myalgias.  Skin: Negative for pallor.  Neurological: Negative.  Negative for dizziness, weakness, light-headedness and headaches.  Hematological: Negative for adenopathy. Does not bruise/bleed easily.  Psychiatric/Behavioral: Positive for sleep disturbance. Negative for behavioral problems, confusion, decreased concentration, dysphoric mood and suicidal ideas. The patient is not nervous/anxious.     Objective:  BP (!) 156/94 (BP Location: Left Arm, Patient Position: Sitting, Cuff Size: Large)   Pulse 70   Temp 98.5 F (36.9 C) (Oral)   Resp 16  Ht 5\' 11"  (1.803 m)   Wt 233 lb (105.7 kg)   SpO2 97%   BMI 32.50 kg/m   BP Readings from Last 3 Encounters:  10/02/19 (!) 156/94  08/30/19 128/80  06/13/19 122/76    Wt Readings from Last 3 Encounters:  10/02/19 233 lb (105.7 kg)  08/30/19 226 lb (102.5 kg)  06/13/19 226 lb 12 oz (102.9 kg)    Physical Exam Vitals reviewed.  Constitutional:      Appearance: He is obese.  HENT:     Nose: Nose normal.     Mouth/Throat:     Mouth: Mucous membranes are  moist.  Eyes:     General: No scleral icterus.    Conjunctiva/sclera: Conjunctivae normal.  Cardiovascular:     Rate and Rhythm: Normal rate and regular rhythm.     Heart sounds: Murmur present. Diastolic murmur present with a grade of 1/4. No gallop.      Comments: EKG - Sinus bradycardia LAE, LVH No change compared to the prior EKG Pulmonary:     Effort: Pulmonary effort is normal.     Breath sounds: No stridor. No wheezing, rhonchi or rales.  Abdominal:     General: Abdomen is protuberant. Bowel sounds are normal. There is no distension.     Palpations: Abdomen is soft. There is no hepatomegaly, splenomegaly or mass.     Tenderness: There is no abdominal tenderness.     Hernia: No hernia is present. There is no hernia in the left inguinal area or right inguinal area.  Genitourinary:    Pubic Area: No rash.      Penis: Uncircumcised. No phimosis, paraphimosis, hypospadias, erythema, tenderness, discharge, swelling or lesions.      Testes: Normal.        Right: Mass, tenderness or swelling not present.        Left: Mass, tenderness or swelling not present.     Epididymis:     Right: Normal. Not inflamed or enlarged. No mass or tenderness.     Left: Not inflamed or enlarged. No mass or tenderness.     Prostate: Enlarged (1+ smooth symm BPH). Not tender and no nodules present.     Rectum: Guaiac result negative. Internal hemorrhoid present. No mass, tenderness, anal fissure or external hemorrhoid. Normal anal tone.  Musculoskeletal:     Cervical back: Neck supple.     Right lower leg: 2+ Pitting Edema present.     Left lower leg: 2+ Pitting Edema present.  Lymphadenopathy:     Cervical: No cervical adenopathy.     Lower Body: No right inguinal adenopathy. No left inguinal adenopathy.  Skin:    General: Skin is warm.     Coloration: Skin is not pale.     Findings: No lesion or rash.  Neurological:     General: No focal deficit present.     Mental Status: He is oriented to  person, place, and time. Mental status is at baseline.  Psychiatric:        Mood and Affect: Mood normal.        Behavior: Behavior normal.     Lab Results  Component Value Date   WBC 4.2 06/13/2019   HGB 13.7 06/13/2019   HCT 41.3 06/13/2019   PLT 180.0 06/13/2019   GLUCOSE 93 10/02/2019   CHOL 159 10/02/2019   TRIG 55.0 10/02/2019   HDL 64.80 10/02/2019   LDLDIRECT 140.7 02/27/2013   LDLCALC 83 10/02/2019   ALT 11 10/02/2019  AST 13 10/02/2019   NA 141 10/02/2019   K 3.9 10/02/2019   CL 106 10/02/2019   CREATININE 1.63 (H) 10/02/2019   BUN 20 10/02/2019   CO2 29 10/02/2019   TSH 1.94 10/02/2019   PSA 1.89 10/02/2019    No results found.  Assessment & Plan:   Jermichael was seen today for annual exam and hypertension.  Diagnoses and all orders for this visit:  Stage 3a chronic kidney disease- His renal function is stable.  I have asked him to avoid nephrotoxic agents.  Will try to get better control of his blood pressure. -     Basic metabolic panel; Future -     Urinalysis, Routine w reflex microscopic; Future -     Urinalysis, Routine w reflex microscopic -     Basic metabolic panel  Idiopathic gout, unspecified chronicity, unspecified site- He has achieved his uric acid goal and is doing well on allopurinol. -     Basic metabolic panel; Future -     Uric acid; Future -     Uric acid -     Basic metabolic panel -     allopurinol (ZYLOPRIM) 300 MG tablet; Take 1 tablet (300 mg total) by mouth daily.  Routine general medical examination at a health care facility- Exam completed, labs reviewed, vaccines reviewed, cancer screenings are up-to-date, patient education was given.  Vitamin D deficiency -     VITAMIN D 25 Hydroxy (Vit-D Deficiency, Fractures); Future -     VITAMIN D 25 Hydroxy (Vit-D Deficiency, Fractures) -     Cholecalciferol (CVS D3) 50 MCG (2000 UT) CAPS; Take 1 capsule (2,000 Units total) by mouth daily.  Nonrheumatic aortic valve insufficiency-  He is symptomatic with this.  His EKG is unchanged.  I recommended that he see cardiology. -     Ambulatory referral to Cardiology  Essential hypertension, benign- His blood pressure is not adequately well controlled and he has significant lower extremity edema.  I have asked him to stop taking amlodipine and switch to a loop diuretic. -     Basic metabolic panel; Future -     TSH; Future -     Urinalysis, Routine w reflex microscopic; Future -     torsemide (DEMADEX) 20 MG tablet; Take 1 tablet (20 mg total) by mouth daily. -     EKG 12-Lead -     Urinalysis, Routine w reflex microscopic -     TSH -     Basic metabolic panel -     losartan (COZAAR) 25 MG tablet; Take 2 tablets (50 mg total) by mouth daily.  COMBINED HEART FAILURE, CHRONIC -     Brain natriuretic peptide; Future -     torsemide (DEMADEX) 20 MG tablet; Take 1 tablet (20 mg total) by mouth daily. -     Ambulatory referral to Cardiology -     Brain natriuretic peptide -     losartan (COZAAR) 25 MG tablet; Take 2 tablets (50 mg total) by mouth daily.  Hyperlipidemia with target LDL less than 100- He has achieved his LDL goal is doing well on the statin. -     Lipid panel; Future -     TSH; Future -     Hepatic function panel; Future -     Hepatic function panel -     TSH -     Lipid panel -     pravastatin (PRAVACHOL) 20 MG tablet; Take 1 tablet (20 mg total)  by mouth daily.  Benign prostatic hyperplasia without lower urinary tract symptoms- His PSA is normal which is a reassuring sign that he does not have prostate cancer.  He has no symptoms that need to be treated. -     PSA; Future -     PSA  Drug-induced erectile dysfunction -     sildenafil (REVATIO) 20 MG tablet; TAKE 4 TABLETS BY MOUTH DAILY AS NEEDED.  Psychophysiological insomnia -     zolpidem (AMBIEN) 5 MG tablet; TAKE 1 TABLET BY MOUTH EVERY DAY AT BEDTIME AS NEEDED   I have discontinued Donzetta Starch Jr.'s cyclobenzaprine, meloxicam, amLODipine, and  triamcinolone cream. I have also changed his allopurinol, CVS D3, losartan, and pravastatin. Additionally, I am having him start on torsemide. Lastly, I am having him maintain his Travoprost (BAK Free), timolol, Simbrinza, colchicine, Timolol Maleate, sildenafil, and zolpidem.  Meds ordered this encounter  Medications  . torsemide (DEMADEX) 20 MG tablet    Sig: Take 1 tablet (20 mg total) by mouth daily.    Dispense:  90 tablet    Refill:  0  . sildenafil (REVATIO) 20 MG tablet    Sig: TAKE 4 TABLETS BY MOUTH DAILY AS NEEDED.    Dispense:  360 tablet    Refill:  0  . allopurinol (ZYLOPRIM) 300 MG tablet    Sig: Take 1 tablet (300 mg total) by mouth daily.    Dispense:  90 tablet    Refill:  1  . Cholecalciferol (CVS D3) 50 MCG (2000 UT) CAPS    Sig: Take 1 capsule (2,000 Units total) by mouth daily.    Dispense:  90 capsule    Refill:  1  . losartan (COZAAR) 25 MG tablet    Sig: Take 2 tablets (50 mg total) by mouth daily.    Dispense:  180 tablet    Refill:  1  . pravastatin (PRAVACHOL) 20 MG tablet    Sig: Take 1 tablet (20 mg total) by mouth daily.    Dispense:  90 tablet    Refill:  1  . zolpidem (AMBIEN) 5 MG tablet    Sig: TAKE 1 TABLET BY MOUTH EVERY DAY AT BEDTIME AS NEEDED    Dispense:  30 tablet    Refill:  5    Not to exceed 2 additional fills before 08/02/2019   In addition to time spent on CPE, I spent 50 minutes in preparing to see the patient by review of recent labs, imaging and procedures, obtaining and reviewing separately obtained history, communicating with the patient and family or caregiver, ordering medications, tests or procedures, and documenting clinical information in the EHR including the differential Dx, treatment, and any further evaluation and other management of   1. Stage 3a chronic kidney disease 2. Idiopathic gout, unspecified chronicity, unspecified site 3. Vitamin D deficiency 4. Nonrheumatic aortic valve insufficiency 5. Essential  hypertension, benign 6. COMBINED HEART FAILURE, CHRONIC 7. Hyperlipidemia with target LDL less than 100 8. Benign prostatic hyperplasia without lower urinary tract symptom 9. Drug-induced erectile dysfunction 10. Psychophysiological insomnia   Follow-up: Return in about 3 months (around 01/01/2020).  Scarlette Calico, MD

## 2019-10-06 DIAGNOSIS — R001 Bradycardia, unspecified: Secondary | ICD-10-CM | POA: Insufficient documentation

## 2019-10-06 DIAGNOSIS — Z7189 Other specified counseling: Secondary | ICD-10-CM | POA: Insufficient documentation

## 2019-10-06 NOTE — Progress Notes (Signed)
Cardiology Office Note   Date:  10/08/2019   ID:  John Mayer., DOB [birth year], MRN [id number]  PCP:  Janith Lima, MD  Cardiologist:   No primary care provider on file.   Chief Complaint  Patient presents with  . Leg Swelling      History of Present Illness: John Hogan. is a 69 y.o. male who presents for followup of aortic insufficiency. He has had previously reduced ejection fraction but it has been improved at followup. The last echo was done in Feb 2018 and had a preserved EF and mild AI.  Since I last saw him he has done well.  He is getting married soon.  He has been doing some active walking.  He does his grocery shopping.  The patient denies any new symptoms such as chest discomfort, neck or arm discomfort. There has been no new shortness of breath, PND or orthopnea. There have been no reported palpitations, presyncope or syncope.   Of note he did have some lower extremity swelling and his blood pressure was not well controlled.  I reviewed his primary care office notes and the patient was switched off of Norvasc and torsemide was added for blood pressure control and improvement of his swelling.  He says his legs are less swollen.   Past Medical History:  Diagnosis Date  . AI (aortic insufficiency)   . Chronic kidney disease   . Glaucoma   . Gout   . H/O exercise stress test    Myoview 01/03/12: No scar or ischemia, EF 53%  . Hyperlipidemia   . Hypertension   . Hypertensive cardiomyopathy ([place])    EF previously 35%;  echo 12/2012 EF 55%, mild AI, ascending aorta mildly dilated, mild LAE    Past Surgical History:  Procedure Laterality Date  . COLONOSCOPY    . CYST REMOVAL HAND    . HERNIA REPAIR     Umbilical  . POLYPECTOMY    . WRIST SURGERY     right     Current Outpatient Medications  Medication Sig Dispense Refill  . allopurinol (ZYLOPRIM) 300 MG tablet Take 1 tablet (300 mg total) by mouth daily. 90 tablet 1  . Cholecalciferol (CVS D3) 50  MCG (2000 UT) CAPS Take 1 capsule (2,000 Units total) by mouth daily. 90 capsule 1  . colchicine 0.6 MG tablet Take 1 tablet (0.6 mg total) by mouth daily. When having flare 5 tablet 2  . losartan (COZAAR) 25 MG tablet Take 2 tablets (50 mg total) by mouth daily. 180 tablet 1  . pravastatin (PRAVACHOL) 20 MG tablet Take 1 tablet (20 mg total) by mouth daily. 90 tablet 1  . sildenafil (REVATIO) 20 MG tablet TAKE 4 TABLETS BY MOUTH DAILY AS NEEDED. 360 tablet 0  . SIMBRINZA 1-0.2 % SUSP INSTILL 1 DROP INTO BOTH EYES TWICE A DAY  3  . timolol (TIMOPTIC) 0.5 % ophthalmic solution PUT 1 DROP IN BOTH EYES TWICE A DAY  3  . Timolol Maleate 0.5 % (DAILY) SOLN Apply 1 drop to eye 2 (two) times daily.    Marland Kitchen torsemide (DEMADEX) 20 MG tablet Take 1 tablet (20 mg total) by mouth daily. 90 tablet 0  . Travoprost, BAK Free, (TRAVATAN) 0.004 % SOLN ophthalmic solution Place 1 drop into both eyes at bedtime.     Marland Kitchen zolpidem (AMBIEN) 5 MG tablet TAKE 1 TABLET BY MOUTH EVERY DAY AT BEDTIME AS NEEDED 30 tablet 5   No current facility-administered  medications for this visit.    Allergies:   Patient has no known allergies.   ROS:  Please see the history of present illness.   Otherwise, review of systems are positive for none.   All other systems are reviewed and negative.    PHYSICAL EXAM: VS:  BP (!) 130/92   Pulse 66   Temp 98 F (36.7 C)   Ht 5\' 11"  (1.803 m)   Wt 226 lb 6.4 oz (102.7 kg)   SpO2 94%   BMI 31.58 kg/m  , BMI Body mass index is 31.58 kg/m. GENERAL:  Well appearing NECK:  No jugular venous distention, waveform within normal limits, carotid upstroke brisk and symmetric, no bruits, no thyromegaly LUNGS:  Clear to auscultation bilaterally CHEST:  Unremarkable HEART:  PMI not displaced or sustained,S1 and S2 within normal limits, no S3, no S4, no clicks, no rubs, 2 out of 6 very brief intermittent diastolic murmur heard at the left third intercostal space, no systolic murmurs ABD:  Flat,  positive bowel sounds normal in frequency in pitch, no bruits, no rebound, no guarding, no midline pulsatile mass, no hepatomegaly, no splenomegaly EXT:  2 plus pulses throughout, no edema, no cyanosis no clubbing     EKG:  EKG is not ordered today.    Recent Labs: 06/13/2019: Hemoglobin 13.7; Platelets 180.0 10/02/2019: ALT 11; BUN 20; Creatinine, Ser 1.63; Potassium 3.9; Pro B Natriuretic peptide (BNP) 31.0; Sodium 141; TSH 1.94    Lipid Panel    Component Value Date/Time   CHOL 159 10/02/2019 1052   TRIG 55.0 10/02/2019 1052   HDL 64.80 10/02/2019 1052   CHOLHDL 2 10/02/2019 1052   VLDL 11.0 10/02/2019 1052   LDLCALC 83 10/02/2019 1052   LDLDIRECT 140.7 02/27/2013 1405      Wt Readings from Last 3 Encounters:  10/08/19 226 lb 6.4 oz (102.7 kg)  10/07/19 227 lb (103 kg)  10/02/19 233 lb (105.7 kg)      Other studies Reviewed: Additional studies/ records that were reviewed today include: Labs. Review of the above records demonstrates:  Please see elsewhere in the note.     ASSESSMENT AND PLAN:   Aortic insufficiency - This was mild on echo in 2018.    I will check an echocardiogram as its been 3 years.   HTN - The blood pressure is at target today.  He just had his meds adjusted.  I will take the liberty of checking a basic metabolic profile since his diuretic has been added.  Dyslipidemia -  LDL was 83 with an HDL of 64.8.  No change in therapy.   CKD -   I note that his creatinine was 1.63 most recently on the seventh.  I will repeat this.   Bradycardia  -  He is not bradycardic today.  He has no symptoms.  No change in therapy.   Covid education - He has had both vaccines.  Current medicines are reviewed at length with the patient today.  The patient does not have concerns regarding medicines.  The following changes have been made:  no change  Labs/ tests ordered today include:   Orders Placed This Encounter  Procedures  . Basic metabolic panel   . ECHOCARDIOGRAM COMPLETE     Disposition:   FU with me in one year.     Signed, Minus Breeding, MD  10/08/2019 2:46 PM    [place]

## 2019-10-07 ENCOUNTER — Ambulatory Visit (INDEPENDENT_AMBULATORY_CARE_PROVIDER_SITE_OTHER): Payer: Managed Care, Other (non HMO) | Admitting: Family Medicine

## 2019-10-07 ENCOUNTER — Ambulatory Visit: Payer: Self-pay

## 2019-10-07 ENCOUNTER — Encounter: Payer: Self-pay | Admitting: Family Medicine

## 2019-10-07 ENCOUNTER — Other Ambulatory Visit: Payer: Self-pay

## 2019-10-07 VITALS — BP 130/80 | HR 78 | Ht 71.0 in | Wt 227.0 lb

## 2019-10-07 DIAGNOSIS — M25512 Pain in left shoulder: Secondary | ICD-10-CM | POA: Diagnosis not present

## 2019-10-07 DIAGNOSIS — M25511 Pain in right shoulder: Secondary | ICD-10-CM

## 2019-10-07 DIAGNOSIS — G8929 Other chronic pain: Secondary | ICD-10-CM

## 2019-10-07 NOTE — Progress Notes (Signed)
I, John Hogan, LAT, ATC, am serving as scribe for Dr. Lynne Leader.  John Hogan. is a 69 y.o. male who presents to [place] at [facility] today for f/u of B shoulder pain.  He was last seen by Dr. Tamala Julian on 05/29/19 and had B GHJ injections, was provided a HEP, advised to take tart cherry extract and was prescribed cholchicine.  Since his last visit, pt reports increased pain, L >R, w/ no new MOI.  He notes increased pain over the past 2-3 weeks.  He denies any radiating pain into his B UEs and denies any mechanical symptoms.   Pertinent review of systems: No fevers or chills  Relevant historical information: Hypertension   Exam:  BP 130/80 (BP Location: Left Arm, Patient Position: Sitting, Cuff Size: Large)   Pulse 78   Ht 5\' 11"  (1.803 m)   Wt 227 lb (103 kg)   SpO2 98%   BMI 31.66 kg/m  General: Well Developed, well nourished, and in no acute distress.   MSK:  Right shoulder normal-appearing nontender normal motion.  Strength intact.  Left shoulder normal.  Nontender. Significant weakness to abduction and external rotation.  Intact strength to internal rotation. Pulses capillary fill and sensation intact distal bilateral extremities.    Lab and Radiology Results  Diagnostic Limited MSK Ultrasound of: Left shoulder Subscapularis tendon is intact. Supraspinatus tendon is absent with retraction. High riding humeral head. Infraspinatus tendon is either absent or very thinned out indicating chronic tear. Impression: Chronic rotator cuff tear with retraction of supraspinatus and infraspinatus tendon  Procedure: Real-time Ultrasound Guided Injection of left shoulder glenohumeral joint Device: Philips Affiniti 50G Images permanently stored and available for review in the ultrasound unit. Verbal informed consent obtained.  Discussed risks and benefits of procedure. Warned about infection bleeding damage to structures skin hypopigmentation and fat  atrophy among others. Patient expresses understanding and agreement Time-out conducted.   Noted no overlying erythema, induration, or other signs of local infection.   Skin prepped in a sterile fashion.   Local anesthesia: Topical Ethyl chloride.   With sterile technique and under real time ultrasound guidance:  40 mg of Kenalog and 2 mL of Marcaine injected easily.   Completed without difficulty   Pain immediately resolved suggesting accurate placement of the medication.   Advised to call if fevers/chills, erythema, induration, drainage, or persistent bleeding.   Images permanently stored and available for review in the ultrasound unit.  Impression: Technically successful ultrasound guided injection.     Procedure: Real-time Ultrasound Guided Injection of right shoulder glenohumeral joint Device: Philips Affiniti 50G Images permanently stored and available for review in the ultrasound unit. Verbal informed consent obtained.  Discussed risks and benefits of procedure. Warned about infection bleeding damage to structures skin hypopigmentation and fat atrophy among others. Patient expresses understanding and agreement Time-out conducted.   Noted no overlying erythema, induration, or other signs of local infection.   Skin prepped in a sterile fashion.   Local anesthesia: Topical Ethyl chloride.   With sterile technique and under real time ultrasound guidance:  40 mg of Kenalog and 2 mL of Marcaine injected easily.   Completed without difficulty   Pain immediately resolved suggesting accurate placement of the medication.   Advised to call if fevers/chills, erythema, induration, drainage, or persistent bleeding.   Images permanently stored and available for review in the ultrasound unit.  Impression: Technically successful ultrasound guided injection.        Assessment and  Plan: 69 y.o. male with bilateral shoulder pain. Physical exam and ultrasound examination of left shoulder  suggest chronic old retracted complete rotator cuff tear of supraspinatus and infraspinatus tendon.  Despite that he has great motion.  Plan for injection today.  He had great response to steroid injections both shoulders. Plan for limited physical therapy to work on a bit of strengthening and motion. Recheck back with me as needed.    PDMP not reviewed this encounter. Orders Placed This Encounter  Procedures  . [place] LIMITED JOINT SPACE STRUCTURES UP BILAT(NO LINKED CHARGES)    Order Specific Question:   Reason for Exam (SYMPTOM  OR DIAGNOSIS REQUIRED)    Answer:   B shoulder pain    Order Specific Question:   Preferred imaging location?    Answer:   [facility]  . Ambulatory referral to Physical Therapy    Referral Priority:   Routine    Referral Type:   Physical Medicine    Referral Reason:   Specialty Services Required    Requested Specialty:   Physical Therapy   No orders of the defined types were placed in this encounter.    Discussed warning signs or symptoms. Please see discharge instructions. Patient expresses understanding.   The above documentation has been reviewed and is accurate and complete Lynne Leader

## 2019-10-07 NOTE — Patient Instructions (Addendum)
You had B shoulder injections today.  Call or go to the ER if you develop a large red swollen joint with extreme pain or oozing puss.   Try doing a little PT.  Recheck with me or Dr Tamala Julian as needed in the future.

## 2019-10-08 ENCOUNTER — Encounter: Payer: Self-pay | Admitting: Cardiology

## 2019-10-08 ENCOUNTER — Other Ambulatory Visit: Payer: Self-pay

## 2019-10-08 ENCOUNTER — Ambulatory Visit (INDEPENDENT_AMBULATORY_CARE_PROVIDER_SITE_OTHER): Payer: Managed Care, Other (non HMO) | Admitting: Cardiology

## 2019-10-08 VITALS — BP 130/92 | HR 66 | Temp 98.0°F | Ht 71.0 in | Wt 226.4 lb

## 2019-10-08 DIAGNOSIS — I351 Nonrheumatic aortic (valve) insufficiency: Secondary | ICD-10-CM

## 2019-10-08 DIAGNOSIS — I10 Essential (primary) hypertension: Secondary | ICD-10-CM

## 2019-10-08 DIAGNOSIS — N1831 Chronic kidney disease, stage 3a: Secondary | ICD-10-CM

## 2019-10-08 DIAGNOSIS — Z7189 Other specified counseling: Secondary | ICD-10-CM

## 2019-10-08 DIAGNOSIS — R001 Bradycardia, unspecified: Secondary | ICD-10-CM

## 2019-10-08 DIAGNOSIS — I5042 Chronic combined systolic (congestive) and diastolic (congestive) heart failure: Secondary | ICD-10-CM

## 2019-10-08 DIAGNOSIS — E785 Hyperlipidemia, unspecified: Secondary | ICD-10-CM

## 2019-10-08 NOTE — Patient Instructions (Addendum)
Medication Instructions:  NO CHANGES *If you need a refill on your cardiac medications before your next appointment, please call your pharmacy*  Lab Work: Your physician recommends that you return for lab work TODAY (BMP)  Testing/Procedures: Your physician has requested that you have an echocardiogram. Echocardiography is a painless test that uses sound waves to create images of your heart. It provides your doctor with information about the size and shape of your heart and how well your heart's chambers and valves are working. This procedure takes approximately one hour. There are no restrictions for this procedure. [place]  Follow-Up: At [facility], you and your health needs are our priority.  As part of our continuing mission to provide you with exceptional heart care, we have created designated Provider Care Teams.  These Care Teams include your primary Cardiologist (physician) and Advanced Practice Providers (APPs -  Physician Assistants and Nurse Practitioners) who all work together to provide you with the care you need, when you need it.  We recommend signing up for the patient portal called "MyChart".  Sign up information is provided on this After Visit Summary.  MyChart is used to connect with patients for Virtual Visits (Telemedicine).  Patients are able to view lab/test results, encounter notes, upcoming appointments, etc.  Non-urgent messages can be sent to your provider as well.   To learn more about what you can do with MyChart, go to [email or web address].    Your next appointment:   12 month(s)  The format for your next appointment:   In Person  Provider:   Minus Breeding, MD

## 2019-10-09 LAB — BASIC METABOLIC PANEL
BUN/Creatinine Ratio: 16 (ref 10–24)
BUN: 32 mg/dL — ABNORMAL HIGH (ref 8–27)
CO2: 21 mmol/L (ref 20–29)
Calcium: 9.5 mg/dL (ref 8.6–10.2)
Chloride: 105 mmol/L (ref 96–106)
Creatinine, Ser: 2.02 mg/dL — ABNORMAL HIGH (ref 0.76–1.27)
GFR calc Af Amer: 38 mL/min/{1.73_m2} — ABNORMAL LOW (ref >59)
GFR calc non Af Amer: 33 mL/min/{1.73_m2} — ABNORMAL LOW (ref >59)
Glucose: 100 mg/dL — ABNORMAL HIGH (ref 65–99)
Potassium: 3.6 mmol/L (ref 3.5–5.2)
Sodium: 144 mmol/L (ref 134–144)

## 2019-10-10 ENCOUNTER — Telehealth: Payer: Self-pay

## 2019-10-10 DIAGNOSIS — Z79899 Other long term (current) drug therapy: Secondary | ICD-10-CM

## 2019-10-10 NOTE — Telephone Encounter (Signed)
-----   Message from Minus Breeding, MD sent at 10/09/2019  8:48 AM EDT ----- Creat is elevated on the Torsemide.  Please repeat a BMET again early next week.  If it is still up we will need to stop the Torsemide.

## 2019-10-15 LAB — BASIC METABOLIC PANEL
BUN/Creatinine Ratio: 15 (ref 10–24)
BUN: 27 mg/dL (ref 8–27)
CO2: 25 mmol/L (ref 20–29)
Calcium: 9.1 mg/dL (ref 8.6–10.2)
Chloride: 102 mmol/L (ref 96–106)
Creatinine, Ser: 1.81 mg/dL — ABNORMAL HIGH (ref 0.76–1.27)
GFR calc Af Amer: 43 mL/min/{1.73_m2} — ABNORMAL LOW (ref >59)
GFR calc non Af Amer: 37 mL/min/{1.73_m2} — ABNORMAL LOW (ref >59)
Glucose: 148 mg/dL — ABNORMAL HIGH (ref 65–99)
Potassium: 4.6 mmol/L (ref 3.5–5.2)
Sodium: 142 mmol/L (ref 134–144)

## 2019-10-18 ENCOUNTER — Other Ambulatory Visit: Payer: Self-pay | Admitting: Internal Medicine

## 2019-10-18 DIAGNOSIS — M545 Low back pain, unspecified: Secondary | ICD-10-CM

## 2019-10-21 ENCOUNTER — Ambulatory Visit: Payer: Managed Care, Other (non HMO) | Attending: Family Medicine

## 2019-10-21 ENCOUNTER — Other Ambulatory Visit: Payer: Self-pay

## 2019-10-21 DIAGNOSIS — M25511 Pain in right shoulder: Secondary | ICD-10-CM | POA: Diagnosis present

## 2019-10-21 DIAGNOSIS — M6281 Muscle weakness (generalized): Secondary | ICD-10-CM | POA: Diagnosis present

## 2019-10-21 DIAGNOSIS — M25512 Pain in left shoulder: Secondary | ICD-10-CM | POA: Diagnosis present

## 2019-10-21 DIAGNOSIS — G8929 Other chronic pain: Secondary | ICD-10-CM | POA: Insufficient documentation

## 2019-10-21 DIAGNOSIS — Z79899 Other long term (current) drug therapy: Secondary | ICD-10-CM

## 2019-10-21 NOTE — Therapy (Signed)
[place], [place], [address] Phone: [phone]   Fax:  [phone]  Physical Therapy Evaluation  Patient Details  Name: John Hogan. MRN: [id number] Date of Birth: January 15, 1951 Referring Provider (PT): John Leader, MD   Encounter Date: 10/21/2019  PT End of Session - 10/21/19 0834    Visit Number  1    Number of Visits  16    Date for PT Re-Evaluation  12/13/19    Authorization Type  [employer] MCR    PT Start Time  0745    PT Stop Time  0830    PT Time Calculation (min)  45 min    Activity Tolerance  Patient tolerated treatment well    Behavior During Therapy  [facility] for tasks assessed/performed       Past Medical History:  Diagnosis Date  . AI (aortic insufficiency)   . Chronic kidney disease   . Glaucoma   . Gout   . H/O exercise stress test    Myoview 01/03/12: No scar or ischemia, EF 53%  . Hyperlipidemia   . Hypertension   . Hypertensive cardiomyopathy ([place])    EF previously 35%;  echo 12/2012 EF 55%, mild AI, ascending aorta mildly dilated, mild LAE    Past Surgical History:  Procedure Laterality Date  . COLONOSCOPY    . CYST REMOVAL HAND    . HERNIA REPAIR     Umbilical  . POLYPECTOMY    . WRIST SURGERY     right    There were no vitals filed for this visit.   Subjective Assessment - 10/21/19 0747    Subjective  He reports left shouldr pain due to OA and does not want surgery/.  Wants to strengthen.  7 years ago injured shoulder.    Limitations  Lifting;House hold activities   yard work   Diagnostic tests  xray, ultrasound:  OA, torn RTC    Patient Stated Goals  improve strength    Currently in Pain?  No/denies    Pain Score  --   using arm reports mild pain.   Pain Location  Shoulder    Pain Orientation  Left    Pain Descriptors / Indicators  Aching    Pain Type  Chronic pain    Pain Onset  More than a month ago    Pain Frequency  Intermittent    Aggravating Factors   using  or lying on LT shoulder    Pain Relieving Factors  meds         [facility] PT Assessment - 10/21/19 0001      Assessment   Medical Diagnosis  chronic pain of both shoulders    Referring Provider (PT)  John Leader, MD    Onset Date/Surgical Date  --   7 years   Hand Dominance  Left    Next MD Visit  post PT     Prior Therapy  No      Precautions   Precautions  None      Restrictions   Weight Bearing Restrictions  No      Balance Screen   Has the patient fallen in the past 6 months  No      Prior Function   Level of Independence  Independent      Cognition   Overall Cognitive Status  Within Functional Limits for tasks assessed      Observation/Other Assessments   Focus on Therapeutic Outcomes (FOTO)   39%  limited      ROM / Strength   AROM / PROM / Strength  AROM;PROM;Strength      AROM   AROM Assessment Site  Shoulder    Right/Left Shoulder  Right;Left    Right Shoulder Flexion  160 Degrees    Right Shoulder ABduction  160 Degrees    Right Shoulder External Rotation  92 Degrees    Left Shoulder Flexion  95 Degrees    Left Shoulder ABduction  85 Degrees    Left Shoulder Internal Rotation  60 Degrees    Left Shoulder External Rotation  90 Degrees    Left Shoulder Horizontal ABduction  5 Degrees    Left Shoulder Horizontal ADduction  90 Degrees      PROM   PROM Assessment Site  Shoulder    Left Shoulder Flexion  162 Degrees    Left Shoulder ABduction  165 Degrees    Left Shoulder External Rotation  95 Degrees      Strength   Overall Strength Comments  scapula elevation and retraction WNL.     Strength Assessment Site  Shoulder    Right/Left Shoulder  Left;Right    Right Shoulder Flexion  4-/5    Right Shoulder Extension  4+/5    Right Shoulder ABduction  3-/5    Right Shoulder Internal Rotation  4+/5    Right Shoulder External Rotation  4-/5    Left Shoulder Flexion  2+/5    Left Shoulder Extension  4+/5    Left Shoulder ABduction  2/5    Left Shoulder  Internal Rotation  2+/5    Left Shoulder External Rotation  3-/5                Objective measurements completed on examination: See above findings.              PT Education - 10/21/19 8315    Education Details  HEP, POC    Person(s) Educated  Patient    Methods  Explanation;Demonstration;Tactile cues;Verbal cues;Handout    Comprehension  Returned demonstration;Verbalized understanding       PT Short Term Goals - 10/21/19 0841      PT SHORT TERM GOAL #1   Title  He will be independent with intial HEP    Time  4    Period  Weeks    Status  New      PT SHORT TERM GOAL #2   Title  He will be able to lift arms over head RT=LT    Time  4    Period  Weeks    Status  New      PT SHORT TERM GOAL #3   Title  He will overall report decreased pain with normal home activities    Time  4    Period  Weeks    Status  New      PT SHORT TERM GOAL #4   Title  FOTO will improve  to 31% limied    Time  4    Period  Weeks    Status  New        PT Long Term Goals - 10/21/19 0844      PT LONG TERM GOAL #1   Title  He will be independent with all HEP issued    Time  8    Period  Weeks    Status  New      PT LONG TERM GOAL #2   Title  He will  report able toi use both UE for home tasks with min pain    Time  8    Period  Weeks    Status  New      PT LONG TERM GOAL #3   Title  He will report able to lye on LT side without pain for 2-3 hours    Time  8    Period  Weeks    Status  New      PT LONG TERM GOAL #4   Title  He will be able to lift 3# over headto cabinets at home with no pain    Time  8    Period  Weeks    Status  New      PT LONG TERM GOAL #5   Title  Shoulder strength will improve to 4/5 minimall all groups    Time  8    Period  Weeks    Status  New             Plan - 10/21/19 0835    Clinical Impression Statement  John Hogan presents for eval with report of bilateral weakness and intermittant pain. LT>RT. He reported some  injuries in past that may have led to his complaints. Lt shoulder weaker than RT but PROM WNL bilaterally. He reports general independence unless reACHING [place].   hE SHOULD GAIN SOME STRNENGTH AND IMPROVE arom AND LIFTING BUT WILL BE LIMITED. sKILLED pt SHOULD HELP WITH IMPROVING BUT HE WILL NEED CONSISTENT HEP    Personal Factors and Comorbidities  Time since onset of injury/illness/exacerbation    Examination-Activity Limitations  Reach Overhead;Lift    Examination-Participation Restrictions  Cleaning;Yard Work    Stability/Clinical Decision Making  Stable/Uncomplicated    Clinical Decision Making  Low    Rehab Potential  Good    PT Frequency  2x / week    PT Duration  8 weeks    PT Treatment/Interventions  Manual techniques;Therapeutic activities;Patient/family education;Cryotherapy;Moist Heat    PT Next Visit Plan  Review HEP and add for strength isometric or band    PT Home Exercise Plan  isometric with band flexion /abduction/ER    Consulted and Agree with Plan of Care  Patient       Patient will benefit from skilled therapeutic intervention in order to improve the following deficits and impairments:  Pain, Decreased strength  Visit Diagnosis: Chronic left shoulder pain  Chronic right shoulder pain  Muscle weakness (generalized)     Problem List Patient Active Problem List   Diagnosis Date Noted  . Bradycardia 10/06/2019  . Educated about COVID-19 virus infection 10/06/2019  . Rotator cuff arthropathy of both shoulders 05/29/2019  . Bilateral acromioclavicular joint arthritis 05/29/2019  . Vitamin D deficiency 09/06/2017  . CKD (chronic kidney disease) stage 3, GFR 30-59 ml/min 08/07/2014  . Primary osteoarthritis of left shoulder 06/09/2014  . Insomnia 02/14/2014  . Erectile dysfunction 02/27/2013  . Papular atopic dermatitis 05/16/2012  . Gout 02/16/2012  . Routine general medical examination at a health care facility 02/16/2012  . Hyperlipidemia  with target LDL less than 100 02/16/2012  . Aortic regurgitation 02/16/2012  . Colon polyps 02/16/2012  . Essential hypertension, benign 03/07/2009  . Hypertensive heart disease without heart failure 03/07/2009  . COMBINED HEART FAILURE, CHRONIC 03/07/2009    Darrel Hoover  PT 10/21/2019, 8:54 AM  [facility] [address] [place], [place], [address] Phone: [phone]   Fax:  [phone]  Name: John Jim. MRN: [id number] Date of Birth: Oct 21, 1950

## 2019-10-21 NOTE — Patient Instructions (Addendum)
Green band isometric flexion/abduction /ER  10 reps 1-2 sets 1-2x/day, bilateral  5 sec hold

## 2019-10-22 ENCOUNTER — Ambulatory Visit (HOSPITAL_COMMUNITY): Payer: Managed Care, Other (non HMO) | Attending: Internal Medicine

## 2019-10-22 ENCOUNTER — Other Ambulatory Visit: Payer: Self-pay

## 2019-10-22 DIAGNOSIS — I10 Essential (primary) hypertension: Secondary | ICD-10-CM | POA: Diagnosis present

## 2019-10-22 DIAGNOSIS — I351 Nonrheumatic aortic (valve) insufficiency: Secondary | ICD-10-CM | POA: Diagnosis present

## 2019-10-28 ENCOUNTER — Encounter: Payer: Self-pay | Admitting: Physical Therapy

## 2019-10-28 ENCOUNTER — Ambulatory Visit: Payer: Managed Care, Other (non HMO) | Attending: Family Medicine | Admitting: Physical Therapy

## 2019-10-28 ENCOUNTER — Other Ambulatory Visit: Payer: Self-pay

## 2019-10-28 DIAGNOSIS — M25511 Pain in right shoulder: Secondary | ICD-10-CM | POA: Diagnosis present

## 2019-10-28 DIAGNOSIS — I519 Heart disease, unspecified: Secondary | ICD-10-CM | POA: Insufficient documentation

## 2019-10-28 DIAGNOSIS — M6281 Muscle weakness (generalized): Secondary | ICD-10-CM | POA: Insufficient documentation

## 2019-10-28 DIAGNOSIS — G8929 Other chronic pain: Secondary | ICD-10-CM | POA: Diagnosis present

## 2019-10-28 DIAGNOSIS — I5189 Other ill-defined heart diseases: Secondary | ICD-10-CM | POA: Insufficient documentation

## 2019-10-28 DIAGNOSIS — M25512 Pain in left shoulder: Secondary | ICD-10-CM | POA: Diagnosis present

## 2019-10-28 NOTE — Assessment & Plan Note (Signed)
IMPRESSIONS    1. Left ventricular ejection fraction, by estimation, is 60 to 65%. The left ventricle has normal function. The left ventricle has no regional wall motion abnormalities. There is mild left ventricular hypertrophy. Left ventricular diastolic parameters  are consistent with Grade I diastolic dysfunction (impaired relaxation).  2. Right ventricular systolic function is normal. The right ventricular size is normal.  3. The mitral valve is grossly normal. No evidence of mitral valve regurgitation.  4. The aortic valve is tricuspid. Aortic valve regurgitation is mild to moderate.  5. Aortic dilatation noted. There is mild dilatation of the ascending aorta and of the aortic root measuring 43 mm.  6. The inferior vena cava is normal in size with greater than 50% respiratory variability, suggesting right atrial pressure of 3 mmHg.

## 2019-10-28 NOTE — Therapy (Signed)
[place], [place], [address] Phone: [phone]   Fax:  [phone]  Physical Therapy Treatment  Patient Details  Name: John Hogan. MRN: [id number] Date of Birth: 1950/09/08 Referring Provider (PT): Lynne Leader, MD   Encounter Date: 10/28/2019  PT End of Session - 10/28/19 0718    Visit Number  2    Number of Visits  16    Date for PT Re-Evaluation  12/13/19    Authorization Type  [employer] MCR    PT Start Time  0715    PT Stop Time  0753    PT Time Calculation (min)  38 min       Past Medical History:  Diagnosis Date  . AI (aortic insufficiency)   . Chronic kidney disease   . Glaucoma   . Gout   . H/O exercise stress test    Myoview 01/03/12: No scar or ischemia, EF 53%  . Hyperlipidemia   . Hypertension   . Hypertensive cardiomyopathy ([place])    EF previously 35%;  echo 12/2012 EF 55%, mild AI, ascending aorta mildly dilated, mild LAE    Past Surgical History:  Procedure Laterality Date  . COLONOSCOPY    . CYST REMOVAL HAND    . HERNIA REPAIR     Umbilical  . POLYPECTOMY    . WRIST SURGERY     right    There were no vitals filed for this visit.  Subjective Assessment - 10/28/19 0717    Subjective  Pt reports right wrist gout flared up and he has performed limited HEP. He reports this is better now.    Currently in Pain?  No/denies                       [facility] Adult PT Treatment/Exercise - 10/28/19 0001      Shoulder Exercises: Supine   Horizontal ABduction  20 reps    Theraband Level (Shoulder Horizontal ABduction)  Level 3 (Green)    External Rotation  20 reps    Theraband Level (Shoulder External Rotation)  Level 2 (Red)    Other Supine Exercises  manual resist for isometric shoulder IR x 10 , ER x 10 bilat shoulders     Other Supine Exercises  supine shoulder pullovers with wooden dowel x 10, horizontals x 10       Shoulder Exercises: Standing   Extension  20  reps    Theraband Level (Shoulder Extension)  Level 3 (Green)    Row  20 reps    Theraband Level (Shoulder Row)  Level 3 (Green)    Other Standing Exercises  wall slides for flexion and abduction     Other Standing Exercises  Rock wood yellow band strengthening bilat x 12 each    green for flexion punch bilat , and for shoulder rows      Shoulder Exercises: Pulleys   Flexion  2 minutes               PT Short Term Goals - 10/21/19 0841      PT SHORT TERM GOAL #1   Title  He will be independent with intial HEP    Time  4    Period  Weeks    Status  New      PT SHORT TERM GOAL #2   Title  He will be able to lift arms over head RT=LT    Time  4  Period  Weeks    Status  New      PT SHORT TERM GOAL #3   Title  He will overall report decreased pain with normal home activities    Time  4    Period  Weeks    Status  New      PT SHORT TERM GOAL #4   Title  FOTO will improve  to 31% limied    Time  4    Period  Weeks    Status  New        PT Long Term Goals - 10/21/19 0844      PT LONG TERM GOAL #1   Title  He will be independent with all HEP issued    Time  8    Period  Weeks    Status  New      PT LONG TERM GOAL #2   Title  He will report able toi use both UE for home tasks with min pain    Time  8    Period  Weeks    Status  New      PT LONG TERM GOAL #3   Title  He will report able to lye on LT side without pain for 2-3 hours    Time  8    Period  Weeks    Status  New      PT LONG TERM GOAL #4   Title  He will be able to lift 3# over headto cabinets at home with no pain    Time  8    Period  Weeks    Status  New      PT LONG TERM GOAL #5   Title  Shoulder strength will improve to 4/5 minimall all groups    Time  8    Period  Weeks    Status  New            Plan - 10/28/19 0758    Clinical Impression Statement  Pt reports limited HEP due to gout in wrist. He is also modifying his HEP and was instructed in ways he can perform in  sitting. Progressed with AAROM and light strengthening. Session tolerated well.    PT Next Visit Plan  Review HEP and add for strength isometric or band    PT Home Exercise Plan  isometric with band flexion /abduction/ER       Patient will benefit from skilled therapeutic intervention in order to improve the following deficits and impairments:  Pain, Decreased strength  Visit Diagnosis: Chronic left shoulder pain  Chronic right shoulder pain  Muscle weakness (generalized)     Problem List Patient Active Problem List   Diagnosis Date Noted  . Bradycardia 10/06/2019  . Educated about COVID-19 virus infection 10/06/2019  . Rotator cuff arthropathy of both shoulders 05/29/2019  . Bilateral acromioclavicular joint arthritis 05/29/2019  . Vitamin D deficiency 09/06/2017  . CKD (chronic kidney disease) stage 3, GFR 30-59 ml/min 08/07/2014  . Primary osteoarthritis of left shoulder 06/09/2014  . Insomnia 02/14/2014  . Erectile dysfunction 02/27/2013  . Papular atopic dermatitis 05/16/2012  . Gout 02/16/2012  . Routine general medical examination at a health care facility 02/16/2012  . Hyperlipidemia with target LDL less than 100 02/16/2012  . Aortic regurgitation 02/16/2012  . Colon polyps 02/16/2012  . Essential hypertension, benign 03/07/2009  . Hypertensive heart disease without heart failure 03/07/2009  . COMBINED HEART FAILURE, CHRONIC 03/07/2009    Hessie Diener  Betsy Pries 10/28/2019, 8:00 AM  [facility] [address] [place], [place], [address] Phone: [phone]   Fax:  [phone]  Name: John Hogan. MRN: [id number] Date of Birth: 06/23/1951

## 2019-10-30 ENCOUNTER — Other Ambulatory Visit: Payer: Self-pay

## 2019-10-30 ENCOUNTER — Ambulatory Visit: Payer: Managed Care, Other (non HMO) | Admitting: Physical Therapy

## 2019-10-30 ENCOUNTER — Encounter: Payer: Self-pay | Admitting: Physical Therapy

## 2019-10-30 DIAGNOSIS — G8929 Other chronic pain: Secondary | ICD-10-CM

## 2019-10-30 DIAGNOSIS — M25512 Pain in left shoulder: Secondary | ICD-10-CM | POA: Diagnosis not present

## 2019-10-30 DIAGNOSIS — M6281 Muscle weakness (generalized): Secondary | ICD-10-CM

## 2019-10-30 DIAGNOSIS — M25511 Pain in right shoulder: Secondary | ICD-10-CM

## 2019-10-30 NOTE — Therapy (Signed)
[place], [place], [address] Phone: [phone]   Fax:  [phone]  Physical Therapy Treatment  Patient Details  Name: John Hogan. MRN: [id number] Date of Birth: July 08, 1950 Referring Provider (PT): John Leader, MD   Encounter Date: 10/30/2019  PT End of Session - 10/30/19 0935    Visit Number  3    Number of Visits  16    Date for PT Re-Evaluation  12/13/19    Authorization Type  [employer] MCR    PT Start Time  0931    PT Stop Time  1009    PT Time Calculation (min)  38 min       Past Medical History:  Diagnosis Date  . AI (aortic insufficiency)   . Chronic kidney disease   . Glaucoma   . Gout   . H/O exercise stress test    Myoview 01/03/12: No scar or ischemia, EF 53%  . Hyperlipidemia   . Hypertension   . Hypertensive cardiomyopathy ([place])    EF previously 35%;  echo 12/2012 EF 55%, mild AI, ascending aorta mildly dilated, mild LAE    Past Surgical History:  Procedure Laterality Date  . COLONOSCOPY    . CYST REMOVAL HAND    . HERNIA REPAIR     Umbilical  . POLYPECTOMY    . WRIST SURGERY     right    There were no vitals filed for this visit.  Subjective Assessment - 10/30/19 0934    Subjective  No pain , just sore, I think i over did it with the exercises at home.    Currently in Pain?  No/denies         [facility] PT Assessment - 10/30/19 0001      AROM   Left Shoulder Flexion  120 Degrees    Left Shoulder ABduction  130 Degrees                   [facility] Adult PT Treatment/Exercise - 10/30/19 0001      Shoulder Exercises: Supine   Horizontal ABduction  20 reps    Theraband Level (Shoulder Horizontal ABduction)  Level 3 (Green)    External Rotation  20 reps    Theraband Level (Shoulder External Rotation)  Level 1 (Yellow)    External Rotation Limitations  hold 5 sec for isometric hold     Other Supine Exercises  manual resist for isometric shoulder IR x 10 , ER x 10  bilat shoulders     Other Supine Exercises  supine shoulder pullovers with wooden dowel x 20, horizontals x 20 , ER       Shoulder Exercises: Standing   Protraction  Both;20 reps    Theraband Level (Shoulder Protraction)  Level 3 (Green)    External Rotation  Right;Left;15 reps    Theraband Level (Shoulder External Rotation)  Level 1 (Yellow)    Internal Rotation  Right;Left;15 reps    Theraband Level (Shoulder Internal Rotation)  Level 1 (Yellow)    Extension  20 reps    Theraband Level (Shoulder Extension)  Level 3 (Green)    Row  20 reps    Theraband Level (Shoulder Row)  Level 3 (Green)    Other Standing Exercises  wall slides for flexion and abduction x10 each bilateral      Shoulder Exercises: Pulleys   Flexion  2 minutes               PT  Short Term Goals - 10/21/19 0841      PT SHORT TERM GOAL #1   Title  He will be independent with intial HEP    Time  4    Period  Weeks    Status  New      PT SHORT TERM GOAL #2   Title  He will be able to lift arms over head RT=LT    Time  4    Period  Weeks    Status  New      PT SHORT TERM GOAL #3   Title  He will overall report decreased pain with normal home activities    Time  4    Period  Weeks    Status  New      PT SHORT TERM GOAL #4   Title  FOTO will improve  to 31% limied    Time  4    Period  Weeks    Status  New        PT Long Term Goals - 10/21/19 0844      PT LONG TERM GOAL #1   Title  He will be independent with all HEP issued    Time  8    Period  Weeks    Status  New      PT LONG TERM GOAL #2   Title  He will report able toi use both UE for home tasks with min pain    Time  8    Period  Weeks    Status  New      PT LONG TERM GOAL #3   Title  He will report able to lye on LT side without pain for 2-3 hours    Time  8    Period  Weeks    Status  New      PT LONG TERM GOAL #4   Title  He will be able to lift 3# over headto cabinets at home with no pain    Time  8    Period  Weeks     Status  New      PT LONG TERM GOAL #5   Title  Shoulder strength will improve to 4/5 minimall all groups    Time  8    Period  Weeks    Status  New            Plan - 10/30/19 1009    Clinical Impression Statement  Mr Repsher demonstrates improved Left shoulder AROM. He notes improvement in reaching. He is sore but not in pain. Continued with AAROM, and gentle strengthening as toelrated. No c/o pain during session.    PT Next Visit Plan  Review HEP and add for strength isometric or band    PT Home Exercise Plan  isometric with band flexion /abduction/ER       Patient will benefit from skilled therapeutic intervention in order to improve the following deficits and impairments:  Pain, Decreased strength  Visit Diagnosis: Chronic left shoulder pain  Chronic right shoulder pain  Muscle weakness (generalized)     Problem List Patient Active Problem List   Diagnosis Date Noted  . Grade I diastolic dysfunction 40/81/4481  . Bradycardia 10/06/2019  . Educated about COVID-19 virus infection 10/06/2019  . Rotator cuff arthropathy of both shoulders 05/29/2019  . Bilateral acromioclavicular joint arthritis 05/29/2019  . Vitamin D deficiency 09/06/2017  . CKD (chronic kidney disease) stage 3, GFR 30-59 ml/min 08/07/2014  . Primary  osteoarthritis of left shoulder 06/09/2014  . Insomnia 02/14/2014  . Erectile dysfunction 02/27/2013  . Papular atopic dermatitis 05/16/2012  . Gout 02/16/2012  . Routine general medical examination at a health care facility 02/16/2012  . Hyperlipidemia with target LDL less than 100 02/16/2012  . Aortic regurgitation 02/16/2012  . Colon polyps 02/16/2012  . Essential hypertension, benign 03/07/2009  . Hypertensive heart disease without heart failure 03/07/2009  . COMBINED HEART FAILURE, CHRONIC 03/07/2009    Dorene Ar, PTA 10/30/2019, 10:10 AM  [place], [place], [address] Phone: [phone]   Fax:  [phone]  Name: John Hogan. MRN: [id number] Date of Birth: 1951-01-03

## 2019-11-04 ENCOUNTER — Other Ambulatory Visit: Payer: Self-pay

## 2019-11-04 ENCOUNTER — Ambulatory Visit: Payer: Managed Care, Other (non HMO) | Admitting: Physical Therapy

## 2019-11-04 ENCOUNTER — Encounter: Payer: Self-pay | Admitting: Physical Therapy

## 2019-11-04 DIAGNOSIS — M25511 Pain in right shoulder: Secondary | ICD-10-CM

## 2019-11-04 DIAGNOSIS — G8929 Other chronic pain: Secondary | ICD-10-CM

## 2019-11-04 DIAGNOSIS — M25512 Pain in left shoulder: Secondary | ICD-10-CM

## 2019-11-04 DIAGNOSIS — M6281 Muscle weakness (generalized): Secondary | ICD-10-CM

## 2019-11-04 NOTE — Therapy (Signed)
[place], [place], [address] Phone: [phone]   Fax:  [phone]  Physical Therapy Treatment  Patient Details  Name: John Hogan. MRN: [id number] Date of Birth: April 29, 1951 Referring Provider (PT): Lynne Leader, MD   Encounter Date: 11/04/2019  PT End of Session - 11/04/19 0719    Visit Number  4    Number of Visits  16    Date for PT Re-Evaluation  12/13/19    Authorization Type  [employer] MCR    PT Start Time  0715    PT Stop Time  0758    PT Time Calculation (min)  43 min       Past Medical History:  Diagnosis Date  . AI (aortic insufficiency)   . Chronic kidney disease   . Glaucoma   . Gout   . H/O exercise stress test    Myoview 01/03/12: No scar or ischemia, EF 53%  . Hyperlipidemia   . Hypertension   . Hypertensive cardiomyopathy ([place])    EF previously 35%;  echo 12/2012 EF 55%, mild AI, ascending aorta mildly dilated, mild LAE    Past Surgical History:  Procedure Laterality Date  . COLONOSCOPY    . CYST REMOVAL HAND    . HERNIA REPAIR     Umbilical  . POLYPECTOMY    . WRIST SURGERY     right    There were no vitals filed for this visit.  Subjective Assessment - 11/04/19 0719    Subjective  No pain, Soreness is still there.    Currently in Pain?  No/denies                       [facility] Adult PT Treatment/Exercise - 11/04/19 0001      Shoulder Exercises: Supine   Horizontal ABduction  20 reps    Theraband Level (Shoulder Horizontal ABduction)  Level 3 (Green)    External Rotation  20 reps    Theraband Level (Shoulder External Rotation)  Level 2 (Red)    External Rotation Limitations  hold 5 sec for isometric hold     Flexion  Both;20 reps    Flexion Limitations  elbow flex to extension 2#       Shoulder Exercises: Sidelying   External Rotation  AAROM;Strengthening;20 reps    External Rotation Weight (lbs)  1   right   External Rotation Limitations  no wt  on left       Shoulder Exercises: Standing   Protraction  Both;20 reps    Theraband Level (Shoulder Protraction)  Level 3 (Green)    External Rotation  Right;Left;15 reps    Theraband Level (Shoulder External Rotation)  Level 2 (Red)    Internal Rotation  Right;Left;15 reps    Theraband Level (Shoulder Internal Rotation)  Level 2 (Red)    Flexion Limitations  ball on wall circles CW/CCW Rt and Lt     Extension  20 reps    Theraband Level (Shoulder Extension)  Level 3 (Green)    Row  20 reps    Theraband Level (Shoulder Row)  Level 3 (Green)    Other Standing Exercises  wall slides for flexion and abduction x10 each bilateral    Other Standing Exercises  2# bilateral, lateral raise       Shoulder Exercises: Pulleys   Flexion  2 minutes               PT Short Term  Goals - 10/21/19 0841      PT SHORT TERM GOAL #1   Title  He will be independent with intial HEP    Time  4    Period  Weeks    Status  New      PT SHORT TERM GOAL #2   Title  He will be able to lift arms over head RT=LT    Time  4    Period  Weeks    Status  New      PT SHORT TERM GOAL #3   Title  He will overall report decreased pain with normal home activities    Time  4    Period  Weeks    Status  New      PT SHORT TERM GOAL #4   Title  FOTO will improve  to 31% limied    Time  4    Period  Weeks    Status  New        PT Long Term Goals - 10/21/19 0844      PT LONG TERM GOAL #1   Title  He will be independent with all HEP issued    Time  8    Period  Weeks    Status  New      PT LONG TERM GOAL #2   Title  He will report able toi use both UE for home tasks with min pain    Time  8    Period  Weeks    Status  New      PT LONG TERM GOAL #3   Title  He will report able to lye on LT side without pain for 2-3 hours    Time  8    Period  Weeks    Status  New      PT LONG TERM GOAL #4   Title  He will be able to lift 3# over headto cabinets at home with no pain    Time  8    Period   Weeks    Status  New      PT LONG TERM GOAL #5   Title  Shoulder strength will improve to 4/5 minimall all groups    Time  8    Period  Weeks    Status  New            Plan - 11/04/19 8938    Clinical Impression Statement  Pt reports improved sleep tolerance and no waking due to pain. He is able to lay on left side without limitation. He lacks external rotation AROM in left sidelying. Continued with AAROM and gentle strengthening as toierated.    PT Next Visit Plan  Review HEP and add for strength isometric or band    PT Home Exercise Plan  isometric with band flexion /abduction/ER       Patient will benefit from skilled therapeutic intervention in order to improve the following deficits and impairments:  Pain, Decreased strength  Visit Diagnosis: Chronic left shoulder pain  Chronic right shoulder pain  Muscle weakness (generalized)     Problem List Patient Active Problem List   Diagnosis Date Noted  . Grade I diastolic dysfunction 04/12/5101  . Bradycardia 10/06/2019  . Educated about COVID-19 virus infection 10/06/2019  . Rotator cuff arthropathy of both shoulders 05/29/2019  . Bilateral acromioclavicular joint arthritis 05/29/2019  . Vitamin D deficiency 09/06/2017  . CKD (chronic kidney disease) stage 3, GFR 30-59 ml/min 08/07/2014  .  Primary osteoarthritis of left shoulder 06/09/2014  . Insomnia 02/14/2014  . Erectile dysfunction 02/27/2013  . Papular atopic dermatitis 05/16/2012  . Gout 02/16/2012  . Routine general medical examination at a health care facility 02/16/2012  . Hyperlipidemia with target LDL less than 100 02/16/2012  . Aortic regurgitation 02/16/2012  . Colon polyps 02/16/2012  . Essential hypertension, benign 03/07/2009  . Hypertensive heart disease without heart failure 03/07/2009  . COMBINED HEART FAILURE, CHRONIC 03/07/2009    Dorene Ar, PTA 11/04/2019, 7:59 AM  [place], [place], [address] Phone: [phone]   Fax:  [phone]  Name: John Hogan. MRN: [id number] Date of Birth: 1950/07/25

## 2019-11-06 ENCOUNTER — Ambulatory Visit: Payer: Managed Care, Other (non HMO) | Admitting: Physical Therapy

## 2019-11-06 ENCOUNTER — Other Ambulatory Visit: Payer: Self-pay

## 2019-11-06 ENCOUNTER — Encounter: Payer: Self-pay | Admitting: Physical Therapy

## 2019-11-06 DIAGNOSIS — M25512 Pain in left shoulder: Secondary | ICD-10-CM | POA: Diagnosis not present

## 2019-11-06 DIAGNOSIS — G8929 Other chronic pain: Secondary | ICD-10-CM

## 2019-11-06 DIAGNOSIS — M6281 Muscle weakness (generalized): Secondary | ICD-10-CM

## 2019-11-06 NOTE — Therapy (Signed)
[place], [place], [address] Phone: [phone]   Fax:  [phone]  Physical Therapy Treatment  Patient Details  Name: John Hogan. MRN: [id number] Date of Birth: 1950-08-25 Referring Provider (PT): Lynne Leader, MD   Encounter Date: 11/06/2019  PT End of Session - 11/06/19 1235    Visit Number  5    Number of Visits  16    Date for PT Re-Evaluation  12/13/19    Authorization Type  [employer] MCR    PT Start Time  1230    PT Stop Time  1309    PT Time Calculation (min)  39 min       Past Medical History:  Diagnosis Date  . AI (aortic insufficiency)   . Chronic kidney disease   . Glaucoma   . Gout   . H/O exercise stress test    Myoview 01/03/12: No scar or ischemia, EF 53%  . Hyperlipidemia   . Hypertension   . Hypertensive cardiomyopathy ([place])    EF previously 35%;  echo 12/2012 EF 55%, mild AI, ascending aorta mildly dilated, mild LAE    Past Surgical History:  Procedure Laterality Date  . COLONOSCOPY    . CYST REMOVAL HAND    . HERNIA REPAIR     Umbilical  . POLYPECTOMY    . WRIST SURGERY     right    There were no vitals filed for this visit.  Subjective Assessment - 11/06/19 1234    Subjective  2 days ago, I almost fell missing a step, caught myself, but I strained my back on the right side. The shoulder is getting better. I can sleep alot better.    Currently in Pain?  Yes   right back pain 3/10 since falling  2 days ago , no shoulder pain.                       [facility] Adult PT Treatment/Exercise - 11/06/19 0001      Shoulder Exercises: Supine   Horizontal ABduction  20 reps    Theraband Level (Shoulder Horizontal ABduction)  Level 3 (Green)    Flexion  Both;20 reps    Flexion Limitations  extended arm 1# each     Other Supine Exercises  manual resist to isometric ER 20 reps each     Other Supine Exercises  3# chest press and pullovers with dowel       Shoulder Exercises: Sidelying   External Rotation  Strengthening;20 reps    External Rotation Weight (lbs)  1    External Rotation Limitations  able to use 1# on both UE       Shoulder Exercises: Standing   External Rotation  20 reps;Both    Theraband Level (Shoulder External Rotation)  Level 2 (Red)    Internal Rotation  20 reps    Theraband Level (Shoulder Internal Rotation)  Level 2 (Red)    Flexion Limitations  1# on wrist wall washing x 1 minute each     Extension  20 reps    Theraband Level (Shoulder Extension)  Level 3 (Green)    Row  20 reps    Theraband Level (Shoulder Row)  Level 3 (Green)    Other Standing Exercises  seated over head press 1# x 10 each     Other Standing Exercises  1# bilateral forward and lateral raises (scaption)       Shoulder Exercises: Pulleys  Flexion  2 minutes               PT Short Term Goals - 10/21/19 0841      PT SHORT TERM GOAL #1   Title  He will be independent with intial HEP    Time  4    Period  Weeks    Status  New      PT SHORT TERM GOAL #2   Title  He will be able to lift arms over head RT=LT    Time  4    Period  Weeks    Status  New      PT SHORT TERM GOAL #3   Title  He will overall report decreased pain with normal home activities    Time  4    Period  Weeks    Status  New      PT SHORT TERM GOAL #4   Title  FOTO will improve  to 31% limied    Time  4    Period  Weeks    Status  New        PT Long Term Goals - 10/21/19 0844      PT LONG TERM GOAL #1   Title  He will be independent with all HEP issued    Time  8    Period  Weeks    Status  New      PT LONG TERM GOAL #2   Title  He will report able toi use both UE for home tasks with min pain    Time  8    Period  Weeks    Status  New      PT LONG TERM GOAL #3   Title  He will report able to lye on LT side without pain for 2-3 hours    Time  8    Period  Weeks    Status  New      PT LONG TERM GOAL #4   Title  He will be able to lift 3#  over headto cabinets at home with no pain    Time  8    Period  Weeks    Status  New      PT LONG TERM GOAL #5   Title  Shoulder strength will improve to 4/5 minimall all groups    Time  8    Period  Weeks    Status  New            Plan - 11/06/19 1309    Clinical Impression Statement  Continued with strength and endurance challenges for bilateral shoulders. ER strength in sidelying improving on left, able to lift to neutral with 1# today. Manual resist to isometrics improved for ER.    PT Next Visit Plan  Review HEP and add for strength isometric or band    PT Home Exercise Plan  isometric with band flexion /abduction/ER       Patient will benefit from skilled therapeutic intervention in order to improve the following deficits and impairments:  Pain, Decreased strength  Visit Diagnosis: Chronic left shoulder pain  Chronic right shoulder pain  Muscle weakness (generalized)     Problem List Patient Active Problem List   Diagnosis Date Noted  . Grade I diastolic dysfunction 49/70/2637  . Bradycardia 10/06/2019  . Educated about COVID-19 virus infection 10/06/2019  . Rotator cuff arthropathy of both shoulders 05/29/2019  . Bilateral acromioclavicular joint arthritis 05/29/2019  . Vitamin  D deficiency 09/06/2017  . CKD (chronic kidney disease) stage 3, GFR 30-59 ml/min 08/07/2014  . Primary osteoarthritis of left shoulder 06/09/2014  . Insomnia 02/14/2014  . Erectile dysfunction 02/27/2013  . Papular atopic dermatitis 05/16/2012  . Gout 02/16/2012  . Routine general medical examination at a health care facility 02/16/2012  . Hyperlipidemia with target LDL less than 100 02/16/2012  . Aortic regurgitation 02/16/2012  . Colon polyps 02/16/2012  . Essential hypertension, benign 03/07/2009  . Hypertensive heart disease without heart failure 03/07/2009  . COMBINED HEART FAILURE, CHRONIC 03/07/2009    Dorene Ar, PTA 11/06/2019, 1:11 PM  [facility] [address] [place], [place], [address] Phone: [phone]   Fax:  [phone]  Name: John Hogan. MRN: [id number] Date of Birth: [birth year]

## 2019-11-11 ENCOUNTER — Other Ambulatory Visit: Payer: Self-pay

## 2019-11-11 ENCOUNTER — Ambulatory Visit: Payer: Managed Care, Other (non HMO)

## 2019-11-11 DIAGNOSIS — M25512 Pain in left shoulder: Secondary | ICD-10-CM

## 2019-11-11 DIAGNOSIS — M6281 Muscle weakness (generalized): Secondary | ICD-10-CM

## 2019-11-11 DIAGNOSIS — G8929 Other chronic pain: Secondary | ICD-10-CM

## 2019-11-11 NOTE — Therapy (Signed)
[place], [place], [address] Phone: [phone]   Fax:  [phone]  Physical Therapy Treatment  Patient Details  Name: John Hogan. MRN: [id number] Date of Birth: 1951-03-24 Referring Provider (PT): Lynne Leader, MD   Encounter Date: 11/11/2019  PT End of Session - 11/11/19 0700    Visit Number  6    Number of Visits  16    Date for PT Re-Evaluation  12/13/19    Authorization Type  [employer] MCR    PT Start Time  0700    PT Stop Time  0745    PT Time Calculation (min)  45 min    Activity Tolerance  Patient tolerated treatment well    Behavior During Therapy  [facility] for tasks assessed/performed       Past Medical History:  Diagnosis Date  . AI (aortic insufficiency)   . Chronic kidney disease   . Glaucoma   . Gout   . H/O exercise stress test    Myoview 01/03/12: No scar or ischemia, EF 53%  . Hyperlipidemia   . Hypertension   . Hypertensive cardiomyopathy ([place])    EF previously 35%;  echo 12/2012 EF 55%, mild AI, ascending aorta mildly dilated, mild LAE    Past Surgical History:  Procedure Laterality Date  . COLONOSCOPY    . CYST REMOVAL HAND    . HERNIA REPAIR     Umbilical  . POLYPECTOMY    . WRIST SURGERY     right    There were no vitals filed for this visit.  Subjective Assessment - 11/11/19 0704    Subjective  No pain today    Currently in Pain?  No/denies         [facility] PT Assessment - 11/11/19 0001      AROM   Right Shoulder Flexion  160 Degrees    Right Shoulder ABduction  160 Degrees    Right Shoulder External Rotation  92 Degrees    Left Shoulder Flexion  135 Degrees   scaption   Left Shoulder ABduction  130 Degrees    Left Shoulder External Rotation  90 Degrees      Strength   Right Shoulder Flexion  4/5    Right Shoulder External Rotation  3+/5    Left Shoulder Flexion  3/5    Left Shoulder ABduction  3/5    Left Shoulder External Rotation  3-/5                     [facility] Adult PT Treatment/Exercise - 11/11/19 0001      Shoulder Exercises: Sidelying   External Rotation  Strengthening;20 reps    External Rotation Limitations  assisted to full ROM then slowly lowered RT/LT      Shoulder Exercises: Standing   Protraction  Both;20 reps    Theraband Level (Shoulder Protraction)  Level 3 (Green)    External Rotation  20 reps;Both    Theraband Level (Shoulder External Rotation)  Level 3 (Green)    Internal Rotation  20 reps    Theraband Level (Shoulder Internal Rotation)  Level 3 (Green)    Flexion Limitations  1# on wrist wall washing x 1 minute each     Extension  20 reps;Both    Theraband Level (Shoulder Extension)  Level 3 (Green)    Row  Both;20 reps    Theraband Level (Shoulder Row)  Level 3 (Green)    Other Standing Exercises  seated over head press 2# x 10 each     Other Standing Exercises  2# bilateral forward and lateral raises (scaption)       Shoulder Exercises: Pulleys   Flexion  2 minutes      Shoulder Exercises: ROM/Strengthening   UBE (Upper Arm Bike)  l1 5 min forward             PT Education - 11/11/19 0743    Education Details  MMT results    Person(s) Educated  Patient    Methods  Explanation    Comprehension  Verbalized understanding       PT Short Term Goals - 11/11/19 0744      PT SHORT TERM GOAL #1   Title  He will be independent with intial HEP    Status  Achieved      PT SHORT TERM GOAL #2   Title  He will be able to lift arms over head RT=LT    Status  On-going      PT SHORT TERM GOAL #3   Title  He will overall report decreased pain with normal home activities    Status  On-going        PT Long Term Goals - 10/21/19 0844      PT LONG TERM GOAL #1   Title  He will be independent with all HEP issued    Time  8    Period  Weeks    Status  New      PT LONG TERM GOAL #2   Title  He will report able toi use both UE for home tasks with min pain    Time  8     Period  Weeks    Status  New      PT LONG TERM GOAL #3   Title  He will report able to lye on LT side without pain for 2-3 hours    Time  8    Period  Weeks    Status  New      PT LONG TERM GOAL #4   Title  He will be able to lift 3# over headto cabinets at home with no pain    Time  8    Period  Weeks    Status  New      PT LONG TERM GOAL #5   Title  Shoulder strength will improve to 4/5 minimall all groups    Time  8    Period  Weeks    Status  New            Plan - 11/11/19 0701    Clinical Impression Statement  Genmerally strength and ROM improved but ER in side lye still <3/5 unalble to go full ROM    PT Treatment/Interventions  Manual techniques;Therapeutic activities;Patient/family education;Cryotherapy;Moist Heat    PT Next Visit Plan  add for strength isometric or band  for HEP    PT Home Exercise Plan  isometric with band flexion /abduction/ER    Consulted and Agree with Plan of Care  Patient       Patient will benefit from skilled therapeutic intervention in order to improve the following deficits and impairments:  Pain, Decreased strength  Visit Diagnosis: Chronic right shoulder pain  Muscle weakness (generalized)  Chronic left shoulder pain     Problem List Patient Active Problem List   Diagnosis Date Noted  . Grade I diastolic dysfunction 76/73/4193  . Bradycardia 10/06/2019  . Educated  about COVID-19 virus infection 10/06/2019  . Rotator cuff arthropathy of both shoulders 05/29/2019  . Bilateral acromioclavicular joint arthritis 05/29/2019  . Vitamin D deficiency 09/06/2017  . CKD (chronic kidney disease) stage 3, GFR 30-59 ml/min 08/07/2014  . Primary osteoarthritis of left shoulder 06/09/2014  . Insomnia 02/14/2014  . Erectile dysfunction 02/27/2013  . Papular atopic dermatitis 05/16/2012  . Gout 02/16/2012  . Routine general medical examination at a health care facility 02/16/2012  . Hyperlipidemia with target LDL less than 100  02/16/2012  . Aortic regurgitation 02/16/2012  . Colon polyps 02/16/2012  . Essential hypertension, benign 03/07/2009  . Hypertensive heart disease without heart failure 03/07/2009  . COMBINED HEART FAILURE, CHRONIC 03/07/2009    Darrel Hoover  PT 11/11/2019, 7:47 AM  [facility] [address] [place], [place], [address] Phone: [phone]   Fax:  [phone]  Name: Levell Tavano. MRN: [id number] Date of Birth: 07/02/1950

## 2019-11-13 ENCOUNTER — Other Ambulatory Visit: Payer: Self-pay

## 2019-11-13 ENCOUNTER — Encounter: Payer: Self-pay | Admitting: Physical Therapy

## 2019-11-13 ENCOUNTER — Ambulatory Visit: Payer: Managed Care, Other (non HMO) | Admitting: Physical Therapy

## 2019-11-13 ENCOUNTER — Other Ambulatory Visit: Payer: Self-pay | Admitting: Family Medicine

## 2019-11-13 DIAGNOSIS — M25512 Pain in left shoulder: Secondary | ICD-10-CM | POA: Diagnosis not present

## 2019-11-13 DIAGNOSIS — G8929 Other chronic pain: Secondary | ICD-10-CM

## 2019-11-13 DIAGNOSIS — M6281 Muscle weakness (generalized): Secondary | ICD-10-CM

## 2019-11-13 NOTE — Therapy (Signed)
[place], [place], [address] Phone: [phone]   Fax:  [phone]  Physical Therapy Treatment  Patient Details  Name: John Hogan. MRN: [id number] Date of Birth: 05-Nov-1950 Referring Provider (PT): Lynne Leader, MD   Encounter Date: 11/13/2019  PT End of Session - 11/13/19 0720    Visit Number  7    Number of Visits  16    Date for PT Re-Evaluation  12/13/19    Authorization Type  [employer] MCR    PT Start Time  0715    PT Stop Time  0800    PT Time Calculation (min)  45 min       Past Medical History:  Diagnosis Date  . AI (aortic insufficiency)   . Chronic kidney disease   . Glaucoma   . Gout   . H/O exercise stress test    Myoview 01/03/12: No scar or ischemia, EF 53%  . Hyperlipidemia   . Hypertension   . Hypertensive cardiomyopathy ([place])    EF previously 35%;  echo 12/2012 EF 55%, mild AI, ascending aorta mildly dilated, mild LAE    Past Surgical History:  Procedure Laterality Date  . COLONOSCOPY    . CYST REMOVAL HAND    . HERNIA REPAIR     Umbilical  . POLYPECTOMY    . WRIST SURGERY     right    There were no vitals filed for this visit.  Subjective Assessment - 11/13/19 0719    Subjective  I have a gout flare up in my left elbow today. 7/10 left elbow. Shoulder are no pain.    Currently in Pain?  Yes    Pain Score  7    Left elbow, due to gout                       [facility] Adult PT Treatment/Exercise - 11/13/19 0001      Shoulder Exercises: Supine   Horizontal ABduction  20 reps    Theraband Level (Shoulder Horizontal ABduction)  Level 3 (Green)    Other Supine Exercises  3# chest press and pullovers with dowel      Shoulder Exercises: Sidelying   External Rotation  Strengthening;Right;Left;20 reps    External Rotation Limitations  assisted to full ROM then slowly lowered RT/LT      Shoulder Exercises: Standing   Protraction  Right;20 reps    Theraband  Level (Shoulder Protraction)  Level 3 (Green)    External Rotation  20 reps;Both    Theraband Level (Shoulder External Rotation)  Level 3 (Green)    Internal Rotation  Both;20 reps    Theraband Level (Shoulder Internal Rotation)  Level 3 (Green)    Flexion Limitations  2# on wrist wall washing x 1 minute each     Row  Both;20 reps    Theraband Level (Shoulder Row)  Level 3 (Green)    Other Standing Exercises  2# cabinet reaching top shelf - pain in left elbow but able to perform       Shoulder Exercises: Pulleys   Flexion  2 minutes      Shoulder Exercises: ROM/Strengthening   UBE (Upper Arm Bike)  L2 3 min each way                PT Short Term Goals - 11/11/19 0744      PT SHORT TERM GOAL #1   Title  He will be independent  with intial HEP    Status  Achieved      PT SHORT TERM GOAL #2   Title  He will be able to lift arms over head RT=LT    Status  On-going      PT SHORT TERM GOAL #3   Title  He will overall report decreased pain with normal home activities    Status  On-going        PT Long Term Goals - 10/21/19 0844      PT LONG TERM GOAL #1   Title  He will be independent with all HEP issued    Time  8    Period  Weeks    Status  New      PT LONG TERM GOAL #2   Title  He will report able toi use both UE for home tasks with min pain    Time  8    Period  Weeks    Status  New      PT LONG TERM GOAL #3   Title  He will report able to lye on LT side without pain for 2-3 hours    Time  8    Period  Weeks    Status  New      PT LONG TERM GOAL #4   Title  He will be able to lift 3# over headto cabinets at home with no pain    Time  8    Period  Weeks    Status  New      PT LONG TERM GOAL #5   Title  Shoulder strength will improve to 4/5 minimall all groups    Time  8    Period  Weeks    Status  New            Plan - 11/13/19 0802    Clinical Impression Statement  Pt was limited by left elbow pain fro gout today however managed to perform  reps of each exercise. He continues to have most deficits in bilateral ER strength. He was able to lift 2# into top shelf of cabinet today with each UE.    PT Next Visit Plan  strength, AAROM    PT Home Exercise Plan  isometric with band flexion /abduction/ER       Patient will benefit from skilled therapeutic intervention in order to improve the following deficits and impairments:  Pain, Decreased strength  Visit Diagnosis: No diagnosis found.     Problem List Patient Active Problem List   Diagnosis Date Noted  . Grade I diastolic dysfunction 44/31/5400  . Bradycardia 10/06/2019  . Educated about COVID-19 virus infection 10/06/2019  . Rotator cuff arthropathy of both shoulders 05/29/2019  . Bilateral acromioclavicular joint arthritis 05/29/2019  . Vitamin D deficiency 09/06/2017  . CKD (chronic kidney disease) stage 3, GFR 30-59 ml/min 08/07/2014  . Primary osteoarthritis of left shoulder 06/09/2014  . Insomnia 02/14/2014  . Erectile dysfunction 02/27/2013  . Papular atopic dermatitis 05/16/2012  . Gout 02/16/2012  . Routine general medical examination at a health care facility 02/16/2012  . Hyperlipidemia with target LDL less than 100 02/16/2012  . Aortic regurgitation 02/16/2012  . Colon polyps 02/16/2012  . Essential hypertension, benign 03/07/2009  . Hypertensive heart disease without heart failure 03/07/2009  . COMBINED HEART FAILURE, CHRONIC 03/07/2009    Dorene Ar, PTA 11/13/2019, 8:04 AM  [facility] [address] [place], [place], [address] Phone: [phone]  Fax:  [phone]  Name: John Hogan. MRN: [id number] Date of Birth: 10-13-1950

## 2019-11-18 LAB — BASIC METABOLIC PANEL
BUN/Creatinine Ratio: 13 (ref 10–24)
BUN: 25 mg/dL (ref 8–27)
CO2: 22 mmol/L (ref 20–29)
Calcium: 9.4 mg/dL (ref 8.6–10.2)
Chloride: 102 mmol/L (ref 96–106)
Creatinine, Ser: 1.97 mg/dL — ABNORMAL HIGH (ref 0.76–1.27)
GFR calc Af Amer: 39 mL/min/{1.73_m2} — ABNORMAL LOW (ref >59)
GFR calc non Af Amer: 33 mL/min/{1.73_m2} — ABNORMAL LOW (ref >59)
Glucose: 109 mg/dL — ABNORMAL HIGH (ref 65–99)
Potassium: 4.3 mmol/L (ref 3.5–5.2)
Sodium: 142 mmol/L (ref 134–144)

## 2019-11-19 ENCOUNTER — Ambulatory Visit: Payer: Managed Care, Other (non HMO)

## 2019-11-20 ENCOUNTER — Ambulatory Visit: Payer: Managed Care, Other (non HMO) | Admitting: Physical Therapy

## 2019-11-20 ENCOUNTER — Other Ambulatory Visit: Payer: Self-pay

## 2019-11-20 ENCOUNTER — Encounter: Payer: Self-pay | Admitting: Physical Therapy

## 2019-11-20 DIAGNOSIS — M25511 Pain in right shoulder: Secondary | ICD-10-CM

## 2019-11-20 DIAGNOSIS — M25512 Pain in left shoulder: Secondary | ICD-10-CM | POA: Diagnosis not present

## 2019-11-20 DIAGNOSIS — M6281 Muscle weakness (generalized): Secondary | ICD-10-CM

## 2019-11-20 NOTE — Therapy (Signed)
[place], [place], [address] Phone: [phone]   Fax:  [phone]  Physical Therapy Treatment  Patient Details  Name: John Hogan. MRN: [id number] Date of Birth: 09-04-1950 Referring Provider (PT): Lynne Leader, MD   Encounter Date: 11/20/2019  PT End of Session - 11/20/19 0928    Visit Number  8    Number of Visits  16    Date for PT Re-Evaluation  12/13/19    Authorization Type  [employer] MCR    PT Start Time  5993   in late d/t long line at check in   PT Stop Time  1001    PT Time Calculation (min)  34 min    Activity Tolerance  Patient tolerated treatment well    Behavior During Therapy  [facility] for tasks assessed/performed       Past Medical History:  Diagnosis Date  . AI (aortic insufficiency)   . Chronic kidney disease   . Glaucoma   . Gout   . H/O exercise stress test    Myoview 01/03/12: No scar or ischemia, EF 53%  . Hyperlipidemia   . Hypertension   . Hypertensive cardiomyopathy ([place])    EF previously 35%;  echo 12/2012 EF 55%, mild AI, ascending aorta mildly dilated, mild LAE    Past Surgical History:  Procedure Laterality Date  . COLONOSCOPY    . CYST REMOVAL HAND    . HERNIA REPAIR     Umbilical  . POLYPECTOMY    . WRIST SURGERY     right    There were no vitals filed for this visit.  Subjective Assessment - 11/20/19 0927    Subjective  Pt reports that his elbow is a little bit better today.  The Lt elbow feels better than the Rt today not sure if he over did it or not at home.    Patient Stated Goals  improve strength    Currently in Pain?  No/denies   can just tell there is something different        [facility] PT Assessment - 11/20/19 0001      Assessment   Medical Diagnosis  chronic pain of both shoulders    Referring Provider (PT)  Lynne Leader, MD    Next MD Visit  post PT                     [facility] Adult PT Treatment/Exercise - 11/20/19 0001      Exercises   Exercises  Shoulder      Shoulder Exercises: Supine   Flexion  Strengthening;Both;15 reps;Weights    Shoulder Flexion Weight (lbs)  2    Other Supine Exercises  hands behind head, elbow presses down , flow between shoulder goal post to scarecrow.     Other Supine Exercises  15 reps punch with 2#       Shoulder Exercises: Prone   Other Prone Exercises  10 reps each lying with arms off EOB, T's palm down and thumbs up, physical cues for form , same movement in Y       Shoulder Exercises: Sidelying   External Rotation  Strengthening;Both;20 reps;Weights    External Rotation Weight (lbs)  1   Lt 23 Rt   External Rotation Limitations  towel under elbow    Other Sidelying Exercises  empty can in limited ROM with 1# Lt, 2# Rt     Other Sidelying Exercises  10 reps open book  10 reps 1# Lt , 2# Rt       Shoulder Exercises: ROM/Strengthening   UBE (Upper Arm Bike)  L2 3 min each way                PT Short Term Goals - 11/11/19 0744      PT SHORT TERM GOAL #1   Title  He will be independent with intial HEP    Status  Achieved      PT SHORT TERM GOAL #2   Title  He will be able to lift arms over head RT=LT    Status  On-going      PT SHORT TERM GOAL #3   Title  He will overall report decreased pain with normal home activities    Status  On-going        PT Long Term Goals - 10/21/19 0844      PT LONG TERM GOAL #1   Title  He will be independent with all HEP issued    Time  8    Period  Weeks    Status  New      PT LONG TERM GOAL #2   Title  He will report able toi use both UE for home tasks with min pain    Time  8    Period  Weeks    Status  New      PT LONG TERM GOAL #3   Title  He will report able to lye on LT side without pain for 2-3 hours    Time  8    Period  Weeks    Status  New      PT LONG TERM GOAL #4   Title  He will be able to lift 3# over headto cabinets at home with no pain    Time  8    Period  Weeks    Status  New      PT  LONG TERM GOAL #5   Title  Shoulder strength will improve to 4/5 minimall all groups    Time  8    Period  Weeks    Status  New            Plan - 11/20/19 8295    Clinical Impression Statement  Pt reports his elbow is feeling a little better from his gout flare up. shoulders overall are improving, still has significant weakness Lt > Rt.  Requires some cues for form with exercise to focus on the correct muscles.  Making progress to goals.    Rehab Potential  Good    PT Frequency  2x / week    PT Duration  8 weeks    PT Treatment/Interventions  Manual techniques;Therapeutic activities;Patient/family education;Cryotherapy;Moist Heat    PT Next Visit Plan  strength RTC and scapula bilat    Consulted and Agree with Plan of Care  Patient       Patient will benefit from skilled therapeutic intervention in order to improve the following deficits and impairments:     Visit Diagnosis: Chronic right shoulder pain  Muscle weakness (generalized)  Chronic left shoulder pain     Problem List Patient Active Problem List   Diagnosis Date Noted  . Grade I diastolic dysfunction 62/13/0865  . Bradycardia 10/06/2019  . Educated about COVID-19 virus infection 10/06/2019  . Rotator cuff arthropathy of both shoulders 05/29/2019  . Bilateral acromioclavicular joint arthritis 05/29/2019  . Vitamin D deficiency 09/06/2017  . CKD (chronic kidney  disease) stage 3, GFR 30-59 ml/min 08/07/2014  . Primary osteoarthritis of left shoulder 06/09/2014  . Insomnia 02/14/2014  . Erectile dysfunction 02/27/2013  . Papular atopic dermatitis 05/16/2012  . Gout 02/16/2012  . Routine general medical examination at a health care facility 02/16/2012  . Hyperlipidemia with target LDL less than 100 02/16/2012  . Aortic regurgitation 02/16/2012  . Colon polyps 02/16/2012  . Essential hypertension, benign 03/07/2009  . Hypertensive heart disease without heart failure 03/07/2009  . COMBINED HEART FAILURE,  CHRONIC 03/07/2009    Jeral Pinch PT  11/20/2019, 9:59 AM  [facility] [address] [place], [place], [address] Phone: [phone]   Fax:  [phone]  Name: Vaishnav Demartin. MRN: [id number] Date of Birth: 03/31/1951

## 2019-11-21 ENCOUNTER — Ambulatory Visit: Payer: Managed Care, Other (non HMO)

## 2019-11-21 DIAGNOSIS — G8929 Other chronic pain: Secondary | ICD-10-CM

## 2019-11-21 DIAGNOSIS — M25512 Pain in left shoulder: Secondary | ICD-10-CM | POA: Diagnosis not present

## 2019-11-21 DIAGNOSIS — M6281 Muscle weakness (generalized): Secondary | ICD-10-CM

## 2019-11-21 NOTE — Therapy (Signed)
[place], [place], [address] Phone: [phone]   Fax:  [phone]  Physical Therapy Treatment  Patient Details  Name: John Hogan. MRN: [id number] Date of Birth: [birth year] Referring Provider (PT): Lynne Leader, MD   Encounter Date: 11/21/2019  PT End of Session - 11/21/19 1140    Visit Number  9    Number of Visits  16    Date for PT Re-Evaluation  12/13/19    Authorization Type  [employer] MCR    PT Start Time  1139   late   PT Stop Time  1217    PT Time Calculation (min)  38 min    Activity Tolerance  Patient tolerated treatment well    Behavior During Therapy  [facility] for tasks assessed/performed       Past Medical History:  Diagnosis Date  . AI (aortic insufficiency)   . Chronic kidney disease   . Glaucoma   . Gout   . H/O exercise stress test    Myoview 01/03/12: No scar or ischemia, EF 53%  . Hyperlipidemia   . Hypertension   . Hypertensive cardiomyopathy ([place])    EF previously 35%;  echo 12/2012 EF 55%, mild AI, ascending aorta mildly dilated, mild LAE    Past Surgical History:  Procedure Laterality Date  . COLONOSCOPY    . CYST REMOVAL HAND    . HERNIA REPAIR     Umbilical  . POLYPECTOMY    . WRIST SURGERY     right    There were no vitals filed for this visit.  Subjective Assessment - 11/21/19 1142    Subjective  Sore from yesterday in shoulders. Overall better as I can rest at night.    Pain Score  2     Pain Location  Shoulder    Pain Orientation  Right;Left    Pain Descriptors / Indicators  Sore    Pain Type  Chronic pain    Pain Onset  More than a month ago    Pain Frequency  Intermittent    Aggravating Factors   using or lyingon shoulder/arms         [facility] PT Assessment - 11/21/19 0001      Observation/Other Assessments   Focus on Therapeutic Outcomes (FOTO)   35% limited from 37% limited                    [facility] Adult PT Treatment/Exercise -  11/21/19 0001      Shoulder Exercises: Supine   Horizontal ABduction  Both;15 reps    Theraband Level (Shoulder Horizontal ABduction)  Level 3 (Green)    Flexion  Both;12 reps    Shoulder Flexion Weight (lbs)  2      Shoulder Exercises: Sidelying   External Rotation  Right;Left;15 reps    External Rotation Limitations  assisted to max ROM then worked on isometric hold and slow eccentris      Shoulder Exercises: Standing   External Rotation  20 reps;Both    Theraband Level (Shoulder External Rotation)  Level 3 (Green)    Internal Rotation  Both;20 reps    Theraband Level (Shoulder Internal Rotation)  Level 3 (Green)    Extension  20 reps;Both    Theraband Level (Shoulder Extension)  Level 3 (Green)    Row  Both;20 reps    Theraband Level (Shoulder Row)  Level 3 (Green)      Shoulder Exercises: ROM/Strengthening   UBE (  Upper Arm Bike)  L2 3 min each way                PT Short Term Goals - 11/21/19 1144      PT SHORT TERM GOAL #1   Title  He will be independent with intial HEP    Status  Achieved      PT SHORT TERM GOAL #2   Title  He will be able to lift arms over head RT=LT      PT SHORT TERM GOAL #3   Title  He will overall report decreased pain with normal home activities    Status  Achieved      PT SHORT TERM GOAL #4   Title  FOTO will improve  to 31% limied    Baseline  35% limited    Status  On-going        PT Long Term Goals - 10/21/19 0844      PT LONG TERM GOAL #1   Title  He will be independent with all HEP issued    Time  8    Period  Weeks    Status  New      PT LONG TERM GOAL #2   Title  He will report able toi use both UE for home tasks with min pain    Time  8    Period  Weeks    Status  New      PT LONG TERM GOAL #3   Title  He will report able to lye on LT side without pain for 2-3 hours    Time  8    Period  Weeks    Status  New      PT LONG TERM GOAL #4   Title  He will be able to lift 3# over headto cabinets at home with  no pain    Time  8    Period  Weeks    Status  New      PT LONG TERM GOAL #5   Title  Shoulder strength will improve to 4/5 minimall all groups    Time  8    Period  Weeks    Status  New            Plan - 11/21/19 1144    Clinical Impression Statement  He reports he is overall better with being able to rest at night with no increased pain. Sore from yesterday PT but no pain. 35% limited 2 point improvement.    PT Treatment/Interventions  Manual techniques;Therapeutic activities;Patient/family education;Cryotherapy;Moist Heat    PT Next Visit Plan  strength RTC and scapula bilat    PT Home Exercise Plan  isometric with band flexion /abduction/ER    Consulted and Agree with Plan of Care  Patient       Patient will benefit from skilled therapeutic intervention in order to improve the following deficits and impairments:  Pain, Decreased strength  Visit Diagnosis: Chronic right shoulder pain  Muscle weakness (generalized)  Chronic left shoulder pain     Problem List Patient Active Problem List   Diagnosis Date Noted  . Grade I diastolic dysfunction 65/78/4696  . Bradycardia 10/06/2019  . Educated about COVID-19 virus infection 10/06/2019  . Rotator cuff arthropathy of both shoulders 05/29/2019  . Bilateral acromioclavicular joint arthritis 05/29/2019  . Vitamin D deficiency 09/06/2017  . CKD (chronic kidney disease) stage 3, GFR 30-59 ml/min 08/07/2014  . Primary osteoarthritis of left shoulder 06/09/2014  .  Insomnia 02/14/2014  . Erectile dysfunction 02/27/2013  . Papular atopic dermatitis 05/16/2012  . Gout 02/16/2012  . Routine general medical examination at a health care facility 02/16/2012  . Hyperlipidemia with target LDL less than 100 02/16/2012  . Aortic regurgitation 02/16/2012  . Colon polyps 02/16/2012  . Essential hypertension, benign 03/07/2009  . Hypertensive heart disease without heart failure 03/07/2009  . COMBINED HEART FAILURE, CHRONIC  03/07/2009    Darrel Hoover  PT 11/21/2019, 2:33 PM  [facility] [address] [place], [place], [address] Phone: [phone]   Fax:  [phone]  Name: John Hogan. MRN: [id number] Date of Birth: [birth year]

## 2019-11-26 ENCOUNTER — Other Ambulatory Visit: Payer: Self-pay

## 2019-11-26 ENCOUNTER — Ambulatory Visit: Payer: Managed Care, Other (non HMO) | Attending: Family Medicine

## 2019-11-26 DIAGNOSIS — G8929 Other chronic pain: Secondary | ICD-10-CM | POA: Insufficient documentation

## 2019-11-26 DIAGNOSIS — M25512 Pain in left shoulder: Secondary | ICD-10-CM

## 2019-11-26 DIAGNOSIS — M25511 Pain in right shoulder: Secondary | ICD-10-CM | POA: Insufficient documentation

## 2019-11-26 DIAGNOSIS — M6281 Muscle weakness (generalized): Secondary | ICD-10-CM | POA: Insufficient documentation

## 2019-11-26 NOTE — Therapy (Signed)
[place], [place], [address] Phone: [phone]   Fax:  [phone]  Physical Therapy Treatment  Patient Details  Name: John Hogan. MRN: [id number] Date of Birth: 03/07/1951 Referring Provider (PT): Lynne Leader, MD  Progress Note Reporting Period  10/21/19  to 11/26/19  See note below for Objective Data and Assessment of Progress/Goals.      Encounter Date: 11/26/2019  PT End of Session - 11/26/19 0702    Visit Number  10    Number of Visits  16    Date for PT Re-Evaluation  12/13/19    PT Start Time  0700    PT Stop Time  0740    PT Time Calculation (min)  40 min    Activity Tolerance  Patient tolerated treatment well    Behavior During Therapy  [facility] for tasks assessed/performed       Past Medical History:  Diagnosis Date  . AI (aortic insufficiency)   . Chronic kidney disease   . Glaucoma   . Gout   . H/O exercise stress test    Myoview 01/03/12: No scar or ischemia, EF 53%  . Hyperlipidemia   . Hypertension   . Hypertensive cardiomyopathy ([place])    EF previously 35%;  echo 12/2012 EF 55%, mild AI, ascending aorta mildly dilated, mild LAE    Past Surgical History:  Procedure Laterality Date  . COLONOSCOPY    . CYST REMOVAL HAND    . HERNIA REPAIR     Umbilical  . POLYPECTOMY    . WRIST SURGERY     right    There were no vitals filed for this visit.  Subjective Assessment - 11/26/19 0701    Subjective  Shoulders alright. No pain this AM    Currently in Pain?  No/denies         [facility] PT Assessment - 11/26/19 0001      Assessment   Medical Diagnosis  chronic pain of both shoulders    Referring Provider (PT)  Lynne Leader, MD      AROM   Right Shoulder Flexion  160 Degrees    Right Shoulder ABduction  160 Degrees    Right Shoulder External Rotation  92 Degrees   sitting , < 40 degrees in sidelye   Left Shoulder Flexion  140 Degrees    Left Shoulder ABduction  130 Degrees     Left Shoulder External Rotation  90 Degrees   sitting ,< 40 degrees in sidelye                   [facility] Adult PT Treatment/Exercise - 11/26/19 0001      Shoulder Exercises: Prone   Other Prone Exercises  rows  x 15  15#      Shoulder Exercises: Sidelying   External Rotation  Right;Left;15 reps    External Rotation Limitations  assisted to max ROM then worked on isometric hold and slow eccentris    ABduction  AROM;Right;Left;12 reps      Shoulder Exercises: Standing   External Rotation  20 reps;Both    Theraband Level (Shoulder External Rotation)  Level 4 (Blue)    Internal Rotation  20 reps;Right;Left    Theraband Level (Shoulder Internal Rotation)  Level 4 (Blue)    Extension  20 reps;Both    Theraband Level (Shoulder Extension)  Level 4 (Blue)    Row  Both;20 reps    Theraband Level (Shoulder Row)  Level 4 (  Blue)    Other Standing Exercises  seated over head press 3# x 10 each     Other Standing Exercises  punches blue band x 15      Shoulder Exercises: ROM/Strengthening   UBE (Upper Arm Bike)  L2 3 min each way                PT Short Term Goals - 11/26/19 0753      PT SHORT TERM GOAL #1   Title  He will be independent with intial HEP    Status  Achieved      PT SHORT TERM GOAL #2   Title  He will be able to lift arms over head RT=LT    Baseline  LT arm less than RT.    Status  On-going      PT SHORT TERM GOAL #3   Title  He will overall report decreased pain with normal home activities    Status  Achieved      PT SHORT TERM GOAL #4   Title  FOTO will improve  to 31% limied    Baseline  35% limited    Status  On-going        PT Long Term Goals - 10/21/19 0844      PT LONG TERM GOAL #1   Title  He will be independent with all HEP issued    Time  8    Period  Weeks    Status  New      PT LONG TERM GOAL #2   Title  He will report able toi use both UE for home tasks with min pain    Time  8    Period  Weeks    Status  New       PT LONG TERM GOAL #3   Title  He will report able to lye on LT side without pain for 2-3 hours    Time  8    Period  Weeks    Status  New      PT LONG TERM GOAL #4   Title  He will be able to lift 3# over headto cabinets at home with no pain    Time  8    Period  Weeks    Status  New      PT LONG TERM GOAL #5   Title  Shoulder strength will improve to 4/5 minimall all groups    Time  8    Period  Weeks    Status  New            Plan - 11/26/19 0707    Clinical Impression Statement  He tolereated increased resistance with exerciser today.  No pain post and no excessive fatigue.   ER and abduction still very weak and crerpitus noted with ER and abduction in sidelying    PT Treatment/Interventions  Manual techniques;Therapeutic activities;Patient/family education;Cryotherapy;Moist Heat    PT Next Visit Plan  strength RTC and scapula bilat    PT Home Exercise Plan  isometric with band flexion /abduction/ER    Consulted and Agree with Plan of Care  Patient       Patient will benefit from skilled therapeutic intervention in order to improve the following deficits and impairments:  Pain, Decreased strength  Visit Diagnosis: Chronic right shoulder pain  Muscle weakness (generalized)  Chronic left shoulder pain     Problem List Patient Active Problem List   Diagnosis Date Noted  . Grade  I diastolic dysfunction 74/01/1447  . Bradycardia 10/06/2019  . Educated about COVID-19 virus infection 10/06/2019  . Rotator cuff arthropathy of both shoulders 05/29/2019  . Bilateral acromioclavicular joint arthritis 05/29/2019  . Vitamin D deficiency 09/06/2017  . CKD (chronic kidney disease) stage 3, GFR 30-59 ml/min 08/07/2014  . Primary osteoarthritis of left shoulder 06/09/2014  . Insomnia 02/14/2014  . Erectile dysfunction 02/27/2013  . Papular atopic dermatitis 05/16/2012  . Gout 02/16/2012  . Routine general medical examination at a health care facility 02/16/2012  .  Hyperlipidemia with target LDL less than 100 02/16/2012  . Aortic regurgitation 02/16/2012  . Colon polyps 02/16/2012  . Essential hypertension, benign 03/07/2009  . Hypertensive heart disease without heart failure 03/07/2009  . COMBINED HEART FAILURE, CHRONIC 03/07/2009    Darrel Hoover  PT 11/26/2019, 8:11 AM  [facility] [address] [place], [place], [address] Phone: [phone]   Fax:  [phone]  Name: Nyzir Dubois. MRN: [id number] Date of Birth: August 26, 1950

## 2019-11-28 ENCOUNTER — Telehealth: Payer: Self-pay

## 2019-11-28 ENCOUNTER — Encounter: Payer: Self-pay | Admitting: Physical Therapy

## 2019-11-28 ENCOUNTER — Other Ambulatory Visit: Payer: Self-pay

## 2019-11-28 ENCOUNTER — Ambulatory Visit: Payer: Managed Care, Other (non HMO) | Admitting: Physical Therapy

## 2019-11-28 DIAGNOSIS — M25511 Pain in right shoulder: Secondary | ICD-10-CM | POA: Diagnosis not present

## 2019-11-28 DIAGNOSIS — G8929 Other chronic pain: Secondary | ICD-10-CM | POA: Diagnosis present

## 2019-11-28 DIAGNOSIS — M25512 Pain in left shoulder: Secondary | ICD-10-CM | POA: Diagnosis present

## 2019-11-28 DIAGNOSIS — Z79899 Other long term (current) drug therapy: Secondary | ICD-10-CM

## 2019-11-28 DIAGNOSIS — M6281 Muscle weakness (generalized): Secondary | ICD-10-CM | POA: Diagnosis present

## 2019-11-28 DIAGNOSIS — N1831 Chronic kidney disease, stage 3a: Secondary | ICD-10-CM

## 2019-11-28 DIAGNOSIS — I10 Essential (primary) hypertension: Secondary | ICD-10-CM

## 2019-11-28 NOTE — Telephone Encounter (Signed)
-----   Message from Minus Breeding, MD sent at 11/20/2019  8:07 AM EDT ----- Creat is mildly elevated but not severely increased from baseline.  I would suggest repeat a BMET in one month.  Call Mr. Devincentis with the results and send results to Janith Lima, MD

## 2019-11-28 NOTE — Telephone Encounter (Signed)
Made patient aware that additional lab work is needed.

## 2019-11-28 NOTE — Telephone Encounter (Signed)
BMP order placed, lab slips mailed to patient. Detailed VM left per DPR.

## 2019-11-28 NOTE — Therapy (Signed)
[place], [place], [address] Phone: [phone]   Fax:  [phone]  Physical Therapy Treatment  Patient Details  Name: John Hogan. MRN: [id number] Date of Birth: 07/09/1950 Referring Provider (PT): Lynne Leader, MD   Encounter Date: 11/28/2019  PT End of Session - 11/28/19 0719    Visit Number  11    Number of Visits  16    Date for PT Re-Evaluation  12/13/19    Authorization Type  [employer] MCR    PT Start Time  0715    PT Stop Time  0800    PT Time Calculation (min)  45 min       Past Medical History:  Diagnosis Date  . AI (aortic insufficiency)   . Chronic kidney disease   . Glaucoma   . Gout   . H/O exercise stress test    Myoview 01/03/12: No scar or ischemia, EF 53%  . Hyperlipidemia   . Hypertension   . Hypertensive cardiomyopathy ([place])    EF previously 35%;  echo 12/2012 EF 55%, mild AI, ascending aorta mildly dilated, mild LAE    Past Surgical History:  Procedure Laterality Date  . COLONOSCOPY    . CYST REMOVAL HAND    . HERNIA REPAIR     Umbilical  . POLYPECTOMY    . WRIST SURGERY     right    There were no vitals filed for this visit.                     [facility] Adult PT Treatment/Exercise - 11/28/19 0001      Shoulder Exercises: Supine   Flexion  20 reps    Shoulder Flexion Weight (lbs)  2      Shoulder Exercises: Sidelying   External Rotation  20 reps    External Rotation Limitations  assisted to max ROM then worked on isometric hold and slow eccentris    ABduction  AROM;Right;Left;20 reps      Shoulder Exercises: Standing   External Rotation  20 reps;Both    Theraband Level (Shoulder External Rotation)  Level 4 (Blue)    Internal Rotation  20 reps;Right;Left    Theraband Level (Shoulder Internal Rotation)  Level 4 (Blue)    Flexion  AROM;Right;Left;10 reps    Flexion Limitations  wall washing x 2 minutes no wt     Extension  20 reps;Both    Theraband Level (Shoulder Extension)  Level 4 (Blue)    Row  Both;20 reps    Theraband Level (Shoulder Row)  Level 4 (Blue)    Diagonals Limitations  standing scaption AROM bilat x 15     Other Standing Exercises  standing  over head press 3# x 10 each     Other Standing Exercises  punches blue band x 15, cabinet reaching 3# 10 x 2 middle and top shelf bilateral       Shoulder Exercises: Pulleys   Flexion  2 minutes    Scaption  1 minute      Shoulder Exercises: ROM/Strengthening   UBE (Upper Arm Bike)  L3 3 min each way                PT Short Term Goals - 11/26/19 0753      PT SHORT TERM GOAL #1   Title  He will be independent with intial HEP    Status  Achieved      PT SHORT TERM GOAL #  2   Title  He will be able to lift arms over head RT=LT    Baseline  LT arm less than RT.    Status  On-going      PT SHORT TERM GOAL #3   Title  He will overall report decreased pain with normal home activities    Status  Achieved      PT SHORT TERM GOAL #4   Title  FOTO will improve  to 31% limied    Baseline  35% limited    Status  On-going        PT Long Term Goals - 10/21/19 0844      PT LONG TERM GOAL #1   Title  He will be independent with all HEP issued    Time  8    Period  Weeks    Status  New      PT LONG TERM GOAL #2   Title  He will report able toi use both UE for home tasks with min pain    Time  8    Period  Weeks    Status  New      PT LONG TERM GOAL #3   Title  He will report able to lye on LT side without pain for 2-3 hours    Time  8    Period  Weeks    Status  New      PT LONG TERM GOAL #4   Title  He will be able to lift 3# over headto cabinets at home with no pain    Time  8    Period  Weeks    Status  New      PT LONG TERM GOAL #5   Title  Shoulder strength will improve to 4/5 minimall all groups    Time  8    Period  Weeks    Status  New            Plan - 11/28/19 0849    Clinical Impression Statement  Continued with Blue  theraband resistance and light dumbbells for strengthening. Able to use 3# and reach into top cabinet with RUE, middle cabnet with LUE. Toelrated increased repetitions today.    PT Next Visit Plan  strength RTC and scapula bilat    PT Home Exercise Plan  isometric with band flexion /abduction/ER       Patient will benefit from skilled therapeutic intervention in order to improve the following deficits and impairments:  Pain, Decreased strength  Visit Diagnosis: Chronic right shoulder pain  Muscle weakness (generalized)  Chronic left shoulder pain     Problem List Patient Active Problem List   Diagnosis Date Noted  . Grade I diastolic dysfunction 33/82/5053  . Bradycardia 10/06/2019  . Educated about COVID-19 virus infection 10/06/2019  . Rotator cuff arthropathy of both shoulders 05/29/2019  . Bilateral acromioclavicular joint arthritis 05/29/2019  . Vitamin D deficiency 09/06/2017  . CKD (chronic kidney disease) stage 3, GFR 30-59 ml/min 08/07/2014  . Primary osteoarthritis of left shoulder 06/09/2014  . Insomnia 02/14/2014  . Erectile dysfunction 02/27/2013  . Papular atopic dermatitis 05/16/2012  . Gout 02/16/2012  . Routine general medical examination at a health care facility 02/16/2012  . Hyperlipidemia with target LDL less than 100 02/16/2012  . Aortic regurgitation 02/16/2012  . Colon polyps 02/16/2012  . Essential hypertension, benign 03/07/2009  . Hypertensive heart disease without heart failure 03/07/2009  . COMBINED HEART FAILURE, CHRONIC 03/07/2009  Dorene Ar, [place] 11/28/2019, 8:55 AM  [place], [place], [address] Phone: [phone]   Fax:  [phone]  Name: John Hogan. MRN: [id number] Date of Birth: 07-Dec-1950

## 2019-12-02 ENCOUNTER — Other Ambulatory Visit: Payer: Self-pay

## 2019-12-02 ENCOUNTER — Ambulatory Visit: Payer: Managed Care, Other (non HMO)

## 2019-12-02 DIAGNOSIS — M25511 Pain in right shoulder: Secondary | ICD-10-CM | POA: Diagnosis not present

## 2019-12-02 DIAGNOSIS — M6281 Muscle weakness (generalized): Secondary | ICD-10-CM

## 2019-12-02 DIAGNOSIS — G8929 Other chronic pain: Secondary | ICD-10-CM

## 2019-12-02 NOTE — Therapy (Signed)
[place], [place], [address] Phone: [phone]   Fax:  [phone]  Physical Therapy Treatment  Patient Details  Name: John Hogan. MRN: [id number] Date of Birth: 1950-11-15 Referring Provider (PT): Lynne Leader, MD   Encounter Date: 12/02/2019  PT End of Session - 12/02/19 0705    Visit Number  12    Number of Visits  16    Date for PT Re-Evaluation  12/13/19    Authorization Type  [employer] MCR    PT Start Time  0702    PT Stop Time  0740    PT Time Calculation (min)  38 min    Activity Tolerance  Patient tolerated treatment well    Behavior During Therapy  [facility] for tasks assessed/performed       Past Medical History:  Diagnosis Date  . AI (aortic insufficiency)   . Chronic kidney disease   . Glaucoma   . Gout   . H/O exercise stress test    Myoview 01/03/12: No scar or ischemia, EF 53%  . Hyperlipidemia   . Hypertension   . Hypertensive cardiomyopathy ([place])    EF previously 35%;  echo 12/2012 EF 55%, mild AI, ascending aorta mildly dilated, mild LAE    Past Surgical History:  Procedure Laterality Date  . COLONOSCOPY    . CYST REMOVAL HAND    . HERNIA REPAIR     Umbilical  . POLYPECTOMY    . WRIST SURGERY     right    There were no vitals filed for this visit.  Subjective Assessment - 12/02/19 0706    Subjective  No pain . Hew feels he is doing well and will finish filal 3 appointments then discharge    Currently in Pain?  No/denies                        [facility] Adult PT Treatment/Exercise - 12/02/19 0001      Shoulder Exercises: Sidelying   External Rotation  20 reps    External Rotation Limitations  assisted to max ROM then worked on isometric hold and slow eccentris    ABduction  AROM;Right;Left;20 reps      Shoulder Exercises: Standing   External Rotation  --    Theraband Level (Shoulder External Rotation)  --    Internal Rotation  20 reps;Right;Left     Theraband Level (Shoulder Internal Rotation)  Level 4 (Blue)    Flexion  AROM;Right;Left;10 reps    Flexion Limitations  wall washing x 2 minutes no wt     Extension  20 reps;Both    Theraband Level (Shoulder Extension)  Level 4 (Blue)    Row  Both;20 reps    Diagonals Limitations  standing scaption AROM bilat x 15     Other Standing Exercises  standing  over head press 3# x 10 each , biep curls x 20 6# ,  blue band hor adduction x 20 RT/LT    Other Standing Exercises  punches blue band x 15, cabinet reaching 3# 10 x 2 middle shelf bilateral       Shoulder Exercises: ROM/Strengthening   UBE (Upper Arm Bike)  L3 3 min each way                PT Short Term Goals - 11/26/19 0753      PT SHORT TERM GOAL #1   Title  He will be independent with intial HEP  Status  Achieved      PT SHORT TERM GOAL #2   Title  He will be able to lift arms over head RT=LT    Baseline  LT arm less than RT.    Status  On-going      PT SHORT TERM GOAL #3   Title  He will overall report decreased pain with normal home activities    Status  Achieved      PT SHORT TERM GOAL #4   Title  FOTO will improve  to 31% limied    Baseline  35% limited    Status  On-going        PT Long Term Goals - 10/21/19 0844      PT LONG TERM GOAL #1   Title  He will be independent with all HEP issued    Time  8    Period  Weeks    Status  New      PT LONG TERM GOAL #2   Title  He will report able toi use both UE for home tasks with min pain    Time  8    Period  Weeks    Status  New      PT LONG TERM GOAL #3   Title  He will report able to lye on LT side without pain for 2-3 hours    Time  8    Period  Weeks    Status  New      PT LONG TERM GOAL #4   Title  He will be able to lift 3# over headto cabinets at home with no pain    Time  8    Period  Weeks    Status  New      PT LONG TERM GOAL #5   Title  Shoulder strength will improve to 4/5 minimall all groups    Time  8    Period  Weeks     Status  New            Plan - 12/02/19 0706    Clinical Impression Statement  Doing well. Able to do home tasks now. mild limit. will finish P OC then discharge.  no pain post.    PT Treatment/Interventions  Manual techniques;Therapeutic activities;Patient/family education;Cryotherapy;Moist Heat    PT Next Visit Plan  strength RTC and scapula bilat    PT Home Exercise Plan  isometric with band flexion /abduction/ER    Consulted and Agree with Plan of Care  Patient       Patient will benefit from skilled therapeutic intervention in order to improve the following deficits and impairments:  Pain, Decreased strength  Visit Diagnosis: Chronic right shoulder pain  Muscle weakness (generalized)  Chronic left shoulder pain     Problem List Patient Active Problem List   Diagnosis Date Noted  . Grade I diastolic dysfunction 30/02/2329  . Bradycardia 10/06/2019  . Educated about COVID-19 virus infection 10/06/2019  . Rotator cuff arthropathy of both shoulders 05/29/2019  . Bilateral acromioclavicular joint arthritis 05/29/2019  . Vitamin D deficiency 09/06/2017  . CKD (chronic kidney disease) stage 3, GFR 30-59 ml/min 08/07/2014  . Primary osteoarthritis of left shoulder 06/09/2014  . Insomnia 02/14/2014  . Erectile dysfunction 02/27/2013  . Papular atopic dermatitis 05/16/2012  . Gout 02/16/2012  . Routine general medical examination at a health care facility 02/16/2012  . Hyperlipidemia with target LDL less than 100 02/16/2012  . Aortic regurgitation 02/16/2012  . Colon  polyps 02/16/2012  . Essential hypertension, benign 03/07/2009  . Hypertensive heart disease without heart failure 03/07/2009  . COMBINED HEART FAILURE, CHRONIC 03/07/2009    Darrel Hoover  PT 12/02/2019, 7:42 AM  [facility] [address] [place], [place], [address] Phone: [phone]   Fax:  [phone]  Name: Aakash Hollomon. MRN:  [id number] Date of Birth: Feb 28, 1951

## 2019-12-04 ENCOUNTER — Other Ambulatory Visit: Payer: Self-pay

## 2019-12-04 ENCOUNTER — Ambulatory Visit: Payer: Managed Care, Other (non HMO) | Admitting: Physical Therapy

## 2019-12-04 ENCOUNTER — Encounter: Payer: Self-pay | Admitting: Physical Therapy

## 2019-12-04 DIAGNOSIS — M6281 Muscle weakness (generalized): Secondary | ICD-10-CM

## 2019-12-04 DIAGNOSIS — G8929 Other chronic pain: Secondary | ICD-10-CM

## 2019-12-04 DIAGNOSIS — M25511 Pain in right shoulder: Secondary | ICD-10-CM | POA: Diagnosis not present

## 2019-12-04 NOTE — Therapy (Signed)
[place], [place], [address] Phone: [phone]   Fax:  [phone]  Physical Therapy Treatment  Patient Details  Name: John Hogan. MRN: [id number] Date of Birth: 06/16/1951 Referring Provider (PT): Lynne Leader, MD   Encounter Date: 12/04/2019  PT End of Session - 12/04/19 0720    Visit Number  13    Number of Visits  16    Date for PT Re-Evaluation  12/13/19    Authorization Type  [employer] MCR    PT Start Time  0715    PT Stop Time  0755    PT Time Calculation (min)  40 min       Past Medical History:  Diagnosis Date  . AI (aortic insufficiency)   . Chronic kidney disease   . Glaucoma   . Gout   . H/O exercise stress test    Myoview 01/03/12: No scar or ischemia, EF 53%  . Hyperlipidemia   . Hypertension   . Hypertensive cardiomyopathy ([place])    EF previously 35%;  echo 12/2012 EF 55%, mild AI, ascending aorta mildly dilated, mild LAE    Past Surgical History:  Procedure Laterality Date  . COLONOSCOPY    . CYST REMOVAL HAND    . HERNIA REPAIR     Umbilical  . POLYPECTOMY    . WRIST SURGERY     right    There were no vitals filed for this visit.  Subjective Assessment - 12/04/19 0718    Subjective  Rebuilding an engine and I think I over did it. I am sore today.    Currently in Pain?  No/denies   not pai just soreness                       [facility] Adult PT Treatment/Exercise - 12/04/19 0001      Shoulder Exercises: Sidelying   External Rotation  20 reps    External Rotation Limitations  assisted to max ROM then worked on isometric hold and slow eccentris    ABduction  AROM;Right;Left;20 reps      Shoulder Exercises: Standing   External Rotation  20 reps;Both    Theraband Level (Shoulder External Rotation)  Level 4 (Blue)    Internal Rotation  20 reps;Right;Left    Theraband Level (Shoulder Internal Rotation)  Level 4 (Blue)    Flexion Limitations  wall washing x  2 minutes    2# wrist wweights bilateral    Extension  20 reps;Both    Theraband Level (Shoulder Extension)  Level 4 (Blue)    Row  Both;20 reps    Theraband Level (Shoulder Row)  Level 4 (Blue)    Diagonals Limitations  standing scaption AROM bilat x 15    1# dumbbells   Other Standing Exercises  standing  over head press 3# x 10 each , biep curls x 20 6# ,  blue band hor adduction x 20 RT/LT, cabinet reaching 3# top shelf  x 10 each     Other Standing Exercises  punches blue band x 15, cabinet reaching 3# 10 x 2 middle shelf bilateral       Shoulder Exercises: Pulleys   Flexion  2 minutes    Scaption  1 minute      Shoulder Exercises: ROM/Strengthening   UBE (Upper Arm Bike)  L3.5 3 min each way                PT Short  Term Goals - 12/04/19 0744      PT SHORT TERM GOAL #1   Title  He will be independent with intial HEP    Time  4    Period  Weeks    Status  Achieved      PT SHORT TERM GOAL #2   Title  He will be able to lift arms over head RT=LT    Time  4    Period  Weeks    Status  Achieved      PT SHORT TERM GOAL #3   Title  He will overall report decreased pain with normal home activities    Time  4    Period  Weeks    Status  Achieved      PT SHORT TERM GOAL #4   Title  FOTO will improve  to 31% limied    Baseline  35% limited    Time  4    Period  Weeks    Status  On-going        PT Long Term Goals - 12/04/19 0747      PT LONG TERM GOAL #1   Title  He will be independent with all HEP issued    Time  8    Period  Weeks    Status  On-going      PT LONG TERM GOAL #2   Title  He will report able toi use both UE for home tasks with min pain    Baseline  able to use bilat, no pain during    Time  8    Period  Weeks    Status  Achieved      PT LONG TERM GOAL #3   Title  He will report able to lye on LT side without pain for 2-3 hours    Time  8    Period  Weeks    Status  Achieved      PT LONG TERM GOAL #4   Title  He will be able to  lift 3# over headto cabinets at home with no pain    Time  8    Period  Weeks    Status  Achieved      PT LONG TERM GOAL #5   Title  Shoulder strength will improve to 4/5 minimall all groups    Time  8    Period  Weeks    Status  Unable to assess            Plan - 12/04/19 0746    Clinical Impression Statement  Pt is reaching into cabinets in clinic with 3#, top shelf. 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 He notes soreness after use, not during activity. STG# 2 met. LTG# 2,3,4 met. Progressing toward remaining LTGS.    PT Treatment/Interventions  Manual techniques;Therapeutic activities;Patient/family education;Cryotherapy;Moist Heat    PT Next Visit Plan  strength RTC and scapula bilat, gort discharge FOTO score    PT Home Exercise Plan  isometric with band flexion /abduction/ER    Consulted and Agree with Plan of Care  Patient       Patient will benefit from skilled therapeutic intervention in order to improve the following deficits and impairments:     Visit Diagnosis: Chronic right shoulder pain  Muscle weakness (generalized)  Chronic left shoulder pain     Problem List Patient Active Problem List   Diagnosis Date Noted  . Grade I diastolic dysfunction  10/28/2019  . Bradycardia 10/06/2019  . Educated about COVID-19 virus infection 10/06/2019  . Rotator cuff arthropathy of both shoulders 05/29/2019  . Bilateral acromioclavicular joint arthritis 05/29/2019  . Vitamin D deficiency 09/06/2017  . CKD (chronic kidney disease) stage 3, GFR 30-59 ml/min 08/07/2014  . Primary osteoarthritis of left shoulder 06/09/2014  . Insomnia 02/14/2014  . Erectile dysfunction 02/27/2013  . Papular atopic dermatitis 05/16/2012  . Gout 02/16/2012  . Routine general medical examination at a health care facility 02/16/2012  . Hyperlipidemia with target LDL less than 100 02/16/2012  . Aortic regurgitation 02/16/2012  . Colon polyps 02/16/2012  . Essential  hypertension, benign 03/07/2009  . Hypertensive heart disease without heart failure 03/07/2009  . COMBINED HEART FAILURE, CHRONIC 03/07/2009    Dorene Ar , PTA [facility] [address] [place], [place], [address] Phone: [phone]   Fax:  [phone]  Name: John Hogan. MRN: [id number] Date of Birth: 05/21/1951

## 2019-12-09 ENCOUNTER — Ambulatory Visit: Payer: Managed Care, Other (non HMO)

## 2019-12-09 ENCOUNTER — Other Ambulatory Visit: Payer: Self-pay

## 2019-12-09 DIAGNOSIS — M6281 Muscle weakness (generalized): Secondary | ICD-10-CM

## 2019-12-09 DIAGNOSIS — M25511 Pain in right shoulder: Secondary | ICD-10-CM

## 2019-12-09 DIAGNOSIS — M25512 Pain in left shoulder: Secondary | ICD-10-CM

## 2019-12-09 NOTE — Therapy (Signed)
[place], [place], [address] Phone: [phone]   Fax:  [phone]  Physical Therapy Treatment  Patient Details  Name: John Hogan. MRN: [id number] Date of Birth: 1951-03-09 Referring Provider (PT): Lynne Leader, MD   Encounter Date: 12/09/2019   PT End of Session - 12/09/19 0707    Visit Number 14    Number of Visits 16    Date for PT Re-Evaluation 12/13/19    Authorization Type [employer] MCR    PT Start Time 6503    PT Stop Time 0739    PT Time Calculation (min) 40 min    Activity Tolerance Patient tolerated treatment well    Behavior During Therapy [facility] for tasks assessed/performed           Past Medical History:  Diagnosis Date  . AI (aortic insufficiency)   . Chronic kidney disease   . Glaucoma   . Gout   . H/O exercise stress test    Myoview 01/03/12: No scar or ischemia, EF 53%  . Hyperlipidemia   . Hypertension   . Hypertensive cardiomyopathy ([place])    EF previously 35%;  echo 12/2012 EF 55%, mild AI, ascending aorta mildly dilated, mild LAE    Past Surgical History:  Procedure Laterality Date  . COLONOSCOPY    . CYST REMOVAL HAND    . HERNIA REPAIR     Umbilical  . POLYPECTOMY    . WRIST SURGERY     right    There were no vitals filed for this visit.   Subjective Assessment - 12/09/19 0706    Subjective No nrew complaints. No pain only soreness from activity    Currently in Pain? No/denies              [facility] PT Assessment - 12/09/19 0001      Strength   Right Shoulder Flexion 4/5    Right Shoulder ABduction 3/5    Right Shoulder External Rotation 3+/5    Left Shoulder Flexion 4-/5    Left Shoulder ABduction 3+/5    Left Shoulder External Rotation 3-/5                         [facility] Adult PT Treatment/Exercise - 12/09/19 0001      Shoulder Exercises: Sidelying   External Rotation 20 reps    External Rotation Limitations assisted to max ROM then  worked on isometric hold and slow eccentris    ABduction AROM;Right;Left;20 reps      Shoulder Exercises: Standing   External Rotation 20 reps;Both    Theraband Level (Shoulder External Rotation) Level 4 (Blue)    Internal Rotation 20 reps;Right;Left    Theraband Level (Shoulder Internal Rotation) Level 4 (Blue)    Flexion Limitations wall washing x 2 minutes  2# weight on wrist.    Extension 20 reps;Both    Theraband Level (Shoulder Extension) Level 4 (Blue)    Row Both;20 reps    Theraband Level (Shoulder Row) Level 4 (Blue)    Diagonals Limitations standing scaption AROM bilat x 15 1#    Other Standing Exercises standing  over head press 3# x 10 each , biep curls x 20 6# ,  blue band hor adduction x 20 RT/LT, cabinet reaching 3# top shelf  x 10 each     Other Standing Exercises punches blue band x 15, cabinet reaching 3# 10 x 2 upper shelf bilateral  Shoulder Exercises: ROM/Strengthening   UBE (Upper Arm Bike) L3.5 3 min each way                     PT Short Term Goals - 12/04/19 0744      PT SHORT TERM GOAL #1   Title He will be independent with intial HEP    Time 4    Period Weeks    Status Achieved      PT SHORT TERM GOAL #2   Title He will be able to lift arms over head RT=LT    Time 4    Period Weeks    Status Achieved      PT SHORT TERM GOAL #3   Title He will overall report decreased pain with normal home activities    Time 4    Period Weeks    Status Achieved      PT SHORT TERM GOAL #4   Title FOTO will improve  to 31% limied    Baseline 35% limited    Time 4    Period Weeks    Status On-going             PT Long Term Goals - 12/04/19 0747      PT LONG TERM GOAL #1   Title He will be independent with all HEP issued    Time 8    Period Weeks    Status On-going      PT LONG TERM GOAL #2   Title He will report able toi use both UE for home tasks with min pain    Baseline able to use bilat, no pain during    Time 8    Period  Weeks    Status Achieved      PT LONG TERM GOAL #3   Title He will report able to lye on LT side without pain for 2-3 hours    Time 8    Period Weeks    Status Achieved      PT LONG TERM GOAL #4   Title He will be able to lift 3# over headto cabinets at home with no pain    Time 8    Period Weeks    Status Achieved      PT LONG TERM GOAL #5   Title Shoulder strength will improve to 4/5 minimall all groups    Time 8    Period Weeks    Status Unable to assess                 Plan - 12/09/19 0707    Clinical Impression Statement He reports feeling better and asked how ofter to exercise and 2-3x/week depending on daily activity.. Still weak especially with ER. and overhead .    Ready for discharge next visit    PT Treatment/Interventions Manual techniques;Therapeutic activities;Patient/family education;Cryotherapy;Moist Heat    PT Next Visit Plan strength RTC and scapula bilat, gort discharge FOTO score   discharge next session    PT Home Exercise Plan isometric with band flexion /abduction/ER    Consulted and Agree with Plan of Care Patient           Patient will benefit from skilled therapeutic intervention in order to improve the following deficits and impairments:  Pain, Decreased strength  Visit Diagnosis: Muscle weakness (generalized)  Chronic left shoulder pain  Chronic right shoulder pain     Problem List Patient Active Problem List   Diagnosis Date Noted  .  Grade I diastolic dysfunction 54/62/7035  . Bradycardia 10/06/2019  . Educated about COVID-19 virus infection 10/06/2019  . Rotator cuff arthropathy of both shoulders 05/29/2019  . Bilateral acromioclavicular joint arthritis 05/29/2019  . Vitamin D deficiency 09/06/2017  . CKD (chronic kidney disease) stage 3, GFR 30-59 ml/min 08/07/2014  . Primary osteoarthritis of left shoulder 06/09/2014  . Insomnia 02/14/2014  . Erectile dysfunction 02/27/2013  . Papular atopic dermatitis 05/16/2012  .  Gout 02/16/2012  . Routine general medical examination at a health care facility 02/16/2012  . Hyperlipidemia with target LDL less than 100 02/16/2012  . Aortic regurgitation 02/16/2012  . Colon polyps 02/16/2012  . Essential hypertension, benign 03/07/2009  . Hypertensive heart disease without heart failure 03/07/2009  . COMBINED HEART FAILURE, CHRONIC 03/07/2009    Darrel Hoover   PT 12/09/2019, 7:43 AM  [facility] [address] [place], [place], [address] Phone: [phone]   Fax:  [phone]  Name: Raymar Joiner. MRN: [id number] Date of Birth: [birth year]

## 2019-12-12 ENCOUNTER — Other Ambulatory Visit: Payer: Self-pay

## 2019-12-12 ENCOUNTER — Ambulatory Visit: Payer: Managed Care, Other (non HMO)

## 2019-12-12 DIAGNOSIS — M25511 Pain in right shoulder: Secondary | ICD-10-CM | POA: Diagnosis not present

## 2019-12-12 DIAGNOSIS — M25512 Pain in left shoulder: Secondary | ICD-10-CM

## 2019-12-12 DIAGNOSIS — M6281 Muscle weakness (generalized): Secondary | ICD-10-CM

## 2019-12-12 DIAGNOSIS — G8929 Other chronic pain: Secondary | ICD-10-CM

## 2019-12-12 NOTE — Therapy (Signed)
[place], [place], [address] Phone: [phone]   Fax:  [phone]  Physical Therapy Treatment/Discharge  Patient Details  Name: John Hogan. MRN: [id number] Date of Birth: 1951/05/03 Referring Provider (PT): Lynne Leader, MD   Encounter Date: 12/12/2019   PT End of Session - 12/12/19 0705    Visit Number 15    Number of Visits 16    Date for PT Re-Evaluation 12/13/19    Authorization Type [employer] MCR    PT Start Time 0700    PT Stop Time 0740    PT Time Calculation (min) 40 min    Activity Tolerance Patient tolerated treatment well    Behavior During Therapy [facility] for tasks assessed/performed           Past Medical History:  Diagnosis Date  . AI (aortic insufficiency)   . Chronic kidney disease   . Glaucoma   . Gout   . H/O exercise stress test    Myoview 01/03/12: No scar or ischemia, EF 53%  . Hyperlipidemia   . Hypertension   . Hypertensive cardiomyopathy ([place])    EF previously 35%;  echo 12/2012 EF 55%, mild AI, ascending aorta mildly dilated, mild LAE    Past Surgical History:  Procedure Laterality Date  . COLONOSCOPY    . CYST REMOVAL HAND    . HERNIA REPAIR     Umbilical  . POLYPECTOMY    . WRIST SURGERY     right    There were no vitals filed for this visit.   Subjective Assessment - 12/12/19 0706    Subjective No new issues . pleased withprogress . able to do normal activity with min to no pain.    Currently in Pain? No/denies              [facility] PT Assessment - 12/12/19 0001      Observation/Other Assessments   Focus on Therapeutic Outcomes (FOTO)  27% limited      AROM   Right Shoulder Flexion 160 Degrees    Right Shoulder ABduction 160 Degrees    Right Shoulder External Rotation 92 Degrees   < 45 egrees in side lye   Left Shoulder Flexion 140 Degrees    Left Shoulder ABduction 135 Degrees    Left Shoulder External Rotation 90 Degrees   sitting  40 degrees side  lye     Strength   Right Shoulder Flexion 4/5    Right Shoulder ABduction 3+/5    Right Shoulder External Rotation 3+/5   sitting  < 3/5 in sside lye   Left Shoulder Flexion 4-/5    Left Shoulder ABduction 3+/5    Left Shoulder External Rotation 3/5   < 3/5 in side lye                        [facility] Adult PT Treatment/Exercise - 12/12/19 0001      Shoulder Exercises: Sidelying   External Rotation 20 reps    External Rotation Limitations assisted to max ROM then worked on isometric hold and slow eccentris    ABduction AROM;Right;Left;20 reps      Shoulder Exercises: Standing   External Rotation 20 reps;Both    Theraband Level (Shoulder External Rotation) Level 4 (Blue)    Internal Rotation 20 reps;Right;Left    Theraband Level (Shoulder Internal Rotation) Level 4 (Blue)    Flexion Limitations wall washing x 2 minutes  2# weight on wrist.  Extension 20 reps;Both    Theraband Level (Shoulder Extension) Level 4 (Blue)    Row Both;20 reps    Theraband Level (Shoulder Row) Level 4 (Blue)    Diagonals Limitations standing scaption AROM bilat 2x10  2##    Other Standing Exercises standing  over head press 3# x 10 each , biep curls x 20 6# ,  blue band hor adduction x 20 RT/LT, cabinet reaching 3# top shelf  x 10 each     Other Standing Exercises punches blue band x 15, cabinet reaching 3# 10 x 2 upper shelf bilateral    Bicep curls 7# x 20 no prpoblems      Shoulder Exercises: ROM/Strengthening   UBE (Upper Arm Bike) L3.5 3 min each way                     PT Short Term Goals - 12/12/19 1610      PT SHORT TERM GOAL #4   Title FOTO will improve  to 31% limied    Baseline 27% limited    Status Achieved             PT Long Term Goals - 12/12/19 0716      PT LONG TERM GOAL #1   Title He will be independent with all HEP issued    Status Achieved      PT LONG TERM GOAL #2   Title He will report able toi use both UE for home tasks with min pain     Status Achieved      PT LONG TERM GOAL #3   Title He will report able to lye on LT side without pain for 2-3 hours    Status Achieved      PT LONG TERM GOAL #4   Title He will be able to lift 3# over headto cabinets at home with no pain    Status Achieved      PT LONG TERM GOAL #5   Title Shoulder strength will improve to 4/5 minimall all groups    Baseline ER still < 3/5 in side lying    Status Not Met                 Plan - 12/12/19 0705    Clinical Impression Statement Ready for discharge. Goals met except strength gains and FOTO score exceeded expectation.  Doing all normal activity including engine restoration without increased pain.    PT Treatment/Interventions Manual techniques;Therapeutic activities;Patient/family education;Cryotherapy;Moist Heat    PT Next Visit Plan Discharge with HEP    PT Home Exercise Plan isometric with band flexion /abduction/ER    Consulted and Agree with Plan of Care Patient           Patient will benefit from skilled therapeutic intervention in order to improve the following deficits and impairments:  Pain, Decreased strength  Visit Diagnosis: Muscle weakness (generalized)  Chronic left shoulder pain  Chronic right shoulder pain     Problem List Patient Active Problem List   Diagnosis Date Noted  . Grade I diastolic dysfunction 96/09/5407  . Bradycardia 10/06/2019  . Educated about COVID-19 virus infection 10/06/2019  . Rotator cuff arthropathy of both shoulders 05/29/2019  . Bilateral acromioclavicular joint arthritis 05/29/2019  . Vitamin D deficiency 09/06/2017  . CKD (chronic kidney disease) stage 3, GFR 30-59 ml/min 08/07/2014  . Primary osteoarthritis of left shoulder 06/09/2014  . Insomnia 02/14/2014  . Erectile dysfunction 02/27/2013  . Papular atopic dermatitis 05/16/2012  .  Gout 02/16/2012  . Routine general medical examination at a health care facility 02/16/2012  . Hyperlipidemia with target LDL less than  100 02/16/2012  . Aortic regurgitation 02/16/2012  . Colon polyps 02/16/2012  . Essential hypertension, benign 03/07/2009  . Hypertensive heart disease without heart failure 03/07/2009  . COMBINED HEART FAILURE, CHRONIC 03/07/2009    Darrel Hoover  PT 12/12/2019, 7:41 AM  [facility] [address] [place], [place], [address] Phone: [phone]   Fax:  [phone]  Name: John Hogan. MRN: [id number] Date of Birth: Oct 02, 1950 PHYSICAL THERAPY DISCHARGE SUMMARY  Visits from Start of Care: 15  Current functional level related to goals / functional outcomes: See above   Remaining deficits: See above . Weakness   Education / Equipment: HEP  Plan: Patient agrees to discharge.  Patient goals were met. Patient is being discharged due to meeting the stated rehab goals.  ?????

## 2019-12-19 LAB — BASIC METABOLIC PANEL
BUN/Creatinine Ratio: 12 (ref 10–24)
BUN: 23 mg/dL (ref 8–27)
CO2: 25 mmol/L (ref 20–29)
Calcium: 9.4 mg/dL (ref 8.6–10.2)
Chloride: 99 mmol/L (ref 96–106)
Creatinine, Ser: 1.98 mg/dL — ABNORMAL HIGH (ref 0.76–1.27)
GFR calc Af Amer: 38 mL/min/{1.73_m2} — ABNORMAL LOW (ref >59)
GFR calc non Af Amer: 33 mL/min/{1.73_m2} — ABNORMAL LOW (ref >59)
Glucose: 106 mg/dL — ABNORMAL HIGH (ref 65–99)
Potassium: 4.2 mmol/L (ref 3.5–5.2)
Sodium: 138 mmol/L (ref 134–144)

## 2019-12-24 ENCOUNTER — Other Ambulatory Visit: Payer: Self-pay | Admitting: Internal Medicine

## 2019-12-24 ENCOUNTER — Encounter: Payer: Self-pay | Admitting: *Deleted

## 2019-12-24 DIAGNOSIS — I10 Essential (primary) hypertension: Secondary | ICD-10-CM

## 2019-12-24 DIAGNOSIS — I5042 Chronic combined systolic (congestive) and diastolic (congestive) heart failure: Secondary | ICD-10-CM

## 2020-03-11 ENCOUNTER — Other Ambulatory Visit: Payer: Self-pay | Admitting: Internal Medicine

## 2020-03-11 DIAGNOSIS — I10 Essential (primary) hypertension: Secondary | ICD-10-CM

## 2020-03-18 ENCOUNTER — Other Ambulatory Visit: Payer: Self-pay | Admitting: Internal Medicine

## 2020-03-18 DIAGNOSIS — I5042 Chronic combined systolic (congestive) and diastolic (congestive) heart failure: Secondary | ICD-10-CM

## 2020-03-18 DIAGNOSIS — I10 Essential (primary) hypertension: Secondary | ICD-10-CM

## 2020-04-29 ENCOUNTER — Other Ambulatory Visit: Payer: Self-pay | Admitting: Internal Medicine

## 2020-04-29 DIAGNOSIS — F5104 Psychophysiologic insomnia: Secondary | ICD-10-CM

## 2020-05-04 ENCOUNTER — Ambulatory Visit (INDEPENDENT_AMBULATORY_CARE_PROVIDER_SITE_OTHER): Payer: Managed Care, Other (non HMO) | Admitting: Internal Medicine

## 2020-05-04 ENCOUNTER — Encounter: Payer: Self-pay | Admitting: Internal Medicine

## 2020-05-04 ENCOUNTER — Other Ambulatory Visit: Payer: Self-pay

## 2020-05-04 VITALS — BP 160/94 | HR 60 | Temp 98.3°F | Resp 16 | Ht 71.0 in | Wt 230.0 lb

## 2020-05-04 DIAGNOSIS — I10 Essential (primary) hypertension: Secondary | ICD-10-CM | POA: Diagnosis not present

## 2020-05-04 DIAGNOSIS — N1831 Chronic kidney disease, stage 3a: Secondary | ICD-10-CM

## 2020-05-04 DIAGNOSIS — N522 Drug-induced erectile dysfunction: Secondary | ICD-10-CM

## 2020-05-04 DIAGNOSIS — M1000 Idiopathic gout, unspecified site: Secondary | ICD-10-CM

## 2020-05-04 DIAGNOSIS — F5104 Psychophysiologic insomnia: Secondary | ICD-10-CM | POA: Diagnosis not present

## 2020-05-04 DIAGNOSIS — I5042 Chronic combined systolic (congestive) and diastolic (congestive) heart failure: Secondary | ICD-10-CM

## 2020-05-04 LAB — CBC WITH DIFFERENTIAL/PLATELET
Basophils Absolute: 0 10*3/uL (ref 0.0–0.1)
Basophils Relative: 0.9 % (ref 0.0–3.0)
Eosinophils Absolute: 0.3 10*3/uL (ref 0.0–0.7)
Eosinophils Relative: 7.5 % — ABNORMAL HIGH (ref 0.0–5.0)
HCT: 41.1 % (ref 39.0–52.0)
Hemoglobin: 13.7 g/dL (ref 13.0–17.0)
Lymphocytes Relative: 42.2 % (ref 12.0–46.0)
Lymphs Abs: 1.4 10*3/uL (ref 0.7–4.0)
MCHC: 33.2 g/dL (ref 30.0–36.0)
MCV: 93.2 fl (ref 78.0–100.0)
Monocytes Absolute: 0.3 10*3/uL (ref 0.1–1.0)
Monocytes Relative: 9.9 % (ref 3.0–12.0)
Neutro Abs: 1.3 10*3/uL — ABNORMAL LOW (ref 1.4–7.7)
Neutrophils Relative %: 39.5 % — ABNORMAL LOW (ref 43.0–77.0)
Platelets: 186 10*3/uL (ref 150.0–400.0)
RBC: 4.41 Mil/uL (ref 4.22–5.81)
RDW: 14.3 % (ref 11.5–15.5)
WBC: 3.4 10*3/uL — ABNORMAL LOW (ref 4.0–10.5)

## 2020-05-04 LAB — BASIC METABOLIC PANEL
BUN: 24 mg/dL — ABNORMAL HIGH (ref 6–23)
CO2: 30 mEq/L (ref 19–32)
Calcium: 9.1 mg/dL (ref 8.4–10.5)
Chloride: 103 mEq/L (ref 96–112)
Creatinine, Ser: 1.99 mg/dL — ABNORMAL HIGH (ref 0.40–1.50)
GFR: 33.38 mL/min — ABNORMAL LOW (ref >60.00)
Glucose, Bld: 97 mg/dL (ref 70–99)
Potassium: 3.7 mEq/L (ref 3.5–5.1)
Sodium: 140 mEq/L (ref 135–145)

## 2020-05-04 MED ORDER — ENTRESTO 24-26 MG PO TABS: 1.0000 | ORAL_TABLET | Freq: Two times a day (BID) | ORAL | 0 refills | Status: DC

## 2020-05-04 MED ORDER — COLCHICINE 0.6 MG PO TABS: 0.6000 mg | ORAL_TABLET | Freq: Every day | ORAL | 2 refills | Status: DC

## 2020-05-04 MED ORDER — SILDENAFIL CITRATE 20 MG PO TABS: ORAL_TABLET | ORAL | 0 refills | Status: DC

## 2020-05-04 MED ORDER — ZOLPIDEM TARTRATE 5 MG PO TABS: ORAL_TABLET | ORAL | 5 refills | Status: DC

## 2020-05-04 NOTE — Patient Instructions (Signed)

## 2020-05-04 NOTE — Progress Notes (Signed)
Subjective:  Patient ID: John Poffenberger., male    DOB: January 26, 1951  Age: 69 y.o. MRN: [id number]  CC: Hypertension  This visit occurred during the SARS-CoV-2 public health emergency.  Safety protocols were in place, including screening questions prior to the visit, additional usage of staff PPE, and extensive cleaning of exam room while observing appropriate contact time as indicated for disinfecting solutions.    HPI John Hogan. presents for f/up - According to prescription refills it looks like he is no longer taking the ARB.  He tells me he is taking the loop diuretic.  He has not been monitoring his blood pressure.  He denies headache, blurred vision, chest pain, shortness of breath, palpitations, edema, or fatigue.  Outpatient Medications Prior to Visit  Medication Sig Dispense Refill  . Cholecalciferol ([employer] D3) 50 MCG (2000 UT) CAPS Take 1 capsule (2,000 Units total) by mouth daily. 90 capsule 1  . cyclobenzaprine (FLEXERIL) 10 MG tablet TAKE 1 TABLET BY MOUTH THREE TIMES A DAY AS NEEDED FOR MUSCLE SPASMS 90 tablet 1  . pravastatin (PRAVACHOL) 20 MG tablet Take 1 tablet (20 mg total) by mouth daily. 90 tablet 1  . SIMBRINZA 1-0.2 % SUSP INSTILL 1 DROP INTO BOTH EYES TWICE A DAY  3  . timolol (TIMOPTIC) 0.5 % ophthalmic solution PUT 1 DROP IN BOTH EYES TWICE A DAY  3  . Timolol Maleate 0.5 % (DAILY) SOLN Apply 1 drop to eye 2 (two) times daily.    Marland Kitchen torsemide (DEMADEX) 20 MG tablet TAKE 1 TABLET BY MOUTH EVERY DAY 90 tablet 0  . Travoprost, BAK Free, (TRAVATAN) 0.004 % SOLN ophthalmic solution Place 1 drop into both eyes at bedtime.     Marland Kitchen allopurinol (ZYLOPRIM) 300 MG tablet Take 1 tablet (300 mg total) by mouth daily. 90 tablet 1  . colchicine 0.6 MG tablet TAKE 1 TABLET (0.6 MG TOTAL) BY MOUTH DAILY. WHEN HAVING FLARE 5 tablet 2  . losartan (COZAAR) 25 MG tablet Take 2 tablets (50 mg total) by mouth daily. 180 tablet 1  . sildenafil (REVATIO) 20 MG tablet TAKE 4 TABLETS BY MOUTH  DAILY AS NEEDED. 360 tablet 0  . zolpidem (AMBIEN) 5 MG tablet TAKE 1 TABLET BY MOUTH EVERY DAY AT BEDTIME AS NEEDED 30 tablet 5   No facility-administered medications prior to visit.    ROS Review of Systems  Constitutional: Negative for appetite change, diaphoresis, fatigue and unexpected weight change.  HENT: Negative.   Eyes: Negative for visual disturbance.  Respiratory: Negative for cough, chest tightness, shortness of breath and wheezing.   Cardiovascular: Negative for chest pain, palpitations and leg swelling.  Gastrointestinal: Negative for abdominal pain, diarrhea, nausea and vomiting.  Endocrine: Negative.   Genitourinary: Negative.  Negative for difficulty urinating.       +ED  Musculoskeletal: Negative for arthralgias.  Skin: Negative.  Negative for color change.  Neurological: Negative.  Negative for dizziness, weakness and light-headedness.  Hematological: Negative for adenopathy. Does not bruise/bleed easily.  Psychiatric/Behavioral: Positive for sleep disturbance. Negative for decreased concentration and dysphoric mood. The patient is not nervous/anxious.     Objective:  BP (!) 160/94   Pulse 60   Temp 98.3 F (36.8 C) (Oral)   Resp 16   Ht 5\' 11"  (1.803 m)   Wt 230 lb (104.3 kg)   SpO2 98%   BMI 32.08 kg/m   BP Readings from Last 3 Encounters:  05/04/20 (!) 160/94  10/08/19 (!) 130/92  10/07/19  130/80    Wt Readings from Last 3 Encounters:  05/04/20 230 lb (104.3 kg)  10/08/19 226 lb 6.4 oz (102.7 kg)  10/07/19 227 lb (103 kg)    Physical Exam Vitals reviewed.  HENT:     Nose: Nose normal.     Mouth/Throat:     Mouth: Mucous membranes are moist.  Eyes:     General: No scleral icterus.    Conjunctiva/sclera: Conjunctivae normal.  Cardiovascular:     Rate and Rhythm: Normal rate and regular rhythm.     Heart sounds: S1 normal and S2 normal. Murmur heard.  No systolic murmur is present.  Diastolic murmur is present with a grade of 1/4.  No  gallop.   Pulmonary:     Breath sounds: No stridor. No wheezing, rhonchi or rales.  Abdominal:     General: Abdomen is protuberant. Bowel sounds are normal. There is no distension.     Palpations: Abdomen is soft. There is no hepatomegaly, splenomegaly or mass.     Tenderness: There is no abdominal tenderness.  Musculoskeletal:     Cervical back: Neck supple.     Right lower leg: No edema.     Left lower leg: No edema.  Skin:    General: Skin is warm and dry.     Coloration: Skin is not pale.  Neurological:     General: No focal deficit present.  Psychiatric:        Mood and Affect: Mood normal.        Behavior: Behavior normal.     Lab Results  Component Value Date   WBC 3.4 (L) 05/04/2020   HGB 13.7 05/04/2020   HCT 41.1 05/04/2020   PLT 186.0 05/04/2020   GLUCOSE 97 05/04/2020   CHOL 159 10/02/2019   TRIG 55.0 10/02/2019   HDL 64.80 10/02/2019   LDLDIRECT 140.7 02/27/2013   LDLCALC 83 10/02/2019   ALT 11 10/02/2019   AST 13 10/02/2019   NA 140 05/04/2020   K 3.7 05/04/2020   CL 103 05/04/2020   CREATININE 1.99 (H) 05/04/2020   BUN 24 (H) 05/04/2020   CO2 30 05/04/2020   TSH 1.94 10/02/2019   PSA 1.89 10/02/2019    No results found.  Assessment & Plan:   John Hogan was seen today for hypertension.  Diagnoses and all orders for this visit:  Idiopathic gout, unspecified chronicity, unspecified site- He has had no recent gout attacks and his uric acid level was normal earlier this year.  Will continue the current dose of colchicine and allopurinol. -     colchicine 0.6 MG tablet; Take 1 tablet (0.6 mg total) by mouth daily. When having flare -     allopurinol (ZYLOPRIM) 300 MG tablet; Take 1 tablet (300 mg total) by mouth daily.  Drug-induced erectile dysfunction -     sildenafil (REVATIO) 20 MG tablet; TAKE 4 TABLETS BY MOUTH DAILY AS NEEDED.  Psychophysiological insomnia -     zolpidem (AMBIEN) 5 MG tablet; TAKE 1 TABLET BY MOUTH EVERY DAY AT BEDTIME AS  NEEDED  Stage 3a chronic kidney disease ([place])- His renal function has declined some.  He will avoid nephrotoxic agents.  Will work to get better control of his blood pressure. -     Basic metabolic panel; Future -     Basic metabolic panel  COMBINED HEART FAILURE, CHRONIC- He has a normal volume status.  Will restart an ARB and will add on sacubitril to reduce the risk of complications from  CHF. -     sacubitril-valsartan (ENTRESTO) 24-26 MG; Take 1 tablet by mouth 2 (two) times daily.  Essential hypertension, benign- His blood pressure is not adequately well controlled and his renal function has declined.  Will continue the current dose of the loop diuretic.  Will restart the ARB and add on scaubutril. -     CBC with Differential/Platelet; Future -     Basic metabolic panel; Future -     sacubitril-valsartan (ENTRESTO) 24-26 MG; Take 1 tablet by mouth 2 (two) times daily. -     Basic metabolic panel -     CBC with Differential/Platelet   I have discontinued John Starch Jr.'s losartan. I am also having him start on Entresto. Additionally, I am having him maintain his Travoprost (BAK Free), timolol, Simbrinza, Timolol Maleate, CVS D3, pravastatin, cyclobenzaprine, torsemide, colchicine, sildenafil, zolpidem, and allopurinol.  Meds ordered this encounter  Medications  . colchicine 0.6 MG tablet    Sig: Take 1 tablet (0.6 mg total) by mouth daily. When having flare    Dispense:  5 tablet    Refill:  2  . sildenafil (REVATIO) 20 MG tablet    Sig: TAKE 4 TABLETS BY MOUTH DAILY AS NEEDED.    Dispense:  360 tablet    Refill:  0  . zolpidem (AMBIEN) 5 MG tablet    Sig: TAKE 1 TABLET BY MOUTH EVERY DAY AT BEDTIME AS NEEDED    Dispense:  30 tablet    Refill:  5    Not to exceed 2 additional fills before 08/02/2019  . sacubitril-valsartan (ENTRESTO) 24-26 MG    Sig: Take 1 tablet by mouth 2 (two) times daily.    Dispense:  180 tablet    Refill:  0  . allopurinol (ZYLOPRIM) 300 MG tablet      Sig: Take 1 tablet (300 mg total) by mouth daily.    Dispense:  90 tablet    Refill:  1   I spent 50 minutes in preparing to see the patient by review of recent labs, imaging and procedures, obtaining and reviewing separately obtained history, communicating with the patient and family or caregiver, ordering medications, tests or procedures, and documenting clinical information in the EHR including the differential Dx, treatment, and any further evaluation and other management of 1. Drug-induced erectile dysfunction 2. Psychophysiological insomnia 3. Idiopathic gout, unspecified chronicity, unspecified site 4. Stage 3a chronic kidney disease (HCC) 5. COMBINED HEART FAILURE, CHRONIC 6. Essential hypertension, benign    Follow-up: Return in about 6 weeks (around 06/15/2020).  Scarlette Calico, MD

## 2020-05-05 ENCOUNTER — Encounter: Payer: Self-pay | Admitting: Internal Medicine

## 2020-05-05 MED ORDER — ALLOPURINOL 300 MG PO TABS: 300.0000 mg | ORAL_TABLET | Freq: Every day | ORAL | 1 refills | Status: DC

## 2020-05-22 ENCOUNTER — Other Ambulatory Visit: Payer: Self-pay | Admitting: Internal Medicine

## 2020-05-22 DIAGNOSIS — M545 Low back pain, unspecified: Secondary | ICD-10-CM

## 2020-05-28 ENCOUNTER — Other Ambulatory Visit: Payer: Self-pay | Admitting: Internal Medicine

## 2020-05-28 DIAGNOSIS — R21 Rash and other nonspecific skin eruption: Secondary | ICD-10-CM

## 2020-06-03 ENCOUNTER — Other Ambulatory Visit: Payer: Self-pay | Admitting: Internal Medicine

## 2020-06-03 ENCOUNTER — Other Ambulatory Visit: Payer: Self-pay | Admitting: Family

## 2020-06-03 DIAGNOSIS — I5042 Chronic combined systolic (congestive) and diastolic (congestive) heart failure: Secondary | ICD-10-CM

## 2020-06-03 DIAGNOSIS — I10 Essential (primary) hypertension: Secondary | ICD-10-CM

## 2020-06-03 MED ORDER — TORSEMIDE 20 MG PO TABS: 20.0000 mg | ORAL_TABLET | Freq: Every day | ORAL | 0 refills | Status: DC

## 2020-06-03 MED ORDER — ENTRESTO 24-26 MG PO TABS: 1.0000 | ORAL_TABLET | Freq: Two times a day (BID) | ORAL | 0 refills | Status: DC

## 2020-06-05 ENCOUNTER — Other Ambulatory Visit: Payer: Self-pay | Admitting: Internal Medicine

## 2020-06-05 DIAGNOSIS — I5042 Chronic combined systolic (congestive) and diastolic (congestive) heart failure: Secondary | ICD-10-CM

## 2020-06-05 DIAGNOSIS — I10 Essential (primary) hypertension: Secondary | ICD-10-CM

## 2020-06-05 DIAGNOSIS — E559 Vitamin D deficiency, unspecified: Secondary | ICD-10-CM

## 2020-06-06 ENCOUNTER — Other Ambulatory Visit: Payer: Self-pay | Admitting: Internal Medicine

## 2020-06-06 DIAGNOSIS — E559 Vitamin D deficiency, unspecified: Secondary | ICD-10-CM

## 2020-06-06 MED ORDER — CVS D3 50 MCG (2000 UT) PO CAPS: 2000.0000 [IU] | ORAL_CAPSULE | Freq: Every day | ORAL | 1 refills | Status: DC

## 2020-06-08 ENCOUNTER — Telehealth: Payer: Self-pay

## 2020-06-08 NOTE — Telephone Encounter (Addendum)
key: [id number]

## 2020-06-15 ENCOUNTER — Ambulatory Visit: Payer: Managed Care, Other (non HMO) | Admitting: Internal Medicine

## 2020-06-15 DIAGNOSIS — Z0289 Encounter for other administrative examinations: Secondary | ICD-10-CM

## 2020-06-17 NOTE — Telephone Encounter (Signed)
Has to redo PA. First requested PA from [employer] had [employer].

## 2020-06-22 NOTE — Progress Notes (Signed)
   I, John Hogan, LAT, ATC, am serving as scribe for John Hogan.  John Hogan. is a 69 y.o. male who presents to [facility] at [facility] today for possible cyst on L hand. Pt was previously seen by Dr. Georgina Hogan on 10/07/19 for bilat shoulder pain. Today, pt reports bump on L palmar hand appeared approximately 2 weeks ago w/ no known MOI. Pt locates pain to L palmar hand between the 3rd and 4th distal MC.  He reports irritation to this area w/ gripping or pressure to the area.  He had a similar ganglion cyst on the dorsal aspect of his left hand between the second and third MCPs that ultimately required excision in 2017 [facility].  He would like to proceed directly to surgical management for his palmar cyst if possible.  Pertinent review of systems: No fevers or chills  Relevant historical information: Hypertension, CKD   Exam:  BP 122/86 (BP Location: Right Arm, Patient Position: Sitting, Cuff Size: Large)   Ht 5\' 11"  (1.803 m)   Wt 230 lb 6.4 oz (104.5 kg)   BMI 32.13 kg/m  General: Well Developed, well nourished, and in no acute distress.   MSK: Left hand small nodule palpated at palmar aspect John palm between third and fourth metacarpals.  Mildly tender to palpation.  Normal grip strength and motion.  Pulses cap refill and sensation are intact distally.    Lab and Radiology Results Diagnostic Limited MSK Ultrasound of: Left hand palmar mass Small hypoechoic cystic structure palmar hand associated flexor tendon consistent with small ganglion cyst.  Measures 0.2 x 0.5 cm. Impression: Small palmar ganglion cyst.      Assessment and Plan: 69 y.o. male with small palmar ganglion cyst left hand.  This is problematic as it interferes with his ability to grip an object without pain.  Normally next step for management this problem would be trial of aspiration and injection in clinic.  However patient has experience with a similar issue on the dorsal  aspect of his hand at 4 years ago that ultimately required surgery.  He would like to proceed directly to surgery if possible.  I think this is reasonable.  Will refer back to his orthopedic surgeon Dr. Marlou Hogan who performed the last excision.   PDMP not reviewed this encounter. Orders Placed This Encounter  Procedures  . [place] LIMITED JOINT SPACE STRUCTURES UP LEFT(NO LINKED CHARGES)    Order Specific Question:   Reason for Exam (SYMPTOM  OR DIAGNOSIS REQUIRED)    Answer:   L hand pain    Order Specific Question:   Preferred imaging location?    Answer:   [place]  . Ambulatory referral to Orthopedic Surgery    Referral Priority:   Routine    Referral Type:   Surgical    Referral Reason:   Specialty Services Required    Requested Specialty:   Orthopedic Surgery    Number of Visits Requested:   1   No orders of the defined types were placed in this encounter.    Discussed warning signs or symptoms. Please see discharge instructions. Patient expresses understanding.   The above documentation has been reviewed and is accurate and complete John Hogan, M.D.

## 2020-06-23 ENCOUNTER — Ambulatory Visit (INDEPENDENT_AMBULATORY_CARE_PROVIDER_SITE_OTHER): Payer: Managed Care, Other (non HMO) | Admitting: Family Medicine

## 2020-06-23 ENCOUNTER — Encounter: Payer: Self-pay | Admitting: Family Medicine

## 2020-06-23 ENCOUNTER — Ambulatory Visit: Payer: Self-pay

## 2020-06-23 ENCOUNTER — Other Ambulatory Visit: Payer: Self-pay

## 2020-06-23 VITALS — BP 122/86 | Ht 71.0 in | Wt 230.4 lb

## 2020-06-23 DIAGNOSIS — M6740 Ganglion, unspecified site: Secondary | ICD-10-CM | POA: Diagnosis not present

## 2020-06-23 DIAGNOSIS — M79642 Pain in left hand: Secondary | ICD-10-CM

## 2020-06-23 NOTE — Patient Instructions (Signed)
Thank you for coming in today.  You should hear from orthopedic office soon about referral.  You saw Dr Marlou Sa for this last time.   Recheck with me as needed.   Happy to do an injection at any time for this.

## 2020-06-30 ENCOUNTER — Other Ambulatory Visit: Payer: Self-pay

## 2020-06-30 ENCOUNTER — Ambulatory Visit (INDEPENDENT_AMBULATORY_CARE_PROVIDER_SITE_OTHER): Payer: Managed Care, Other (non HMO) | Admitting: Orthopaedic Surgery

## 2020-06-30 ENCOUNTER — Encounter: Payer: Self-pay | Admitting: Orthopaedic Surgery

## 2020-06-30 DIAGNOSIS — R2232 Localized swelling, mass and lump, left upper limb: Secondary | ICD-10-CM

## 2020-06-30 NOTE — Progress Notes (Signed)
Office Visit Note   Patient: John Hogan.           Date of Birth: 1950-12-27           MRN: [id number] Visit Date: 06/30/2020              Requested by: Janith Lima, MD [address] [place],  [place] [address] PCP: Janith Lima, MD   Assessment & Plan: Visit Diagnoses:  1. Mass of left hand     Plan:  Due to the location of the mass being on the volar aspect of the hand near the superficial palmar arch region recommend referral to hand specialist for evaluation and treatment.  Patient requests Dr. Gaynelle Arabian, [facility] as he has seen him in the past.  We will make this referral for him.  Questions were encouraged and answered by Dr. Ninfa Linden myself.   Follow-Up Instructions: Return if symptoms worsen or fail to improve.   Orders:  No orders of the defined types were placed in this encounter.  No orders of the defined types were placed in this encounter.     Procedures: No procedures performed   Clinical Data: No additional findings.   Subjective: Chief Complaint  Patient presents with  . Left Hand - Pain    HPI Patient is a 70 year old male comes in today with a left hand cyst that has been present for the last few months.  He has a history of a ganglion cyst removed by Dr. Marlou Sa in 2017.  Recently saw Dr. Georgina Snell who ultrasounded the area and found a hypoechoic cystic structure of the leftHand associated with flexor tendon tendon consistent with a ganglion cyst.  Measurement was 0.2 x 0.5 cm.  He is referred here today for treatment.  He states that the cyst is painful when gripping an object he rates his pain to be 7 out of 10 pain at worst.  He denies any triggering of the fingers of the hand.  Denies any numbness tingling in the hand. Review of Systems See HPI otherwise negative or noncontributory.  Objective: Vital Signs: There were no vitals taken for this visit.  Physical Exam General: Well-developed well-nourished male no acute distress mood affect  appropriate. Ortho Exam Left hand he has full sensation full motor.  There is no active triggering of the fingers particularly the third and fourth.  Mobile small mass palmar aspect of the hand between the third and fourth metacarpal region.  Masses tender to palpation.  No other masses, erythema, ecchymosis or skin rashes present left hand.  Specialty Comments:  No specialty comments available.  Imaging: No results found.   PMFS History: Patient Active Problem List   Diagnosis Date Noted  . Grade I diastolic dysfunction 56/38/7564  . Bradycardia 10/06/2019  . Rotator cuff arthropathy of both shoulders 05/29/2019  . Bilateral acromioclavicular joint arthritis 05/29/2019  . Vitamin D deficiency 09/06/2017  . CKD (chronic kidney disease) stage 3, GFR 30-59 ml/min (HCC) 08/07/2014  . Primary osteoarthritis of left shoulder 06/09/2014  . Insomnia 02/14/2014  . Erectile dysfunction 02/27/2013  . Papular atopic dermatitis 05/16/2012  . Gout 02/16/2012  . Routine general medical examination at a health care facility 02/16/2012  . Hyperlipidemia with target LDL less than 100 02/16/2012  . Aortic regurgitation 02/16/2012  . Colon polyps 02/16/2012  . Essential hypertension, benign 03/07/2009  . Hypertensive heart disease without heart failure 03/07/2009  . COMBINED HEART FAILURE, CHRONIC 03/07/2009   Past Medical History:  Diagnosis Date  . AI (aortic insufficiency)   . Chronic kidney disease   . Glaucoma   . Gout   . H/O exercise stress test    Myoview 01/03/12: No scar or ischemia, EF 53%  . Hyperlipidemia   . Hypertension   . Hypertensive cardiomyopathy ([place])    EF previously 35%;  echo 12/2012 EF 55%, mild AI, ascending aorta mildly dilated, mild LAE    Family History  Problem Relation Age of Onset  . Hypertension Father   . Colon cancer Father 34  . Hyperlipidemia Father   . Heart disease Father   . Diabetes Father   . Stroke Mother        deceased  . Colon cancer  Mother 1  . Arthritis Mother   . Hyperlipidemia Mother   . Hypertension Mother   . Diabetes Mother   . Colon polyps Sister   . Colon polyps Brother   . COPD Neg Hx   . Alcohol abuse Neg Hx   . Kidney disease Neg Hx   . Esophageal cancer Neg Hx   . Rectal cancer Neg Hx   . Stomach cancer Neg Hx     Past Surgical History:  Procedure Laterality Date  . COLONOSCOPY    . CYST REMOVAL HAND    . HERNIA REPAIR     Umbilical  . POLYPECTOMY    . WRIST SURGERY     right   Social History   Occupational History    Employer: [employer]  Tobacco Use  . Smoking status: Former Smoker    Packs/day: 1.00    Years: 2.00    Pack years: 2.00    Types: Cigarettes    Quit date: 01/10/1975    Years since quitting: 45.5  . Smokeless tobacco: Never Used  . Tobacco comment: Only smoked two years  Vaping Use  . Vaping Use: Never used  Substance and Sexual Activity  . Alcohol use: No  . Drug use: No  . Sexual activity: Yes

## 2020-07-16 ENCOUNTER — Other Ambulatory Visit: Payer: Self-pay | Admitting: Orthopedic Surgery

## 2020-07-23 ENCOUNTER — Other Ambulatory Visit: Payer: Self-pay | Admitting: Internal Medicine

## 2020-07-23 DIAGNOSIS — M545 Low back pain, unspecified: Secondary | ICD-10-CM

## 2020-07-28 ENCOUNTER — Encounter (HOSPITAL_BASED_OUTPATIENT_CLINIC_OR_DEPARTMENT_OTHER): Payer: Self-pay | Admitting: Orthopedic Surgery

## 2020-07-28 NOTE — Progress Notes (Signed)
Pt stated he had an at home rapid +COVID test on 06/07/20. He does not have any documentation of this test result. Per our guidelines he will need to be re tested for surgery. Explained that he potentially will come back positive since it is within the 90 day window. Pt verbalized understanding.

## 2020-07-29 ENCOUNTER — Encounter (HOSPITAL_BASED_OUTPATIENT_CLINIC_OR_DEPARTMENT_OTHER)
Admission: RE | Admit: 2020-07-29 | Discharge: 2020-07-29 | Disposition: A | Discharge status: Home / Self Care | Payer: Managed Care, Other (non HMO) | Source: Ambulatory Visit | Attending: Orthopedic Surgery | Admitting: Orthopedic Surgery

## 2020-07-29 DIAGNOSIS — Z01812 Encounter for preprocedural laboratory examination: Secondary | ICD-10-CM | POA: Insufficient documentation

## 2020-07-29 LAB — BASIC METABOLIC PANEL
Anion gap: 11 (ref 5–15)
BUN: 25 mg/dL — ABNORMAL HIGH (ref 8–23)
CO2: 27 mmol/L (ref 22–32)
Calcium: 9.3 mg/dL (ref 8.9–10.3)
Chloride: 102 mmol/L (ref 98–111)
Creatinine, Ser: 2.05 mg/dL — ABNORMAL HIGH (ref 0.61–1.24)
GFR, Estimated: 34 mL/min — ABNORMAL LOW (ref >60)
Glucose, Bld: 98 mg/dL (ref 70–99)
Potassium: 3.7 mmol/L (ref 3.5–5.1)
Sodium: 140 mmol/L (ref 135–145)

## 2020-07-29 NOTE — Progress Notes (Signed)

## 2020-07-30 ENCOUNTER — Other Ambulatory Visit (HOSPITAL_COMMUNITY): Payer: Managed Care, Other (non HMO)

## 2020-07-31 ENCOUNTER — Other Ambulatory Visit (HOSPITAL_COMMUNITY)
Admission: RE | Admit: 2020-07-31 | Discharge: 2020-07-31 | Disposition: A | Discharge status: Home / Self Care | Payer: Managed Care, Other (non HMO) | Source: Ambulatory Visit | Attending: Orthopedic Surgery | Admitting: Orthopedic Surgery

## 2020-07-31 DIAGNOSIS — Z20822 Contact with and (suspected) exposure to covid-19: Secondary | ICD-10-CM | POA: Diagnosis not present

## 2020-07-31 DIAGNOSIS — Z01812 Encounter for preprocedural laboratory examination: Secondary | ICD-10-CM | POA: Insufficient documentation

## 2020-07-31 LAB — SARS CORONAVIRUS 2 (TAT 6-24 HRS): SARS Coronavirus 2: NEGATIVE

## 2020-08-02 ENCOUNTER — Other Ambulatory Visit: Payer: Self-pay | Admitting: Internal Medicine

## 2020-08-02 DIAGNOSIS — R21 Rash and other nonspecific skin eruption: Secondary | ICD-10-CM

## 2020-08-04 ENCOUNTER — Ambulatory Visit (HOSPITAL_BASED_OUTPATIENT_CLINIC_OR_DEPARTMENT_OTHER): Payer: Managed Care, Other (non HMO) | Admitting: Anesthesiology

## 2020-08-04 ENCOUNTER — Encounter (HOSPITAL_BASED_OUTPATIENT_CLINIC_OR_DEPARTMENT_OTHER): Payer: Self-pay | Admitting: Orthopedic Surgery

## 2020-08-04 ENCOUNTER — Other Ambulatory Visit: Payer: Self-pay

## 2020-08-04 ENCOUNTER — Encounter (HOSPITAL_BASED_OUTPATIENT_CLINIC_OR_DEPARTMENT_OTHER)
Admission: RE | Disposition: A | Discharge status: Home / Self Care | Payer: Self-pay | Source: Home / Self Care | Attending: Orthopedic Surgery

## 2020-08-04 ENCOUNTER — Ambulatory Visit (HOSPITAL_BASED_OUTPATIENT_CLINIC_OR_DEPARTMENT_OTHER)
Admission: RE | Admit: 2020-08-04 | Discharge: 2020-08-04 | Disposition: A | Discharge status: Home / Self Care | Payer: Managed Care, Other (non HMO) | Attending: Orthopedic Surgery | Admitting: Orthopedic Surgery

## 2020-08-04 DIAGNOSIS — Z833 Family history of diabetes mellitus: Secondary | ICD-10-CM | POA: Diagnosis not present

## 2020-08-04 DIAGNOSIS — Z8349 Family history of other endocrine, nutritional and metabolic diseases: Secondary | ICD-10-CM | POA: Diagnosis not present

## 2020-08-04 DIAGNOSIS — Z8249 Family history of ischemic heart disease and other diseases of the circulatory system: Secondary | ICD-10-CM | POA: Insufficient documentation

## 2020-08-04 DIAGNOSIS — Z87891 Personal history of nicotine dependence: Secondary | ICD-10-CM | POA: Diagnosis not present

## 2020-08-04 DIAGNOSIS — I509 Heart failure, unspecified: Secondary | ICD-10-CM | POA: Insufficient documentation

## 2020-08-04 DIAGNOSIS — Z8371 Family history of colonic polyps: Secondary | ICD-10-CM | POA: Insufficient documentation

## 2020-08-04 DIAGNOSIS — Z79899 Other long term (current) drug therapy: Secondary | ICD-10-CM | POA: Insufficient documentation

## 2020-08-04 DIAGNOSIS — M67442 Ganglion, left hand: Secondary | ICD-10-CM | POA: Insufficient documentation

## 2020-08-04 DIAGNOSIS — Z823 Family history of stroke: Secondary | ICD-10-CM | POA: Insufficient documentation

## 2020-08-04 DIAGNOSIS — I130 Hypertensive heart and chronic kidney disease with heart failure and stage 1 through stage 4 chronic kidney disease, or unspecified chronic kidney disease: Secondary | ICD-10-CM | POA: Diagnosis not present

## 2020-08-04 DIAGNOSIS — Z800 Family history of malignant neoplasm of digestive organs: Secondary | ICD-10-CM | POA: Diagnosis not present

## 2020-08-04 DIAGNOSIS — N189 Chronic kidney disease, unspecified: Secondary | ICD-10-CM | POA: Diagnosis not present

## 2020-08-04 HISTORY — PX: MASS EXCISION: SHX2000

## 2020-08-04 HISTORY — DX: Heart failure, unspecified: I50.9

## 2020-08-04 SURGERY — EXCISION MASS
Anesthesia: Regional | Site: Hand | Laterality: Left

## 2020-08-04 MED ORDER — BUPIVACAINE HCL (PF) 0.25 % IJ SOLN
INTRAMUSCULAR | Status: DC | PRN
  Administered 2020-08-04: 6 mL

## 2020-08-04 MED ORDER — PROPOFOL 10 MG/ML IV BOLUS
INTRAVENOUS | Status: DC | PRN
  Administered 2020-08-04: 200 mg via INTRAVENOUS

## 2020-08-04 MED ORDER — LACTATED RINGERS IV SOLN: INTRAVENOUS | Status: DC

## 2020-08-04 MED ORDER — DEXAMETHASONE SODIUM PHOSPHATE 10 MG/ML IJ SOLN
INTRAMUSCULAR | Status: DC | PRN
  Administered 2020-08-04: 4 mg via INTRAVENOUS

## 2020-08-04 MED ORDER — BUPIVACAINE HCL (PF) 0.25 % IJ SOLN
INTRAMUSCULAR | Status: AC
  Filled 2020-08-04: qty 30

## 2020-08-04 MED ORDER — LIDOCAINE 2% (20 MG/ML) 5 ML SYRINGE
INTRAMUSCULAR | Status: AC
  Filled 2020-08-04: qty 5

## 2020-08-04 MED ORDER — CEFAZOLIN SODIUM-DEXTROSE 2-4 GM/100ML-% IV SOLN
2.0000 g | INTRAVENOUS | Status: AC
  Administered 2020-08-04: 2 g via INTRAVENOUS

## 2020-08-04 MED ORDER — DEXAMETHASONE SODIUM PHOSPHATE 10 MG/ML IJ SOLN
INTRAMUSCULAR | Status: AC
  Filled 2020-08-04: qty 1

## 2020-08-04 MED ORDER — EPHEDRINE 5 MG/ML INJ
INTRAVENOUS | Status: AC
  Filled 2020-08-04: qty 10

## 2020-08-04 MED ORDER — FENTANYL CITRATE (PF) 100 MCG/2ML IJ SOLN
INTRAMUSCULAR | Status: AC
  Filled 2020-08-04: qty 2

## 2020-08-04 MED ORDER — EPHEDRINE SULFATE 50 MG/ML IJ SOLN
INTRAMUSCULAR | Status: DC | PRN
  Administered 2020-08-04: 10 mg via INTRAVENOUS
  Administered 2020-08-04: 5 mg via INTRAVENOUS

## 2020-08-04 MED ORDER — FENTANYL CITRATE (PF) 100 MCG/2ML IJ SOLN
INTRAMUSCULAR | Status: DC | PRN
  Administered 2020-08-04 (×2): 25 ug via INTRAVENOUS

## 2020-08-04 MED ORDER — OXYCODONE HCL 5 MG PO TABS
5.0000 mg | ORAL_TABLET | Freq: Once | ORAL | Status: AC | PRN
  Administered 2020-08-04: 5 mg via ORAL

## 2020-08-04 MED ORDER — 0.9 % SODIUM CHLORIDE (POUR BTL) OPTIME
TOPICAL | Status: DC | PRN
  Administered 2020-08-04: 120 mL

## 2020-08-04 MED ORDER — FENTANYL CITRATE (PF) 100 MCG/2ML IJ SOLN: 25.0000 ug | INTRAMUSCULAR | Status: DC | PRN

## 2020-08-04 MED ORDER — TRAMADOL HCL 50 MG PO TABS: 50.0000 mg | ORAL_TABLET | Freq: Four times a day (QID) | ORAL | 0 refills | Status: DC | PRN

## 2020-08-04 MED ORDER — ONDANSETRON HCL 4 MG/2ML IJ SOLN
INTRAMUSCULAR | Status: AC
  Filled 2020-08-04: qty 2

## 2020-08-04 MED ORDER — ONDANSETRON HCL 4 MG/2ML IJ SOLN
INTRAMUSCULAR | Status: DC | PRN
  Administered 2020-08-04: 4 mg via INTRAVENOUS

## 2020-08-04 MED ORDER — ONDANSETRON HCL 4 MG/2ML IJ SOLN: 4.0000 mg | Freq: Once | INTRAMUSCULAR | Status: DC | PRN

## 2020-08-04 MED ORDER — OXYCODONE HCL 5 MG/5ML PO SOLN: 5.0000 mg | Freq: Once | ORAL | Status: AC | PRN

## 2020-08-04 MED ORDER — LIDOCAINE HCL (CARDIAC) PF 100 MG/5ML IV SOSY
PREFILLED_SYRINGE | INTRAVENOUS | Status: DC | PRN
  Administered 2020-08-04: 100 mg via INTRAVENOUS

## 2020-08-04 MED ORDER — OXYCODONE HCL 5 MG PO TABS
ORAL_TABLET | ORAL | Status: AC
  Filled 2020-08-04: qty 1

## 2020-08-04 SURGICAL SUPPLY — 41 items
BLADE SURG 15 STRL LF DISP TIS (BLADE) ×1 IMPLANT
BLADE SURG 15 STRL SS (BLADE) ×2
BNDG COHESIVE 1X5 TAN STRL LF (GAUZE/BANDAGES/DRESSINGS) IMPLANT
BNDG COHESIVE 2X5 TAN STRL LF (GAUZE/BANDAGES/DRESSINGS) IMPLANT
BNDG COHESIVE 3X5 TAN STRL LF (GAUZE/BANDAGES/DRESSINGS) IMPLANT
BNDG ESMARK 4X9 LF (GAUZE/BANDAGES/DRESSINGS) ×2 IMPLANT
BNDG GAUZE ELAST 4 BULKY (GAUZE/BANDAGES/DRESSINGS) ×2 IMPLANT
CHLORAPREP W/TINT 26 (MISCELLANEOUS) ×2 IMPLANT
CORD BIPOLAR FORCEPS 12FT (ELECTRODE) ×2 IMPLANT
COVER BACK TABLE 60X90IN (DRAPES) ×2 IMPLANT
COVER MAYO STAND STRL (DRAPES) ×2 IMPLANT
COVER WAND RF STERILE (DRAPES) IMPLANT
CUFF TOURN SGL QUICK 18X4 (TOURNIQUET CUFF) ×2 IMPLANT
DECANTER SPIKE VIAL GLASS SM (MISCELLANEOUS) IMPLANT
DRAIN PENROSE 1/2X12 LTX STRL (WOUND CARE) IMPLANT
DRAPE EXTREMITY T 121X128X90 (DISPOSABLE) ×2 IMPLANT
DRAPE SURG 17X23 STRL (DRAPES) ×2 IMPLANT
GAUZE SPONGE 4X4 12PLY STRL (GAUZE/BANDAGES/DRESSINGS) ×2 IMPLANT
GAUZE XEROFORM 1X8 LF (GAUZE/BANDAGES/DRESSINGS) ×2 IMPLANT
GLOVE SURG ORTHO LTX SZ8 (GLOVE) ×2 IMPLANT
GLOVE SURG UNDER POLY LF SZ8.5 (GLOVE) ×2 IMPLANT
GOWN STRL REUS W/ TWL LRG LVL3 (GOWN DISPOSABLE) ×2 IMPLANT
GOWN STRL REUS W/TWL LRG LVL3 (GOWN DISPOSABLE) ×4
GOWN STRL REUS W/TWL XL LVL3 (GOWN DISPOSABLE) ×2 IMPLANT
NDL SAFETY ECLIPSE 18X1.5 (NEEDLE) IMPLANT
NEEDLE HYPO 18GX1.5 SHARP (NEEDLE)
NEEDLE PRECISIONGLIDE 27X1.5 (NEEDLE) ×2 IMPLANT
NS IRRIG 1000ML POUR BTL (IV SOLUTION) ×2 IMPLANT
PACK BASIN DAY SURGERY FS (CUSTOM PROCEDURE TRAY) ×2 IMPLANT
PAD CAST 3X4 CTTN HI CHSV (CAST SUPPLIES) IMPLANT
PADDING CAST COTTON 3X4 STRL (CAST SUPPLIES)
SLEEVE SCD COMPRESS KNEE MED (MISCELLANEOUS) ×2 IMPLANT
SPLINT PLASTER CAST XFAST 3X15 (CAST SUPPLIES) IMPLANT
SPLINT PLASTER XTRA FASTSET 3X (CAST SUPPLIES)
STOCKINETTE 4X48 STRL (DRAPES) ×2 IMPLANT
SUT ETHILON 4 0 PS 2 18 (SUTURE) ×2 IMPLANT
SUT VIC AB 4-0 P2 18 (SUTURE) IMPLANT
SYR BULB EAR ULCER 3OZ GRN STR (SYRINGE) ×2 IMPLANT
SYR CONTROL 10ML LL (SYRINGE) ×2 IMPLANT
TOWEL GREEN STERILE FF (TOWEL DISPOSABLE) ×2 IMPLANT
UNDERPAD 30X36 HEAVY ABSORB (UNDERPADS AND DIAPERS) ×2 IMPLANT

## 2020-08-04 NOTE — H&P (Signed)
John Hogan. is an 70 y.o. male.   Chief Complaint: mass left palm HPI: John Hogan is a 70 year old left hand dominant male referred by Dr. Ninfa Linden for consultation regarding a mass in his left palm. This appeared present for 4 to 5 months he recalls no history of injury it is painful if he hits the area. He has not had any treatment for this. He has had an ultrasound done which reveals a 0.2 x 0.5 cm ganglion cyst associated with the flexor tendons. He has not had any treatment or tried anything for this. He states hitting it makes it worse otherwise he does not notice noted there. He has a had a cyst removed from the intermetacarpal space dorsally on that same left hand middle ring finger. This was done by Dr. Marlou Sa. This was done in 2017. He has no history of diabetes thyroid problems he does have a history of arthritis and history of gout. Family history is positive diabetes. He has been tested for this.    Past Medical History:  Diagnosis Date  . AI (aortic insufficiency)   . CHF (congestive heart failure) ([place])   . Chronic kidney disease   . Glaucoma   . Gout   . H/O exercise stress test    Myoview 01/03/12: No scar or ischemia, EF 53%  . Hyperlipidemia   . Hypertension   . Hypertensive cardiomyopathy ([place])    EF previously 35%;  echo 12/2012 EF 55%, mild AI, ascending aorta mildly dilated, mild LAE    Past Surgical History:  Procedure Laterality Date  . COLONOSCOPY    . CYST REMOVAL HAND    . HERNIA REPAIR     Umbilical  . POLYPECTOMY    . WRIST SURGERY     right    Family History  Problem Relation Age of Onset  . Hypertension Father   . Colon cancer Father 98  . Hyperlipidemia Father   . Heart disease Father   . Diabetes Father   . Stroke Mother        deceased  . Colon cancer Mother 55  . Arthritis Mother   . Hyperlipidemia Mother   . Hypertension Mother   . Diabetes Mother   . Colon polyps Sister   . Colon polyps Brother   . COPD Neg Hx   . Alcohol abuse Neg  Hx   . Kidney disease Neg Hx   . Esophageal cancer Neg Hx   . Rectal cancer Neg Hx   . Stomach cancer Neg Hx    Social History:  reports that he quit smoking about 45 years ago. His smoking use included cigarettes. He has a 2.00 pack-year smoking history. He has never used smokeless tobacco. He reports that he does not drink alcohol and does not use drugs.  Allergies: No Known Allergies  Medications Prior to Admission  Medication Sig Dispense Refill  . allopurinol (ZYLOPRIM) 300 MG tablet Take 1 tablet (300 mg total) by mouth daily. 90 tablet 1  . Cholecalciferol (CVS D3) 50 MCG (2000 UT) CAPS Take 1 capsule (2,000 Units total) by mouth daily. 90 capsule 1  . colchicine 0.6 MG tablet Take 1 tablet (0.6 mg total) by mouth daily. When having flare 5 tablet 2  . cyclobenzaprine (FLEXERIL) 10 MG tablet TAKE 1 TABLET BY MOUTH THREE TIMES A DAY AS NEEDED FOR MUSCLE SPASMS 90 tablet 1  . pravastatin (PRAVACHOL) 20 MG tablet Take 1 tablet (20 mg total) by mouth daily. 90 tablet 1  .  sacubitril-valsartan (ENTRESTO) 24-26 MG Take 1 tablet by mouth 2 (two) times daily. 180 tablet 0  . sildenafil (REVATIO) 20 MG tablet TAKE 4 TABLETS BY MOUTH DAILY AS NEEDED. 360 tablet 0  . SIMBRINZA 1-0.2 % SUSP INSTILL 1 DROP INTO BOTH EYES TWICE A DAY  3  . timolol (TIMOPTIC) 0.5 % ophthalmic solution PUT 1 DROP IN BOTH EYES TWICE A DAY  3  . Timolol Maleate 0.5 % (DAILY) SOLN Apply 1 drop to eye 2 (two) times daily.    Marland Kitchen torsemide (DEMADEX) 20 MG tablet Take 1 tablet (20 mg total) by mouth daily. 90 tablet 0  . Travoprost, BAK Free, (TRAVATAN) 0.004 % SOLN ophthalmic solution Place 1 drop into both eyes at bedtime.     Marland Kitchen zolpidem (AMBIEN) 5 MG tablet TAKE 1 TABLET BY MOUTH EVERY DAY AT BEDTIME AS NEEDED 30 tablet 5  . triamcinolone (KENALOG) 0.1 % APPLY TO AFFECTED AREA TWICE A DAY 30 g 1    No results found for this or any previous visit (from the past 48 hour(s)).  No results found.   Pertinent items are  noted in HPI.  Blood pressure 116/76, pulse 70, temperature 97.9 F (36.6 C), temperature source Oral, resp. rate 18, height 5\' 11"  (1.803 m), weight 101.2 kg, SpO2 100 %.  General appearance: alert, cooperative and appears stated age Head: Normocephalic, without obvious abnormality Neck: no JVD Resp: clear to auscultation bilaterally Cardio: regular rate and rhythm, S1, S2 normal, no murmur, click, rub or gallop GI: soft, non-tender; bowel sounds normal; no masses,  no organomegaly Extremities: mass left palm Pulses: 2+ and symmetric Skin: Skin color, texture, turgor normal. No rashes or lesions Neurologic: Grossly normal Incision/Wound: na  Assessment/Plan Assessment:  1. Ganglion cyst of tendon sheath of left hand Left palm   Plan: He would like to have it removed. Preperi-and postoperative course been discussed along with risk complications. He is aware there is no guarantee to the surgery the possibility of infection recurrence injury to arteries nerves tendons incomplete relief symptoms and dystrophy. He is scheduled for excision mass of left palm as an outpatient under regional anesthesia. Dr. Trevor Mace notes are reviewed.    John Hogan 08/04/2020, 10:21 AM

## 2020-08-04 NOTE — Op Note (Signed)
NAME: John Hogan. MEDICAL RECORD NO: [id number] DATE OF BIRTH: [birth year] FACILITY: [facility] LOCATION: [facility] PHYSICIAN: Wynonia Sours, MD   OPERATIVE REPORT   DATE OF PROCEDURE: 08/04/20    PREOPERATIVE DIAGNOSIS:   Mass left palm   POSTOPERATIVE DIAGNOSIS:   Same   PROCEDURE:   Excision mass left palm   SURGEON: Daryll Brod, M.D.   ASSISTANT: none   ANESTHESIA:  General and Local   INTRAVENOUS FLUIDS:  Per anesthesia flow sheet.   ESTIMATED BLOOD LOSS:  Minimal.   COMPLICATIONS:  None.   SPECIMENS:    mass 7 x 5 mm   TOURNIQUET TIME:    Total Tourniquet Time Documented: Forearm (Left) - 12 minutes Total: Forearm (Left) - 12 minutes    DISPOSITION:  Stable to PACU.   INDICATIONS: Patient is a 70 year old male with a mass in his left palm which is painful for him he is desires to get excised.  Prepare postoperative course been discussed along with risk complications.  He is aware that there is no guarantee to surgery the possibility of infection recurrence injury to arteries nerves tendons complete relief symptoms dystrophy.  In preoperative area the patient seen extremity marked by palpation surgeon antibiotic given  OPERATIVE COURSE: Patient is brought to the operating room where general anesthetic was carried out and medicated IV could not be established in his operative arm.  He was prepped using ChloraPrep.  A 3-minute dry time was allowed timeout taken to confirm patient procedure.  An oblique incision was made over the mass in the left palm the middle finger carried down through subcutaneous tissue with a mass was immediately encountered.  This was dissected free with blunt sharp dissection all the down through the palmar fascia and no further lesion was identified.  The opening area was then cauterized.  The specimen was sent to pathology measured 7 x 5 mm.  Wound was irrigated and closed with interrupted 4-0 nylon sutures.  Sterile  compressive dressing was applied.  Inflation of the tourniquet all fingers immediately pink.  He was taken to the recovery room for observation in satisfactory condition.  He will be discharged home to return to the [facility] in 1 week Tylenol ibuprofen for pain with Ultram for breakthrough.   Daryll Brod, MD Electronically signed, 08/04/20

## 2020-08-04 NOTE — Anesthesia Preprocedure Evaluation (Addendum)
Anesthesia Evaluation  Patient identified by MRN, date of birth, ID band Patient awake    Reviewed: Allergy & Precautions, NPO status , Patient's Chart, lab work & pertinent test results  Airway Mallampati: II  TM Distance: >3 FB Neck ROM: Full    Dental  (+) Edentulous Upper, Edentulous Lower   Pulmonary former smoker,    Pulmonary exam normal breath sounds clear to auscultation       Cardiovascular hypertension, +CHF  Normal cardiovascular exam+ Valvular Problems/Murmurs AI  Rhythm:Regular Rate:Normal  ECHO 10/22/19:    1. Left ventricular ejection fraction, by estimation, is 60 to 65%. The left ventricle has normal function. The left ventricle has no regional wall motion abnormalities. There is mild left ventricular hypertrophy. Left ventricular diastolic parameters  are consistent with Grade I diastolic dysfunction (impaired relaxation). 2. Right ventricular systolic function is normal. The right ventricular size is normal. 3. The mitral valve is grossly normal. No evidence of mitral valve regurgitation. 4. The aortic valve is tricuspid. Aortic valve regurgitation is mild to moderate. 5. Aortic dilatation noted. There is mild dilatation of the ascending aorta and of the aortic root measuring 43 mm. 6. The inferior vena cava is normal in size with greater than 50% respiratory variability, suggesting right atrial pressure of 3 mmHg.  EKG Sinus bradycardia rate 57   Neuro/Psych negative neurological ROS  negative psych ROS   GI/Hepatic   Endo/Other  Obesity   Renal/GU Renal InsufficiencyRenal disease  negative genitourinary   Musculoskeletal  (+) Arthritis ,   Abdominal   Peds  Hematology negative hematology ROS (+)   Anesthesia Other Findings   Reproductive/Obstetrics                           Anesthesia Physical Anesthesia Plan  ASA: III  Anesthesia Plan: Bier Block and  Bier Block-LIDOCAINE ONLY   Post-op Pain Management:    Induction:   PONV Risk Score and Plan: 1 and Propofol infusion, TIVA and Treatment may vary due to age or medical condition  Airway Management Planned: Natural Airway and Simple Face Mask  Additional Equipment:   Intra-op Plan:   Post-operative Plan:   Informed Consent: I have reviewed the patients History and Physical, chart, labs and discussed the procedure including the risks, benefits and alternatives for the proposed anesthesia with the patient or authorized representative who has indicated his/her understanding and acceptance.       Plan Discussed with: CRNA and Anesthesiologist  Anesthesia Plan Comments:         Anesthesia Quick Evaluation

## 2020-08-04 NOTE — Anesthesia Postprocedure Evaluation (Signed)
Anesthesia Post Note  Patient: John Hogan.  Procedure(s) Performed: EXCISION MASS LEFT PALM (Left Hand)     Patient location during evaluation: PACU Anesthesia Type: General Level of consciousness: awake Pain management: pain level controlled Vital Signs Assessment: post-procedure vital signs reviewed and stable Respiratory status: spontaneous breathing and respiratory function stable Cardiovascular status: stable Postop Assessment: no apparent nausea or vomiting Anesthetic complications: no   No complications documented.  Last Vitals:  Vitals:   08/04/20 1230 08/04/20 1243  BP: 109/77 (!) 115/58  Pulse: (!) 57 74  Resp: 15 18  Temp:  36.5 C  SpO2: 98% 98%    Last Pain:  Vitals:   08/04/20 1255  TempSrc:   PainSc: 4                  Candra R Maudene Stotler

## 2020-08-04 NOTE — Transfer of Care (Signed)
Immediate Anesthesia Transfer of Care Note  Patient: John Hogan.  Procedure(s) Performed: EXCISION MASS LEFT PALM (Left Hand)  Patient Location: PACU  Anesthesia Type:General  Level of Consciousness: drowsy  Airway & Oxygen Therapy: Patient Spontanous Breathing and Patient connected to face mask oxygen  Post-op Assessment: Report given to RN and Post -op Vital signs reviewed and stable  Post vital signs: Reviewed and stable  Last Vitals:  Vitals Value Taken Time  BP    Temp    Pulse    Resp 13 08/04/20 1215  SpO2    Vitals shown include unvalidated device data.  Last Pain:  Vitals:   08/04/20 1007  TempSrc: Oral  PainSc: 0-No pain         Complications: No complications documented.

## 2020-08-04 NOTE — Anesthesia Procedure Notes (Signed)
Procedure Name: LMA Insertion Date/Time: 08/04/2020 11:42 AM Performed by: Lavonia Dana, CRNA Pre-anesthesia Checklist: Patient identified, Emergency Drugs available, Suction available and Patient being monitored Patient Re-evaluated:Patient Re-evaluated prior to induction Oxygen Delivery Method: Circle system utilized Preoxygenation: Pre-oxygenation with 100% oxygen Induction Type: IV induction Ventilation: Mask ventilation without difficulty LMA: LMA inserted LMA Size: 5.0 Number of attempts: 1 Airway Equipment and Method: Bite block Placement Confirmation: positive ETCO2 Tube secured with: Tape Dental Injury: Teeth and Oropharynx as per pre-operative assessment

## 2020-08-04 NOTE — Brief Op Note (Signed)
08/04/2020  12:13 PM  PATIENT:  Gatha Mayer.  70 y.o. male  PRE-OPERATIVE DIAGNOSIS:  LEFT PALM MASS  POST-OPERATIVE DIAGNOSIS:  LEFT PALM MASS  PROCEDURE:  Procedure(s): EXCISION MASS LEFT PALM (Left)  SURGEON:  Surgeon(s) and Role:    Daryll Brod, MD - Primary  PHYSICIAN ASSISTANT:   ASSISTANTS: none   ANESTHESIA:   local and general  EBL:  2 mL   BLOOD ADMINISTERED:none  DRAINS: none   LOCAL MEDICATIONS USED:  BUPIVICAINE   SPECIMEN:  Excision  DISPOSITION OF SPECIMEN:  PATHOLOGY  COUNTS:  YES  TOURNIQUET:   Total Tourniquet Time Documented: Forearm (Left) - 12 minutes Total: Forearm (Left) - 12 minutes   DICTATION: .Dragon Dictation  PLAN OF CARE: Discharge to home after PACU  PATIENT DISPOSITION:  PACU - hemodynamically stable.

## 2020-08-04 NOTE — Discharge Instructions (Addendum)

## 2020-08-05 ENCOUNTER — Encounter (HOSPITAL_BASED_OUTPATIENT_CLINIC_OR_DEPARTMENT_OTHER): Payer: Self-pay | Admitting: Orthopedic Surgery

## 2020-08-05 LAB — SURGICAL PATHOLOGY

## 2020-08-30 ENCOUNTER — Other Ambulatory Visit: Payer: Self-pay | Admitting: Internal Medicine

## 2020-08-30 DIAGNOSIS — E785 Hyperlipidemia, unspecified: Secondary | ICD-10-CM

## 2020-09-07 ENCOUNTER — Other Ambulatory Visit: Payer: Self-pay

## 2020-09-07 ENCOUNTER — Telehealth: Payer: Self-pay | Admitting: Pharmacist

## 2020-09-07 ENCOUNTER — Encounter: Payer: Self-pay | Admitting: Internal Medicine

## 2020-09-07 ENCOUNTER — Ambulatory Visit (INDEPENDENT_AMBULATORY_CARE_PROVIDER_SITE_OTHER): Payer: Managed Care, Other (non HMO) | Admitting: Internal Medicine

## 2020-09-07 VITALS — BP 138/96 | HR 64 | Temp 98.3°F | Resp 16 | Ht 71.0 in | Wt 228.0 lb

## 2020-09-07 DIAGNOSIS — I5042 Chronic combined systolic (congestive) and diastolic (congestive) heart failure: Secondary | ICD-10-CM | POA: Diagnosis not present

## 2020-09-07 DIAGNOSIS — I10 Essential (primary) hypertension: Secondary | ICD-10-CM

## 2020-09-07 MED ORDER — ENTRESTO 24-26 MG PO TABS: 1.0000 | ORAL_TABLET | Freq: Two times a day (BID) | ORAL | 0 refills | Status: DC

## 2020-09-07 NOTE — Progress Notes (Signed)
Subjective:  Patient ID: John Hogan., male    DOB: 1951/01/27  Age: 70 y.o. MRN: [id number]  CC: Hypertension and Congestive Heart Failure  This visit occurred during the SARS-CoV-2 public health emergency.  Safety protocols were in place, including screening questions prior to the visit, additional usage of staff PPE, and extensive cleaning of exam room while observing appropriate contact time as indicated for disinfecting solutions.    HPI Neithan Day. presents for f/up -  He tells me he has not taken Entresto in at least a month.  He tells me his co-pay is $1800.  He has had some weight gain but he denies chest pain, shortness of breath, edema, dizziness, or lightheadedness.  Outpatient Medications Prior to Visit  Medication Sig Dispense Refill  . allopurinol (ZYLOPRIM) 300 MG tablet Take 1 tablet (300 mg total) by mouth daily. 90 tablet 1  . Cholecalciferol (CVS D3) 50 MCG (2000 UT) CAPS Take 1 capsule (2,000 Units total) by mouth daily. 90 capsule 1  . colchicine 0.6 MG tablet Take 1 tablet (0.6 mg total) by mouth daily. When having flare 5 tablet 2  . cyclobenzaprine (FLEXERIL) 10 MG tablet TAKE 1 TABLET BY MOUTH THREE TIMES A DAY AS NEEDED FOR MUSCLE SPASMS 90 tablet 1  . pravastatin (PRAVACHOL) 20 MG tablet TAKE 1 TABLET BY MOUTH EVERY DAY 90 tablet 1  . sildenafil (REVATIO) 20 MG tablet TAKE 4 TABLETS BY MOUTH DAILY AS NEEDED. 360 tablet 0  . SIMBRINZA 1-0.2 % SUSP INSTILL 1 DROP INTO BOTH EYES TWICE A DAY  3  . timolol (TIMOPTIC) 0.5 % ophthalmic solution PUT 1 DROP IN BOTH EYES TWICE A DAY  3  . Timolol Maleate 0.5 % (DAILY) SOLN Apply 1 drop to eye 2 (two) times daily.    Marland Kitchen torsemide (DEMADEX) 20 MG tablet Take 1 tablet (20 mg total) by mouth daily. 90 tablet 0  . traMADol (ULTRAM) 50 MG tablet Take 1 tablet (50 mg total) by mouth every 6 (six) hours as needed. 20 tablet 0  . Travoprost, BAK Free, (TRAVATAN) 0.004 % SOLN ophthalmic solution Place 1 drop into both eyes  at bedtime.     . triamcinolone (KENALOG) 0.1 % APPLY TO AFFECTED AREA TWICE A DAY 30 g 1  . zolpidem (AMBIEN) 5 MG tablet TAKE 1 TABLET BY MOUTH EVERY DAY AT BEDTIME AS NEEDED 30 tablet 5  . sacubitril-valsartan (ENTRESTO) 24-26 MG Take 1 tablet by mouth 2 (two) times daily. 180 tablet 0   No facility-administered medications prior to visit.    ROS Review of Systems  Constitutional: Positive for unexpected weight change. Negative for appetite change, diaphoresis and fatigue.  HENT: Negative.   Eyes: Negative.   Respiratory: Negative for cough, chest tightness, shortness of breath and wheezing.   Cardiovascular: Negative for chest pain, palpitations and leg swelling.  Gastrointestinal: Negative for abdominal pain, diarrhea and nausea.  Endocrine: Negative.   Genitourinary: Negative.  Negative for difficulty urinating and dysuria.  Skin: Negative.  Negative for color change and pallor.  Neurological: Negative.  Negative for dizziness, weakness, light-headedness and headaches.  Hematological: Negative for adenopathy. Does not bruise/bleed easily.  Psychiatric/Behavioral: Negative.     Objective:  BP (!) 138/96   Pulse 64   Temp 98.3 F (36.8 C) (Oral)   Resp 16   Ht 5\' 11"  (1.803 m)   Wt 228 lb (103.4 kg)   SpO2 98%   BMI 31.80 kg/m   BP Readings from  Last 3 Encounters:  09/07/20 (!) 138/96  08/04/20 (!) 115/58  06/23/20 122/86    Wt Readings from Last 3 Encounters:  09/07/20 228 lb (103.4 kg)  08/04/20 223 lb 1.7 oz (101.2 kg)  06/23/20 230 lb 6.4 oz (104.5 kg)    Physical Exam Vitals reviewed.  HENT:     Nose: Nose normal.     Mouth/Throat:     Mouth: Mucous membranes are moist.  Eyes:     General: No scleral icterus.    Conjunctiva/sclera: Conjunctivae normal.  Cardiovascular:     Rate and Rhythm: Normal rate and regular rhythm.     Heart sounds: No murmur heard.   Pulmonary:     Effort: Pulmonary effort is normal.     Breath sounds: No stridor. No  wheezing, rhonchi or rales.  Abdominal:     Palpations: There is no mass.     Tenderness: There is no abdominal tenderness. There is no guarding.  Musculoskeletal:        General: Normal range of motion.     Cervical back: Neck supple.     Right lower leg: No edema.     Left lower leg: No edema.  Lymphadenopathy:     Cervical: No cervical adenopathy.  Skin:    General: Skin is warm and dry.  Neurological:     General: No focal deficit present.     Mental Status: He is alert.  Psychiatric:        Mood and Affect: Mood normal.        Behavior: Behavior normal.     Lab Results  Component Value Date   WBC 3.4 (L) 05/04/2020   HGB 13.7 05/04/2020   HCT 41.1 05/04/2020   PLT 186.0 05/04/2020   GLUCOSE 98 07/29/2020   CHOL 159 10/02/2019   TRIG 55.0 10/02/2019   HDL 64.80 10/02/2019   LDLDIRECT 140.7 02/27/2013   LDLCALC 83 10/02/2019   ALT 11 10/02/2019   AST 13 10/02/2019   NA 140 07/29/2020   K 3.7 07/29/2020   CL 102 07/29/2020   CREATININE 2.05 (H) 07/29/2020   BUN 25 (H) 07/29/2020   CO2 27 07/29/2020   TSH 1.94 10/02/2019   PSA 1.89 10/02/2019    No results found.  Assessment & Plan:   Zeev was seen today for hypertension and congestive heart failure.  Diagnoses and all orders for this visit:  COMBINED HEART FAILURE, CHRONIC- Will restart entresto. Will work on PAP. He sees his CHF doctor in the next month.  -     sacubitril-valsartan (ENTRESTO) 24-26 MG; Take 1 tablet by mouth 2 (two) times daily. -     Ambulatory referral to Cardiology  Essential hypertension, benign- His BP is not at goal (130/80). Will restart entresto. -     sacubitril-valsartan (ENTRESTO) 24-26 MG; Take 1 tablet by mouth 2 (two) times daily.   I am having Gatha Mayer. maintain his Travoprost (BAK Free), timolol, Simbrinza, Timolol Maleate (Once-Daily), colchicine, sildenafil, zolpidem, allopurinol, torsemide, CVS D3, cyclobenzaprine, triamcinolone, traMADol, pravastatin, and  Entresto.  Meds ordered this encounter  Medications  . sacubitril-valsartan (ENTRESTO) 24-26 MG    Sig: Take 1 tablet by mouth 2 (two) times daily.    Dispense:  60 tablet    Refill:  0     Follow-up: Return in about 3 months (around 12/08/2020).  Scarlette Calico, MD

## 2020-09-07 NOTE — Patient Instructions (Signed)
Heart Failure, Diagnosis  Heart failure is a condition in which the heart has trouble pumping blood. This may mean that the heart cannot pump enough blood out to the body or that the heart does not fill up with enough blood. For some people with heart failure, fluid may back up into the lungs. There may also be swelling (edema) in the lower legs. Heart failure is usually a long-term (chronic) condition. It is important for you to take good care of yourself and follow the treatment plan from your health care provider. What are the causes? This condition may be caused by:  High blood pressure (hypertension). Hypertension causes the heart muscle to work harder than normal.  Coronary artery disease, or CAD. CAD is the buildup of cholesterol and fat (plaque) in the arteries of the heart.  Heart attack, also called myocardial infarction. This injures the heart muscle, making it hard for the heart to pump blood.  Abnormal heart valves. The valves do not open and close properly, forcing the heart to pump harder to keep the blood flowing.  Heart muscle disease, inflammation, or infection (cardiomyopathy or myocarditis). This is damage to the heart muscle. It can increase the risk of heart failure.  Lung disease. The heart works harder when the lungs are not healthy. What increases the risk? The risk of heart failure increases as a person ages. This condition is also more likely to develop in people who:  Are obese.  Are male.  Use tobacco or nicotine products.  Abuse alcohol or drugs.  Have taken medicines that can damage the heart, such as chemotherapy drugs.  Have any of these conditions: ? Diabetes. ? Abnormal heart rhythms. ? Thyroid problems. ? Low blood counts (anemia). ? Chronic kidney disease.  Have a family history of heart failure. What are the signs or symptoms? Symptoms of this condition include:  Shortness of breath with activity, such as when climbing stairs.  A cough  that does not go away.  Swelling of the feet, ankles, legs, or abdomen.  Losing or gaining weight for no reason.  Trouble breathing when lying flat.  Waking from sleep because of the need to sit up and get more air.  Rapid heartbeat.  Tiredness (fatigue) and loss of energy.  Feeling light-headed, dizzy, or close to fainting.  Nausea or loss of appetite.  Waking up more often during the night to urinate (nocturia).  Confusion. How is this diagnosed? This condition is diagnosed based on:  Your medical history, symptoms, and a physical exam.  Diagnostic tests, which may include: ? Echocardiogram. ? Electrocardiogram (ECG). ? Chest X-ray. ? Blood tests. ? Exercise stress test. ? Cardiac MRI. ? Cardiac catheterization and angiogram. ? Radionuclide scans. How is this treated? Treatment for this condition is aimed at managing the symptoms of heart failure. Medicines Treatment may include medicines that:  Help lower blood pressure by relaxing (dilating) the blood vessels. These medicines are called ACE inhibitors (angiotensin-converting enzyme), ARBs (angiotensin receptor blockers), or vasodilators.  Cause the kidneys to remove salt and water from the blood through urination (diuretics).  Improve heart muscle strength and prevent the heart from beating too fast (beta blockers).  Increase the force of the heartbeat (digoxin).  Lower heart rates. Certain diabetes medicines (SGLT-2 inhibitors) may also be used in treatment. Healthy behavior changes Treatment may also include making healthy lifestyle changes, such as:  Reaching and staying at a healthy weight.  Not using tobacco or nicotine products.  Eating heart-healthy foods.    Limiting or avoiding alcohol.  Stopping the use of illegal drugs.  Being physically active.  Participating in a cardiac rehabilitation program, which is a treatment program to improve your health and well-being through exercise training,  education, and counseling. Other treatments Other treatments may include:  Procedures to open blocked arteries or repair damaged valves.  Placing a pacemaker to improve heart function (cardiac resynchronization therapy).  Placing a device to treat serious abnormal heart rhythms (implantable cardioverter defibrillator, or ICD).  Placing a device to improve the pumping ability of the heart (left ventricular assist device, or LVAD).  Receiving a healthy heart from a donor (heart transplant). This is done when other treatments have not helped. Follow these instructions at home:  Manage other health conditions as told by your health care provider. These may include hypertension, diabetes, thyroid disease, or abnormal heart rhythms.  Get ongoing education and support as needed. Learn as much as you can about heart failure.  Keep all follow-up visits. This is important. Summary  Heart failure is a condition in which the heart has trouble pumping blood.  This condition is commonly caused by high blood pressure and other diseases of the heart and lungs.  Symptoms of this condition include shortness of breath, tiredness (fatigue), nausea, and swelling of the feet, ankles, legs, or abdomen.  Treatments for this condition may include medicines, lifestyle changes, and surgery.  Manage other health conditions as told by your health care provider. This information is not intended to replace advice given to you by your health care provider. Make sure you discuss any questions you have with your health care provider. Document Revised: 01/04/2020 Document Reviewed: 01/04/2020 Elsevier Patient Education  2021 Elsevier Inc.  

## 2020-09-07 NOTE — Telephone Encounter (Signed)
Patient has [employer] and reports copay for Delene Loll is cost prohibitive at this time.  Reviewed application process for [employer] patient assistance program. Patient meets income/out of pocket spend criteria for the program. Patient will provide proof of income, out of pocket spend report, and will sign application. Will collaborate with prescriber Dr Ronnald Ramp for the provider portion of application. Once completed, application will be submitted via Fax   Patient assistance program Fax number: [phone]  Also provided [facility] with [facility] Free Trial card. Patient can pick up 30-day supply while we await PAP determination.  Patient is taking Entresto 24-26 mg BID, which is appropriate for his renal function and BP. There are no specific dose adjustments for renal dysfunction in product labeling (other than starting at 24-26 mg and titrating up every 2-4 weeks to target dose 97-103 mg), however I would not recommend increasing the dose at this time as dose titration carries the risk of further GFR decline, which could push his GFR to < 30.  Lab Results  Component Value Date   CREATININE 2.05 (H) 07/29/2020   BUN 25 (H) 07/29/2020   GFR 33.38 (L) 05/04/2020   GFRNONAA 34 (L) 07/29/2020   GFRAA 38 (L) 12/19/2019   NA 140 07/29/2020   K 3.7 07/29/2020   CALCIUM 9.3 07/29/2020   CO2 27 07/29/2020   BP Readings from Last 3 Encounters:  09/07/20 (!) 138/96  08/04/20 (!) 115/58  06/23/20 122/86    Charlton Haws, RPH

## 2020-09-25 ENCOUNTER — Other Ambulatory Visit: Payer: Self-pay | Admitting: Internal Medicine

## 2020-09-25 DIAGNOSIS — R21 Rash and other nonspecific skin eruption: Secondary | ICD-10-CM

## 2020-09-30 ENCOUNTER — Other Ambulatory Visit: Payer: Self-pay | Admitting: Internal Medicine

## 2020-09-30 DIAGNOSIS — I5042 Chronic combined systolic (congestive) and diastolic (congestive) heart failure: Secondary | ICD-10-CM

## 2020-09-30 DIAGNOSIS — I10 Essential (primary) hypertension: Secondary | ICD-10-CM

## 2020-09-30 DIAGNOSIS — M1000 Idiopathic gout, unspecified site: Secondary | ICD-10-CM

## 2020-09-30 NOTE — Telephone Encounter (Signed)
[employer] Patient Assistance called and was wondering if the application could be re sent. They only got 2 pages and she said that it looked like there were supposed to be 7. It can be re faxed to [phone].

## 2020-09-30 NOTE — Telephone Encounter (Signed)
Re-faxed application to fax number below.

## 2020-10-02 ENCOUNTER — Other Ambulatory Visit: Payer: Self-pay | Admitting: Internal Medicine

## 2020-10-02 DIAGNOSIS — N522 Drug-induced erectile dysfunction: Secondary | ICD-10-CM

## 2020-10-19 NOTE — Progress Notes (Signed)
Cardiology Office Note   Date:  10/20/2020   ID:  John Hogan., DOB 09-15-1950, MRN [id number]  PCP:  Janith Lima, MD  Cardiologist:   No primary care provider on file.   Chief Complaint  Patient presents with  . Aortic Insufficiency      History of Present Illness: John Hogan. is a 70 y.o. male who presents for followup of aortic insufficiency. He has had previously reduced ejection fraction but it has been improved at followup.  Last year his EF was NL and he had mild to moderate AI.  Since I last saw him he has been doing activities at church.  He walks quite a bit on Saturdays and Sundays. The patient denies any new symptoms such as chest discomfort, neck or arm discomfort. There has been no new shortness of breath, PND or orthopnea. There have been no reported palpitations, presyncope or syncope.    Past Medical History:  Diagnosis Date  . AI (aortic insufficiency)   . CHF (congestive heart failure) (John Hogan)   . Chronic kidney disease   . Glaucoma   . Gout   . H/O exercise stress test    Myoview 01/03/12: No scar or ischemia, EF 53%  . Hyperlipidemia   . Hypertension   . Hypertensive cardiomyopathy (John Hogan)    EF previously 35%;  echo 12/2012 EF 55%, mild AI, ascending aorta mildly dilated, mild LAE    Past Surgical History:  Procedure Laterality Date  . COLONOSCOPY    . CYST REMOVAL HAND    . HERNIA REPAIR     Umbilical  . MASS EXCISION Left 08/04/2020   Procedure: EXCISION MASS LEFT PALM;  Surgeon: Daryll Brod, MD;  Location: [place];  Service: Orthopedics;  Laterality: Left;  . POLYPECTOMY    . WRIST SURGERY     right     Current Outpatient Medications  Medication Sig Dispense Refill  . allopurinol (ZYLOPRIM) 300 MG tablet TAKE 1 TABLET BY MOUTH EVERY DAY 90 tablet 1  . Cholecalciferol ([facility] D3) 50 MCG (2000 UT) CAPS Take 1 capsule (2,000 Units total) by mouth daily. 90 capsule 1  . colchicine 0.6 MG tablet Take 1 tablet (0.6 mg  total) by mouth daily. When having flare 5 tablet 2  . cyclobenzaprine (FLEXERIL) 10 MG tablet TAKE 1 TABLET BY MOUTH THREE TIMES A DAY AS NEEDED FOR MUSCLE SPASMS 90 tablet 1  . pravastatin (PRAVACHOL) 20 MG tablet TAKE 1 TABLET BY MOUTH EVERY DAY 90 tablet 1  . sacubitril-valsartan (ENTRESTO) 24-26 MG Take 1 tablet by mouth 2 (two) times daily. 60 tablet 0  . sildenafil (REVATIO) 20 MG tablet TAKE 4 TABLETS BY MOUTH DAILY AS NEEDED. 360 tablet 0  . SIMBRINZA 1-0.2 % SUSP INSTILL 1 DROP INTO BOTH EYES TWICE A DAY  3  . timolol (TIMOPTIC) 0.5 % ophthalmic solution PUT 1 DROP IN BOTH EYES TWICE A DAY  3  . Timolol Maleate 0.5 % (DAILY) SOLN Apply 1 drop to eye 2 (two) times daily.    Marland Kitchen torsemide (DEMADEX) 20 MG tablet TAKE 1 TABLET BY MOUTH EVERY DAY 90 tablet 0  . traMADol (ULTRAM) 50 MG tablet Take 1 tablet (50 mg total) by mouth every 6 (six) hours as needed. 20 tablet 0  . Travoprost, BAK Free, (TRAVATAN) 0.004 % SOLN ophthalmic solution Place 1 drop into both eyes at bedtime.     . triamcinolone (KENALOG) 0.1 % APPLY TO AFFECTED AREA TWICE A  DAY 30 g 1  . zolpidem (AMBIEN) 5 MG tablet TAKE 1 TABLET BY MOUTH EVERY DAY AT BEDTIME AS NEEDED 30 tablet 5   No current facility-administered medications for this visit.    Allergies:   Patient has no known allergies.   ROS:  Please see the history of present illness.   Otherwise, review of systems are positive for none.   All other systems are reviewed and negative.    PHYSICAL EXAM: VS:  BP 125/77   Ht 5\' 11"  (1.803 m)   Wt 227 lb 3.2 oz (103.1 kg)   SpO2 98%   BMI 31.69 kg/m  , BMI Body mass index is 31.69 kg/m. GENERAL:  Well appearing NECK:  No jugular venous distention, waveform within normal limits, carotid upstroke brisk and symmetric, no bruits, no thyromegaly LUNGS:  Clear to auscultation bilaterally CHEST:  Unremarkable HEART:  PMI not displaced or sustained,S1 and S2 within normal limits, no S3, no S4, no clicks, no rubs, 2  out of 6 diastolic murmur heard at the third left intercostal space persisting into at least mid diastole, no systolic murmurs ABD:  Flat, positive bowel sounds normal in frequency in pitch, no bruits, no rebound, no guarding, no midline pulsatile mass, no hepatomegaly, no splenomegaly EXT:  2 plus pulses throughout, no edema, no cyanosis no clubbing   EKG:  EKG is  ordered today. Sinus rhythm, rate 57, left axis deviation, early transition in lead V2.   Recent Labs: 05/04/2020: Hemoglobin 13.7; Platelets 186.0 07/29/2020: BUN 25; Creatinine, Ser 2.05; Potassium 3.7; Sodium 140    Lipid Panel    Component Value Date/Time   CHOL 159 10/02/2019 1052   TRIG 55.0 10/02/2019 1052   HDL 64.80 10/02/2019 1052   CHOLHDL 2 10/02/2019 1052   VLDL 11.0 10/02/2019 1052   LDLCALC 83 10/02/2019 1052   LDLDIRECT 140.7 02/27/2013 1405      Wt Readings from Last 3 Encounters:  10/20/20 227 lb 3.2 oz (103.1 kg)  09/07/20 228 lb (103.4 kg)  08/04/20 223 lb 1.7 oz (101.2 kg)      Other studies Reviewed: Additional studies/ records that were reviewed today include: Labs. Review of the above records demonstrates:  Please see elsewhere in the note.     ASSESSMENT AND PLAN:   Aortic insufficiency - This was mild to moderate last year.   I will plan on an echo this year but would probably repeat this next year to follow closely.  It looks like he has been stable since 2013 at least.   HTN - The blood pressure is controlled.  For now he is able to afford his Delene Loll but he does not think he will long-term and I likely would switch him to the valsartan only if Delene Loll becomes cost prohibitive.  He will let me know.   Dyslipidemia -  LDL was 83.  No change in therapy.   CKD -   I note that his creatinine was 2.05 in Feb.   this is been stable over the last year or so.   I will defer follow-up to Dr. Ronnald Ramp and talk to the patient about this.   Current medicines are reviewed at length with  the patient today.  The patient does not have concerns regarding medicines.  The following changes have been made: None  Labs/ tests ordered today include: None  Orders Placed This Encounter  Procedures  . EKG 12-Lead     Disposition:   FU with me in  1 year.     Signed, Minus Breeding, MD  10/20/2020 4:11 PM    [facility]

## 2020-10-20 ENCOUNTER — Other Ambulatory Visit: Payer: Self-pay

## 2020-10-20 ENCOUNTER — Ambulatory Visit (INDEPENDENT_AMBULATORY_CARE_PROVIDER_SITE_OTHER): Payer: Managed Care, Other (non HMO) | Admitting: Cardiology

## 2020-10-20 ENCOUNTER — Encounter: Payer: Self-pay | Admitting: Cardiology

## 2020-10-20 VITALS — BP 125/77 | Ht 71.0 in | Wt 227.2 lb

## 2020-10-20 DIAGNOSIS — E785 Hyperlipidemia, unspecified: Secondary | ICD-10-CM

## 2020-10-20 DIAGNOSIS — I10 Essential (primary) hypertension: Secondary | ICD-10-CM

## 2020-10-20 DIAGNOSIS — N184 Chronic kidney disease, stage 4 (severe): Secondary | ICD-10-CM

## 2020-10-20 DIAGNOSIS — I351 Nonrheumatic aortic (valve) insufficiency: Secondary | ICD-10-CM | POA: Diagnosis not present

## 2020-10-20 NOTE — Patient Instructions (Signed)
Medication Instructions:  Your physician recommends that you continue on your current medications as directed. Please refer to the Current Medication list given to you today.  *If you need a refill on your cardiac medications before your next appointment, please call your pharmacy*  Lab Work: NONE ordered at this time of appointment   If you have labs (blood work) drawn today and your tests are completely normal, you will receive your results only by: Marland Kitchen MyChart Message (if you have MyChart) OR . A paper copy in the mail If you have any lab test that is abnormal or we need to change your treatment, we will call you to review the results.  Testing/Procedures: NONE ordered at this time of appointment   Follow-Up: At [facility], you and your health needs are our priority.  As part of our continuing mission to provide you with exceptional heart care, we have created designated Provider Care Teams.  These Care Teams include your primary Cardiologist (physician) and Advanced Practice Providers (APPs -  Physician Assistants and Nurse Practitioners) who all work together to provide you with the care you need, when you need it.  We recommend signing up for the patient portal called "MyChart".  Sign up information is provided on this After Visit Summary.  MyChart is used to connect with patients for Virtual Visits (Telemedicine).  Patients are able to view lab/test results, encounter notes, upcoming appointments, etc.  Non-urgent messages can be sent to your provider as well.   To learn more about what you can do with MyChart, go to [email or web address].    Your next appointment:   12 month(s)  The format for your next appointment:   In Person  Provider:   Minus Breeding, MD  Other Instructions

## 2020-11-09 ENCOUNTER — Other Ambulatory Visit: Payer: Self-pay | Admitting: Internal Medicine

## 2020-11-09 DIAGNOSIS — I5042 Chronic combined systolic (congestive) and diastolic (congestive) heart failure: Secondary | ICD-10-CM

## 2020-11-09 DIAGNOSIS — I10 Essential (primary) hypertension: Secondary | ICD-10-CM

## 2020-11-09 DIAGNOSIS — F5104 Psychophysiologic insomnia: Secondary | ICD-10-CM

## 2020-11-27 ENCOUNTER — Other Ambulatory Visit: Payer: Self-pay | Admitting: Internal Medicine

## 2020-11-27 DIAGNOSIS — E559 Vitamin D deficiency, unspecified: Secondary | ICD-10-CM

## 2020-12-01 ENCOUNTER — Other Ambulatory Visit: Payer: Self-pay | Admitting: Internal Medicine

## 2020-12-01 DIAGNOSIS — R21 Rash and other nonspecific skin eruption: Secondary | ICD-10-CM

## 2021-01-02 ENCOUNTER — Other Ambulatory Visit: Payer: Self-pay | Admitting: Internal Medicine

## 2021-01-02 DIAGNOSIS — E559 Vitamin D deficiency, unspecified: Secondary | ICD-10-CM

## 2021-01-02 DIAGNOSIS — R21 Rash and other nonspecific skin eruption: Secondary | ICD-10-CM

## 2021-01-02 DIAGNOSIS — E785 Hyperlipidemia, unspecified: Secondary | ICD-10-CM

## 2021-01-18 ENCOUNTER — Telehealth: Payer: Self-pay

## 2021-01-18 MED ORDER — VALSARTAN 80 MG PO TABS: 80.0000 mg | ORAL_TABLET | Freq: Every day | ORAL | 6 refills | Status: DC

## 2021-01-18 NOTE — Telephone Encounter (Signed)
Patient called this morning and left a message on the refill line stating that he needs a generic prescription for a new heart medication that was started because his insurance did not cover the original prescription. Patient did not say what the medication was. Patient of Dr. Percival Spanish. Please address thank you. Pt phone number is [phone].

## 2021-01-18 NOTE — Telephone Encounter (Signed)
Spoke with pt he states that his Entresto 24-26 he paid $200 and he cannot afford this. He did fill out pt assist and was denied. Looks like he did discuss this with [facility] and this is what is in his note:  HTN - The blood pressure is controlled.  For now he is able to afford his Delene Loll but he does not think he will long-term and I likely would switch him to the valsartan only if Delene Loll becomes cost prohibitive.  He will let me know.  Ok to switch to valsartan? What is the dose? Please advise.  Per Tommy Medal, RPH: I would start him on 80 mg qd, check home BP and be aware that we may have toincrease to 160.   New rx sent to pharmacy as requested. Pt notified he will start today and take BP BID and when he is feeling bad. He will CB if anything is needed.

## 2021-01-18 NOTE — Telephone Encounter (Signed)
Tried to call pt..the patient has no VM set up. Will try again later

## 2021-01-18 NOTE — Telephone Encounter (Signed)
Patient returning call.

## 2021-02-12 ENCOUNTER — Telehealth: Payer: Self-pay

## 2021-02-12 NOTE — Telephone Encounter (Signed)
pt is requesting a rx refill for zolpidem (AMBIEN) 5 MG tablet and colchicine 0.6 MG tablet.

## 2021-02-15 ENCOUNTER — Other Ambulatory Visit: Payer: Self-pay | Admitting: Internal Medicine

## 2021-02-15 DIAGNOSIS — F5104 Psychophysiologic insomnia: Secondary | ICD-10-CM

## 2021-02-15 DIAGNOSIS — M1000 Idiopathic gout, unspecified site: Secondary | ICD-10-CM

## 2021-02-15 MED ORDER — COLCHICINE 0.6 MG PO TABS: 0.6000 mg | ORAL_TABLET | Freq: Every day | ORAL | 2 refills | Status: DC

## 2021-02-15 MED ORDER — ZOLPIDEM TARTRATE 5 MG PO TABS
5.0000 mg | ORAL_TABLET | Freq: Every evening | ORAL | 0 refills | Status: DC | PRN
Start: 2021-02-15 — End: 2021-03-19

## 2021-02-22 ENCOUNTER — Other Ambulatory Visit: Payer: Self-pay

## 2021-02-22 ENCOUNTER — Ambulatory Visit (INDEPENDENT_AMBULATORY_CARE_PROVIDER_SITE_OTHER): Payer: 59

## 2021-02-22 ENCOUNTER — Encounter: Payer: Self-pay | Admitting: Internal Medicine

## 2021-02-22 ENCOUNTER — Other Ambulatory Visit: Payer: Self-pay | Admitting: Internal Medicine

## 2021-02-22 ENCOUNTER — Ambulatory Visit (INDEPENDENT_AMBULATORY_CARE_PROVIDER_SITE_OTHER): Payer: 59 | Admitting: Internal Medicine

## 2021-02-22 VITALS — BP 138/82 | HR 71 | Temp 97.9°F | Resp 16 | Ht 71.0 in | Wt 228.6 lb

## 2021-02-22 DIAGNOSIS — G8929 Other chronic pain: Secondary | ICD-10-CM | POA: Insufficient documentation

## 2021-02-22 DIAGNOSIS — M25551 Pain in right hip: Secondary | ICD-10-CM | POA: Diagnosis not present

## 2021-02-22 DIAGNOSIS — N1831 Chronic kidney disease, stage 3a: Secondary | ICD-10-CM | POA: Diagnosis not present

## 2021-02-22 DIAGNOSIS — E559 Vitamin D deficiency, unspecified: Secondary | ICD-10-CM

## 2021-02-22 DIAGNOSIS — I10 Essential (primary) hypertension: Secondary | ICD-10-CM

## 2021-02-22 DIAGNOSIS — N400 Enlarged prostate without lower urinary tract symptoms: Secondary | ICD-10-CM | POA: Diagnosis not present

## 2021-02-22 DIAGNOSIS — M8949 Other hypertrophic osteoarthropathy, multiple sites: Secondary | ICD-10-CM

## 2021-02-22 DIAGNOSIS — E785 Hyperlipidemia, unspecified: Secondary | ICD-10-CM | POA: Diagnosis not present

## 2021-02-22 DIAGNOSIS — I5042 Chronic combined systolic (congestive) and diastolic (congestive) heart failure: Secondary | ICD-10-CM

## 2021-02-22 DIAGNOSIS — M1000 Idiopathic gout, unspecified site: Secondary | ICD-10-CM

## 2021-02-22 DIAGNOSIS — Z0000 Encounter for general adult medical examination without abnormal findings: Secondary | ICD-10-CM

## 2021-02-22 DIAGNOSIS — Z23 Encounter for immunization: Secondary | ICD-10-CM | POA: Diagnosis not present

## 2021-02-22 DIAGNOSIS — M159 Polyosteoarthritis, unspecified: Secondary | ICD-10-CM

## 2021-02-22 LAB — LIPID PANEL
Cholesterol: 161 mg/dL (ref 0–200)
HDL: 55.6 mg/dL (ref >39.00)
LDL Cholesterol: 87 mg/dL (ref 0–99)
NonHDL: 105.1
Total CHOL/HDL Ratio: 3
Triglycerides: 92 mg/dL (ref 0.0–149.0)
VLDL: 18.4 mg/dL (ref 0.0–40.0)

## 2021-02-22 LAB — HEPATIC FUNCTION PANEL
ALT: 10 U/L (ref 0–53)
AST: 13 U/L (ref 0–37)
Albumin: 4.1 g/dL (ref 3.5–5.2)
Alkaline Phosphatase: 88 U/L (ref 39–117)
Bilirubin, Direct: 0.2 mg/dL (ref 0.0–0.3)
Total Bilirubin: 1 mg/dL (ref 0.2–1.2)
Total Protein: 7.4 g/dL (ref 6.0–8.3)

## 2021-02-22 LAB — URIC ACID: Uric Acid, Serum: 5.8 mg/dL (ref 4.0–7.8)

## 2021-02-22 LAB — BASIC METABOLIC PANEL
BUN: 19 mg/dL (ref 6–23)
CO2: 29 mEq/L (ref 19–32)
Calcium: 9.1 mg/dL (ref 8.4–10.5)
Chloride: 103 mEq/L (ref 96–112)
Creatinine, Ser: 1.94 mg/dL — ABNORMAL HIGH (ref 0.40–1.50)
GFR: 34.22 mL/min — ABNORMAL LOW (ref >60.00)
Glucose, Bld: 89 mg/dL (ref 70–99)
Potassium: 3.7 mEq/L (ref 3.5–5.1)
Sodium: 141 mEq/L (ref 135–145)

## 2021-02-22 LAB — CBC WITH DIFFERENTIAL/PLATELET
Basophils Absolute: 0 10*3/uL (ref 0.0–0.1)
Basophils Relative: 0.9 % (ref 0.0–3.0)
Eosinophils Absolute: 0.2 10*3/uL (ref 0.0–0.7)
Eosinophils Relative: 6.1 % — ABNORMAL HIGH (ref 0.0–5.0)
HCT: 39.8 % (ref 39.0–52.0)
Hemoglobin: 13.4 g/dL (ref 13.0–17.0)
Lymphocytes Relative: 40.9 % (ref 12.0–46.0)
Lymphs Abs: 1.5 10*3/uL (ref 0.7–4.0)
MCHC: 33.7 g/dL (ref 30.0–36.0)
MCV: 93.5 fl (ref 78.0–100.0)
Monocytes Absolute: 0.3 10*3/uL (ref 0.1–1.0)
Monocytes Relative: 9.3 % (ref 3.0–12.0)
Neutro Abs: 1.5 10*3/uL (ref 1.4–7.7)
Neutrophils Relative %: 42.8 % — ABNORMAL LOW (ref 43.0–77.0)
Platelets: 180 10*3/uL (ref 150.0–400.0)
RBC: 4.25 Mil/uL (ref 4.22–5.81)
RDW: 14.3 % (ref 11.5–15.5)
WBC: 3.6 10*3/uL — ABNORMAL LOW (ref 4.0–10.5)

## 2021-02-22 LAB — URINALYSIS, ROUTINE W REFLEX MICROSCOPIC
Bilirubin Urine: NEGATIVE
Hgb urine dipstick: NEGATIVE
Ketones, ur: NEGATIVE
Leukocytes,Ua: NEGATIVE
Nitrite: NEGATIVE
RBC / HPF: NONE SEEN (ref 0–?)
Specific Gravity, Urine: 1.015 (ref 1.000–1.030)
Total Protein, Urine: NEGATIVE
Urine Glucose: NEGATIVE
Urobilinogen, UA: 0.2 (ref 0.0–1.0)
WBC, UA: NONE SEEN (ref 0–?)
pH: 6 (ref 5.0–8.0)

## 2021-02-22 LAB — PSA: PSA: 2.39 ng/mL (ref 0.10–4.00)

## 2021-02-22 LAB — VITAMIN D 25 HYDROXY (VIT D DEFICIENCY, FRACTURES): VITD: 62.74 ng/mL (ref 30.00–100.00)

## 2021-02-22 LAB — TSH: TSH: 2.25 u[IU]/mL (ref 0.35–5.50)

## 2021-02-22 MED ORDER — TRAMADOL HCL 50 MG PO TABS: 50.0000 mg | ORAL_TABLET | Freq: Four times a day (QID) | ORAL | 3 refills | Status: DC | PRN

## 2021-02-22 MED ORDER — SHINGRIX 50 MCG/0.5ML IM SUSR: 0.5000 mL | Freq: Once | INTRAMUSCULAR | 1 refills | Status: AC

## 2021-02-22 MED ORDER — TORSEMIDE 20 MG PO TABS
20.0000 mg | ORAL_TABLET | Freq: Every day | ORAL | 0 refills | Status: DC
Start: 2021-02-22 — End: 2021-04-07

## 2021-02-22 NOTE — Patient Instructions (Signed)
Health Maintenance, Male Adopting a healthy lifestyle and getting preventive care are important in promoting health and wellness. Ask your health care provider about: The right schedule for you to have regular tests and exams. Things you can do on your own to prevent diseases and keep yourself healthy. What should I know about diet, weight, and exercise? Eat a healthy diet  Eat a diet that includes plenty of vegetables, fruits, low-fat dairy products, and lean protein. Do not eat a lot of foods that are high in solid fats, added sugars, or sodium.  Maintain a healthy weight Body mass index (BMI) is a measurement that can be used to identify possible weight problems. It estimates body fat based on height and weight. Your health care provider can help determine your BMI and help you achieve or maintain ahealthy weight. Get regular exercise Get regular exercise. This is one of the most important things you can do for your health. Most adults should: Exercise for at least 150 minutes each week. The exercise should increase your heart rate and make you sweat (moderate-intensity exercise). Do strengthening exercises at least twice a week. This is in addition to the moderate-intensity exercise. Spend less time sitting. Even light physical activity can be beneficial. Watch cholesterol and blood lipids Have your blood tested for lipids and cholesterol at 71 years of age, then havethis test every 5 years. You may need to have your cholesterol levels checked more often if: Your lipid or cholesterol levels are high. You are older than 71 years of age. You are at high risk for heart disease. What should I know about cancer screening? Many types of cancers can be detected early and may often be prevented. Depending on your health history and family history, you may need to have cancer screening at various ages. This may include screening for: Colorectal cancer. Prostate cancer. Skin cancer. Lung  cancer. What should I know about heart disease, diabetes, and high blood pressure? Blood pressure and heart disease High blood pressure causes heart disease and increases the risk of stroke. This is more likely to develop in people who have high blood pressure readings, are of African descent, or are overweight. Talk with your health care provider about your target blood pressure readings. Have your blood pressure checked: Every 3-5 years if you are 18-39 years of age. Every year if you are 40 years old or older. If you are between the ages of 65 and 75 and are a current or former smoker, ask your health care provider if you should have a one-time screening for abdominal aortic aneurysm (AAA). Diabetes Have regular diabetes screenings. This checks your fasting blood sugar level. Have the screening done: Once every three years after age 45 if you are at a normal weight and have a low risk for diabetes. More often and at a younger age if you are overweight or have a high risk for diabetes. What should I know about preventing infection? Hepatitis B If you have a higher risk for hepatitis B, you should be screened for this virus. Talk with your health care provider to find out if you are at risk forhepatitis B infection. Hepatitis C Blood testing is recommended for: Everyone born from 1945 through 1965. Anyone with known risk factors for hepatitis C. Sexually transmitted infections (STIs) You should be screened each year for STIs, including gonorrhea and chlamydia, if: You are sexually active and are younger than 71 years of age. You are older than 71 years of age   and your health care provider tells you that you are at risk for this type of infection. Your sexual activity has changed since you were last screened, and you are at increased risk for chlamydia or gonorrhea. Ask your health care provider if you are at risk. Ask your health care provider about whether you are at high risk for HIV.  Your health care provider may recommend a prescription medicine to help prevent HIV infection. If you choose to take medicine to prevent HIV, you should first get tested for HIV. You should then be tested every 3 months for as long as you are taking the medicine. Follow these instructions at home: Lifestyle Do not use any products that contain nicotine or tobacco, such as cigarettes, e-cigarettes, and chewing tobacco. If you need help quitting, ask your health care provider. Do not use street drugs. Do not share needles. Ask your health care provider for help if you need support or information about quitting drugs. Alcohol use Do not drink alcohol if your health care provider tells you not to drink. If you drink alcohol: Limit how much you have to 0-2 drinks a day. Be aware of how much alcohol is in your drink. In the [place] one drink equals one 12 oz bottle of beer (355 mL), one 5 oz glass of wine (148 mL), or one 1 oz glass of hard liquor (44 mL). General instructions Schedule regular health, dental, and eye exams. Stay current with your vaccines. Tell your health care provider if: You often feel depressed. You have ever been abused or do not feel safe at home. Summary Adopting a healthy lifestyle and getting preventive care are important in promoting health and wellness. Follow your health care provider's instructions about healthy diet, exercising, and getting tested or screened for diseases. Follow your health care provider's instructions on monitoring your cholesterol and blood pressure. This information is not intended to replace advice given to you by your health care provider. Make sure you discuss any questions you have with your healthcare provider. Document Revised: 06/06/2018 Document Reviewed: 06/06/2018 Elsevier Patient Education  2022 Elsevier Inc.  

## 2021-02-22 NOTE — Progress Notes (Signed)
Subjective:  Patient ID: John Hogan., male    DOB: 1950/11/13  Age: 70 y.o. MRN: [id number]  CC: Hip Pain (Pt states chronic right sided pain over the last "couple of months" that was 1st intermittent, but seems more frequent over time.  Has not taken any meds in the past for this pain. States more discomfort with activity - reports as 7 on 0/10 pain scale.), handicap placard, Pain Management, Osteoarthritis, Hypertension, Congestive Heart Failure, and Annual Exam   HPI John Hogan. presents for a CPX and f/up -   He has chronic pain in his large joints but for the last 3 months he has had worsening pain in his right hip with activity.  There is no history of trauma.  He is not currently taking anything for pain.  He is active and denies any episodes of chest pain, shortness of breath, diaphoresis, dizziness, lightheadedness, or edema.  Outpatient Medications Prior to Visit  Medication Sig Dispense Refill   allopurinol (ZYLOPRIM) 300 MG tablet TAKE 1 TABLET BY MOUTH EVERY DAY 90 tablet 1   Cholecalciferol (VITAMIN D3) 50 MCG (2000 UT) capsule TAKE 1 CAPSULE BY MOUTH EVERY DAY 90 capsule 1   colchicine 0.6 MG tablet Take 1 tablet (0.6 mg total) by mouth daily. When having flare 5 tablet 2   pravastatin (PRAVACHOL) 20 MG tablet TAKE 1 TABLET BY MOUTH EVERY DAY 90 tablet 1   sildenafil (REVATIO) 20 MG tablet TAKE 4 TABLETS BY MOUTH DAILY AS NEEDED. 360 tablet 0   SIMBRINZA 1-0.2 % SUSP INSTILL 1 DROP INTO BOTH EYES TWICE A DAY  3   timolol (TIMOPTIC) 0.5 % ophthalmic solution PUT 1 DROP IN BOTH EYES TWICE A DAY  3   Timolol Maleate 0.5 % (DAILY) SOLN Apply 1 drop to eye 2 (two) times daily.     Travoprost, BAK Free, (TRAVATAN) 0.004 % SOLN ophthalmic solution Place 1 drop into both eyes at bedtime.      valsartan (DIOVAN) 80 MG tablet Take 1 tablet (80 mg total) by mouth daily. 30 tablet 6   zolpidem (AMBIEN) 5 MG tablet Take 1 tablet (5 mg total) by mouth at bedtime as needed for  sleep. 90 tablet 0   traMADol (ULTRAM) 50 MG tablet Take 1 tablet (50 mg total) by mouth every 6 (six) hours as needed. 20 tablet 0   triamcinolone cream (KENALOG) 0.1 % APPLY TO AFFECTED AREA TWICE A DAY (Patient not taking: Reported on 02/22/2021) 30 g 1   cyclobenzaprine (FLEXERIL) 10 MG tablet TAKE 1 TABLET BY MOUTH THREE TIMES A DAY AS NEEDED FOR MUSCLE SPASMS (Patient not taking: Reported on 02/22/2021) 90 tablet 1   torsemide (DEMADEX) 20 MG tablet TAKE 1 TABLET BY MOUTH EVERY DAY (Patient not taking: Reported on 02/22/2021) 90 tablet 0   No facility-administered medications prior to visit.    ROS Review of Systems  Constitutional:  Negative for diaphoresis and fatigue.  HENT: Negative.    Eyes: Negative.   Respiratory:  Negative for cough, chest tightness, shortness of breath and wheezing.   Cardiovascular:  Negative for chest pain, palpitations and leg swelling.  Gastrointestinal:  Negative for abdominal pain, constipation, diarrhea, nausea and vomiting.  Endocrine: Negative.   Genitourinary: Negative.  Negative for difficulty urinating and dysuria.  Musculoskeletal:  Positive for arthralgias. Negative for back pain, myalgias and neck pain.  Skin: Negative.  Negative for color change and pallor.  Neurological: Negative.  Negative for dizziness, seizures, weakness, light-headedness  and numbness.  Hematological:  Negative for adenopathy. Does not bruise/bleed easily.  Psychiatric/Behavioral: Negative.     Objective:  BP 138/82 (BP Location: Right Arm, Patient Position: Sitting, Cuff Size: Large)   Pulse 71   Temp 97.9 F (36.6 C) (Oral)   Resp 16   Ht 5\' 11"  (1.803 m)   Wt 228 lb 9.6 oz (103.7 kg)   SpO2 99%   BMI 31.88 kg/m   BP Readings from Last 3 Encounters:  02/22/21 138/82  10/20/20 125/77  09/07/20 (!) 138/96    Wt Readings from Last 3 Encounters:  02/22/21 228 lb 9.6 oz (103.7 kg)  10/20/20 227 lb 3.2 oz (103.1 kg)  09/07/20 228 lb (103.4 kg)    Physical  Exam Vitals reviewed.  HENT:     Nose: Nose normal.     Mouth/Throat:     Mouth: Mucous membranes are moist.  Eyes:     General: No scleral icterus.    Conjunctiva/sclera: Conjunctivae normal.  Cardiovascular:     Rate and Rhythm: Normal rate and regular rhythm.     Heart sounds: S1 normal and S2 normal. Murmur heard.  Diastolic murmur is present with a grade of 1/4.    No gallop.  Pulmonary:     Effort: Pulmonary effort is normal.     Breath sounds: No stridor. No wheezing, rhonchi or rales.  Abdominal:     General: Abdomen is flat.     Palpations: There is no mass.     Tenderness: There is no abdominal tenderness. There is no guarding or rebound.     Hernia: No hernia is present. There is no hernia in the left inguinal area or right inguinal area.  Genitourinary:    Pubic Area: No rash.      Penis: Normal and uncircumcised.      Testes: Normal.     Epididymis:     Right: Normal.     Left: Normal.     Prostate: Enlarged. Not tender and no nodules present.     Rectum: Normal. Guaiac result negative. No mass, tenderness, anal fissure, external hemorrhoid or internal hemorrhoid. Normal anal tone.  Musculoskeletal:     Cervical back: Neck supple.     Right lower leg: No edema.     Left lower leg: No edema.  Lymphadenopathy:     Cervical: No cervical adenopathy.     Lower Body: No right inguinal adenopathy. No left inguinal adenopathy.  Skin:    General: Skin is warm and dry.     Coloration: Skin is not pale.  Neurological:     General: No focal deficit present.     Mental Status: He is alert. Mental status is at baseline.  Psychiatric:        Mood and Affect: Mood normal.        Behavior: Behavior normal.    Lab Results  Component Value Date   WBC 3.6 (L) 02/22/2021   HGB 13.4 02/22/2021   HCT 39.8 02/22/2021   PLT 180.0 02/22/2021   GLUCOSE 89 02/22/2021   CHOL 161 02/22/2021   TRIG 92.0 02/22/2021   HDL 55.60 02/22/2021   LDLDIRECT 140.7 02/27/2013   LDLCALC  87 02/22/2021   ALT 10 02/22/2021   AST 13 02/22/2021   NA 141 02/22/2021   K 3.7 02/22/2021   CL 103 02/22/2021   CREATININE 1.94 (H) 02/22/2021   BUN 19 02/22/2021   CO2 29 02/22/2021   TSH 2.25 02/22/2021   PSA  2.39 02/22/2021    DG HIP UNILAT WITH PELVIS 2-3 VIEWS RIGHT  Result Date: 02/23/2021 CLINICAL DATA:  pain for 3 months EXAM: DG HIP (WITH OR WITHOUT PELVIS) 2-3V RIGHT COMPARISON:  None. FINDINGS: Normal alignment. No acute fracture. Moderate to severe degenerative changes of the right hip with loss of the normal joint space and subchondral sclerosis. There are more mild changes on the left side. Osteopenia. The soft tissues are unremarkable. IMPRESSION: Moderate to severe degenerative changes of the right hip joint. No acute fracture. Electronically Signed   By: Albin Felling M.D.   On: 02/23/2021 09:20     Assessment & Plan:   John Hogan was seen today for hip pain, handicap placard, pain management, osteoarthritis, hypertension, congestive heart failure and annual exam.  Diagnoses and all orders for this visit:  Stage 3a chronic kidney disease ([place])- His renal function is stable.  He will avoid nephrotoxic agents. -     CBC with Differential/Platelet; Future -     Basic metabolic panel; Future -     Basic metabolic panel -     CBC with Differential/Platelet  COMBINED HEART FAILURE, CHRONIC- He has a normal volume status -     torsemide (DEMADEX) 20 MG tablet; Take 1 tablet (20 mg total) by mouth daily.  Essential hypertension, benign- His blood pressure is adequately well controlled.  Electrolytes and renal function are normal. -     torsemide (DEMADEX) 20 MG tablet; Take 1 tablet (20 mg total) by mouth daily. -     CBC with Differential/Platelet; Future -     Basic metabolic panel; Future -     TSH; Future -     TSH -     Basic metabolic panel -     CBC with Differential/Platelet  Vitamin D deficiency -     VITAMIN D 25 Hydroxy (Vit-D Deficiency, Fractures);  Future -     VITAMIN D 25 Hydroxy (Vit-D Deficiency, Fractures)  Routine general medical examination at a health care facility- Exam completed, labs reviewed, vaccines reviewed and updated, cancer screenings are up-to-date, patient education was given.  Hyperlipidemia with target LDL less than 100- LDL goal achieved. Doing well on the statin  -     Lipid panel; Future -     TSH; Future -     Hepatic function panel; Future -     Hepatic function panel -     TSH -     Lipid panel  Flu vaccine need -     Flu Vaccine QUAD High Dose(Fluad)  Chronic pain of right hip- His plain film shows moderate to severe OA.  I recommended that he see orthopedics to see if he is a candidate for hip replacement surgery. -     Cancel: DG HIP UNILAT WITH PELVIS MIN 4 VIEWS RIGHT; Future -     Ambulatory referral to Orthopedic Surgery  Idiopathic gout, unspecified chronicity, unspecified site- His uric acid level is normal. -     Uric acid; Future -     Uric acid  Benign prostatic hyperplasia without lower urinary tract symptoms- His PSA is reassuring. -     PSA; Future -     Urinalysis, Routine w reflex microscopic; Future -     Urinalysis, Routine w reflex microscopic -     PSA  Primary osteoarthritis involving multiple joints -     traMADol (ULTRAM) 50 MG tablet; Take 1 tablet (50 mg total) by mouth every 6 (six) hours as  needed.  Need for shingles vaccine -     Zoster Vaccine Adjuvanted [facility]) injection; Inject 0.5 mLs into the muscle once for 1 dose.  I have discontinued Donzetta Starch Jr.'s cyclobenzaprine. I have also changed his torsemide. Additionally, I am having him start on Shingrix. Lastly, I am having him maintain his Travoprost (BAK Free), timolol, Simbrinza, Timolol Maleate (Once-Daily), allopurinol, sildenafil, triamcinolone cream, Vitamin D3, pravastatin, valsartan, colchicine, zolpidem, and traMADol.  Meds ordered this encounter  Medications   torsemide (DEMADEX) 20 MG tablet     Sig: Take 1 tablet (20 mg total) by mouth daily.    Dispense:  90 tablet    Refill:  0   traMADol (ULTRAM) 50 MG tablet    Sig: Take 1 tablet (50 mg total) by mouth every 6 (six) hours as needed.    Dispense:  90 tablet    Refill:  3   Zoster Vaccine Adjuvanted [facility]) injection    Sig: Inject 0.5 mLs into the muscle once for 1 dose.    Dispense:  0.5 mL    Refill:  1      Follow-up: Return in about 3 months (around 05/25/2021).  Scarlette Calico, MD

## 2021-03-10 ENCOUNTER — Ambulatory Visit (INDEPENDENT_AMBULATORY_CARE_PROVIDER_SITE_OTHER): Payer: 59 | Admitting: Orthopaedic Surgery

## 2021-03-10 ENCOUNTER — Encounter: Payer: Self-pay | Admitting: Orthopaedic Surgery

## 2021-03-10 ENCOUNTER — Other Ambulatory Visit: Payer: Self-pay

## 2021-03-10 DIAGNOSIS — M1611 Unilateral primary osteoarthritis, right hip: Secondary | ICD-10-CM | POA: Diagnosis not present

## 2021-03-10 DIAGNOSIS — M25551 Pain in right hip: Secondary | ICD-10-CM

## 2021-03-10 DIAGNOSIS — M7061 Trochanteric bursitis, right hip: Secondary | ICD-10-CM | POA: Diagnosis not present

## 2021-03-10 MED ORDER — LIDOCAINE HCL 1 % IJ SOLN
3.0000 mL | INTRAMUSCULAR | Status: AC | PRN
  Administered 2021-03-10: 3 mL

## 2021-03-10 MED ORDER — METHYLPREDNISOLONE ACETATE 40 MG/ML IJ SUSP
40.0000 mg | INTRAMUSCULAR | Status: AC | PRN
  Administered 2021-03-10: 40 mg via INTRA_ARTICULAR

## 2021-03-10 NOTE — Progress Notes (Signed)
Office Visit Note   Patient: John Hogan.           Date of Birth: 02/08/1951           MRN: [id number] Visit Date: 03/10/2021              Requested by: Janith Lima, MD [address] [place],   [id number] PCP: Janith Lima, MD   Assessment & Plan: Visit Diagnoses:  1. Pain in right hip   2. Unilateral primary osteoarthritis, right hip   3. Trochanteric bursitis, right hip     Plan: I did go over the x-rays of his right hip and explained that he does have severe arthritis of the right hip.  However, he seems to be compensating for things well so I am not recommending hip replacement as of yet nor does he wish to have one as of yet.  I did give him a handout about hip replacement surgery and talked to him about this in length in detail.  Since he is only tender over the lateral aspect of his right hip I did recommend a trochanteric steroid injection and he agreed to this and tolerated it well.  All questions and concerns were answered and addressed.  He knows to call us and come see [place] if things worsen or he wants to talk further about hip replacement surgery.  Follow-Up Instructions: Return if symptoms worsen or fail to improve.   Orders:  Orders Placed This Encounter  Procedures   Large Joint Inj   No orders of the defined types were placed in this encounter.     Procedures: Large Joint Inj: R greater trochanter on 03/10/2021 9:52 AM Indications: pain and diagnostic evaluation Details: 22 G 1.5 in needle, lateral approach  Arthrogram: No  Medications: 3 mL lidocaine 1 %; 40 mg methylPREDNISolone acetate 40 MG/ML Outcome: tolerated well, no immediate complications Procedure, treatment alternatives, risks and benefits explained, specific risks discussed. Consent was given by the patient. Immediately prior to procedure a time out was called to verify the correct patient, procedure, equipment, support staff and site/side marked as required. Patient was prepped  and draped in the usual sterile fashion.      Clinical Data: No additional findings.   Subjective: Chief Complaint  Patient presents with   Right Hip - Pain  The patient is a very pleasant 70 year old gentleman who comes in with right hip pain this been going on for about 4 5 months now with no groin pain.  He says when he looks back he may have had pain about 5 years ago as well.  He points to the lateral side of his hip as a source of his pain on the right side.  He does have a harder time putting his shoes and socks on on the right side comparing the right and left sides.  He denies any injury.  He said there is discomfort with walking.  He has no pain when sleeping.  He denies any back pain denies any numbness and tingling going down his right leg.  He denies any right knee pain.  He has stage III kidney disease so he cannot take anti-inflammatories.  HPI  Review of Systems There is currently listed no headache, chest pain, shortness of breath, fever, chills, nausea, vomiting  Objective: Vital Signs: There were no vitals taken for this visit.  Physical Exam He is alert and orient x3 and in no acute distress Ortho Exam Examination of  his left hip is entirely normal.  Examination his right hip does show some stiffness with internal and external rotation and pain in the groin but also pain on the lateral aspect of his hip over the trochanteric area and IT band proximally.  His knee exam is normal bilaterally. Specialty Comments:  No specialty comments available.  Imaging: No results found. X-rays independently reviewed on the canopy system of the pelvis and right hip show end-stage arthritis of the right hip which is severe.  There is almost complete loss of the joint space.  There are cystic changes and sclerotic changes in the femoral head and acetabulum.  There are also periarticular osteophytes.  PMFS History: Patient Active Problem List   Diagnosis Date Noted   Chronic pain  of right hip 02/22/2021   Flu vaccine need 02/22/2021   Grade I diastolic dysfunction 55/73/2202   Bradycardia 10/06/2019   Vitamin D deficiency 09/06/2017   CKD (chronic kidney disease) stage 3, GFR 30-59 ml/min (HCC) 08/07/2014   Insomnia 02/14/2014   Erectile dysfunction 02/27/2013   Papular atopic dermatitis 05/16/2012   Gout 02/16/2012   Routine general medical examination at a health care facility 02/16/2012   Hyperlipidemia with target LDL less than 100 02/16/2012   Aortic regurgitation 02/16/2012   Colon polyps 02/16/2012   Essential hypertension, benign 03/07/2009   Hypertensive heart disease without heart failure 03/07/2009   COMBINED HEART FAILURE, CHRONIC 03/07/2009   Past Medical History:  Diagnosis Date   AI (aortic insufficiency)    CHF (congestive heart failure) (HCC)    Chronic kidney disease    Glaucoma    Gout    H/O exercise stress test    Myoview 01/03/12: No scar or ischemia, EF 53%   Hyperlipidemia    Hypertension    Hypertensive cardiomyopathy (HCC)    EF previously 35%;  echo 12/2012 EF 55%, mild AI, ascending aorta mildly dilated, mild LAE    Family History  Problem Relation Age of Onset   Hypertension Father    Colon cancer Father 48   Hyperlipidemia Father    Heart disease Father    Diabetes Father    Stroke Mother        deceased   Colon cancer Mother 21   Arthritis Mother    Hyperlipidemia Mother    Hypertension Mother    Diabetes Mother    Colon polyps Sister    Colon polyps Brother    COPD Neg Hx    Alcohol abuse Neg Hx    Kidney disease Neg Hx    Esophageal cancer Neg Hx    Rectal cancer Neg Hx    Stomach cancer Neg Hx     Past Surgical History:  Procedure Laterality Date   COLONOSCOPY     CYST REMOVAL HAND     HERNIA REPAIR     Umbilical   MASS EXCISION Left 08/04/2020   Procedure: EXCISION MASS LEFT PALM;  Surgeon: Daryll Brod, MD;  Location: [place];  Service: Orthopedics;  Laterality: Left;   POLYPECTOMY      WRIST SURGERY     right   Social History   Occupational History    Employer: [employer]  Tobacco Use   Smoking status: Former    Packs/day: 1.00    Years: 2.00    Pack years: 2.00    Types: Cigarettes    Quit date: 01/10/1975    Years since quitting: 46.1   Smokeless tobacco: Never   Tobacco comments:  Only smoked two years  Vaping Use   Vaping Use: Never used  Substance and Sexual Activity   Alcohol use: No   Drug use: No   Sexual activity: Yes

## 2021-03-18 ENCOUNTER — Other Ambulatory Visit: Payer: Self-pay | Admitting: Internal Medicine

## 2021-03-18 DIAGNOSIS — F5104 Psychophysiologic insomnia: Secondary | ICD-10-CM

## 2021-03-18 NOTE — Telephone Encounter (Signed)
   Patient states last refill he was only given 30 tablets  Requesting refill zolpidem (AMBIEN) 5 MG tablet 90 tablet 0 02/15/2021    Sig - Route: Take 1 tablet (5 mg total) by mouth at bedtime as needed for sleep. - Oral   Sent to pharmacy as: zolpidem (AMBIEN) 5 MG tablet

## 2021-04-07 ENCOUNTER — Other Ambulatory Visit: Payer: Self-pay | Admitting: Internal Medicine

## 2021-04-07 DIAGNOSIS — I10 Essential (primary) hypertension: Secondary | ICD-10-CM

## 2021-04-07 DIAGNOSIS — I5042 Chronic combined systolic (congestive) and diastolic (congestive) heart failure: Secondary | ICD-10-CM

## 2021-05-08 ENCOUNTER — Other Ambulatory Visit: Payer: Self-pay | Admitting: Internal Medicine

## 2021-05-08 DIAGNOSIS — I5042 Chronic combined systolic (congestive) and diastolic (congestive) heart failure: Secondary | ICD-10-CM

## 2021-05-08 DIAGNOSIS — I10 Essential (primary) hypertension: Secondary | ICD-10-CM

## 2021-05-10 ENCOUNTER — Encounter: Payer: Self-pay | Admitting: Orthopaedic Surgery

## 2021-05-10 ENCOUNTER — Ambulatory Visit (INDEPENDENT_AMBULATORY_CARE_PROVIDER_SITE_OTHER): Payer: 59 | Admitting: Orthopaedic Surgery

## 2021-05-10 DIAGNOSIS — M1611 Unilateral primary osteoarthritis, right hip: Secondary | ICD-10-CM | POA: Diagnosis not present

## 2021-05-10 DIAGNOSIS — M7061 Trochanteric bursitis, right hip: Secondary | ICD-10-CM

## 2021-05-10 DIAGNOSIS — M25551 Pain in right hip: Secondary | ICD-10-CM

## 2021-05-10 NOTE — Progress Notes (Signed)
The patient is 70 year old gentleman well-known to me.  He has severe debilitating arthritis of his right hip.  We have tried conservative treatment including trochanteric bursa injections and an intra-articular injection in his right hip joint.  His x-rays show bone-on-bone arthritis with complete loss of joint space as well as osteophytes around the hip and sclerotic changes.  At this point he does feel that the conservative treatment is not work for him.  The injections did not last long and were only of little help.  He has tried offload that hip.  I have already talked to him in the past about hip replacement surgery showing him his x-rays and a hip replacement model.  He has had a handout about it as well.  He comes in today wanting just to go ahead and set up surgery for a right hip replacement.  He has had no acute change in medical status.  He is not on blood thinning medications either.  His left hip moves smoothly and fluidly.  His right hip has pain with internal extra rotation as well as stiffness with rotation.  I did look at the x-rays again of his right hip confirming severe end-stage arthritis of the right hip.  Of note he denies any headache, chest pain, shortness of breath, fever, chills, nausea, vomiting.  I agree with setting him up at this point for a right total hip arthroplasty.  Again we talked about the risk and benefits of surgery and what to expect from an intraoperative and postoperative course.  All questions and concerns were answered and addressed.  We will work on getting this scheduled.

## 2021-06-08 ENCOUNTER — Other Ambulatory Visit: Payer: Self-pay

## 2021-07-02 ENCOUNTER — Telehealth: Payer: Self-pay

## 2021-07-02 ENCOUNTER — Other Ambulatory Visit: Payer: Self-pay | Admitting: Internal Medicine

## 2021-07-02 DIAGNOSIS — M1000 Idiopathic gout, unspecified site: Secondary | ICD-10-CM

## 2021-07-02 MED ORDER — ALLOPURINOL 300 MG PO TABS: 300.0000 mg | ORAL_TABLET | Freq: Every day | ORAL | 0 refills | Status: DC

## 2021-07-02 NOTE — Telephone Encounter (Signed)
Pt calling in requesting a refill on: allopurinol (ZYLOPRIM) 300 MG tablet  LOV 02/22/21 Next OV 08/02/21  Pt contact number [phone]

## 2021-07-06 NOTE — Progress Notes (Signed)
Sent message, via epic in basket, requesting orders in epic from surgeon.  

## 2021-07-08 ENCOUNTER — Telehealth: Payer: Self-pay | Admitting: Orthopaedic Surgery

## 2021-07-08 NOTE — Telephone Encounter (Signed)
Pt submitted medical release form, short term disability forms, and $25.00 cash payment. Accepted 07/08/21

## 2021-07-11 ENCOUNTER — Other Ambulatory Visit: Payer: Self-pay | Admitting: Internal Medicine

## 2021-07-11 DIAGNOSIS — E785 Hyperlipidemia, unspecified: Secondary | ICD-10-CM

## 2021-07-11 DIAGNOSIS — I5042 Chronic combined systolic (congestive) and diastolic (congestive) heart failure: Secondary | ICD-10-CM

## 2021-07-11 DIAGNOSIS — I10 Essential (primary) hypertension: Secondary | ICD-10-CM

## 2021-07-13 ENCOUNTER — Other Ambulatory Visit: Payer: Self-pay | Admitting: Physician Assistant

## 2021-07-13 DIAGNOSIS — M1611 Unilateral primary osteoarthritis, right hip: Secondary | ICD-10-CM

## 2021-07-16 NOTE — Patient Instructions (Addendum)
DUE TO COVID-19 ONLY ONE VISITOR IS ALLOWED TO COME WITH YOU AND STAY IN THE WAITING ROOM ONLY DURING PRE OP AND PROCEDURE.   **NO VISITORS ARE ALLOWED IN THE SHORT STAY AREA OR RECOVERY ROOM!!**  IF YOU WILL BE ADMITTED INTO THE HOSPITAL YOU ARE ALLOWED ONLY TWO SUPPORT PEOPLE DURING VISITATION HOURS ONLY (7 AM -8PM)   The support person(s) must pass our screening, gel in and out, and wear a mask at all times, including in the patients room. Patients must also wear a mask when staff or their support person are in the room. Visitors GUEST BADGE MUST BE WORN VISIBLY  One adult visitor may remain with you overnight and MUST be in the room by 8 P.M.  No visitors under the age of 27. Any visitor under the age of 52 must be accompanied by an adult.    COVID SWAB TESTING MUST BE COMPLETED ON:  07/21/21 @ 9:30 AM   Site: [facility] [place] [place] Enter: Main Entrance have a seat in the waiting area to the right of main entrance (DO NOT [place]!!!!!) Dial: [phone] to alert staff you have arrived  You are not required to quarantine, however you are required to wear a well-fitted mask when you are out and around people not in your household.  Hand Hygiene often Do NOT share personal items Notify your provider if you are in close contact with someone who has COVID or you develop fever 100.4 or greater, new onset of sneezing, cough, sore throat, shortness of breath or body aches.       Your procedure is scheduled on: 07/23/21   Report to [facility] Main Entrance    Report to admitting at 8:15 AM   Call this number if you have problems the morning of surgery [phone]   Do not eat food :After Midnight.   May have liquids until 8:00 AM day of surgery  CLEAR LIQUID DIET  Foods Allowed                                                                     Foods Excluded  Water, Black Coffee and tea (no milk or creamer)            liquids  that you cannot  Plain Jell-O in any flavor  (No red)                                     see through such as: Fruit ices (not with fruit pulp)                                             milk, soups, orange juice              Iced Popsicles (No red)  All solid food                                   Apple juices Sports drinks like Gatorade (No red) Lightly seasoned clear broth or consume(fat free) Sugar    The day of surgery:  Drink ONE (1) Pre-Surgery Clear Ensure at 8:00 AM the morning of surgery. Drink in one sitting. Do not sip.  This drink was given to you during your hospital  pre-op appointment visit. Nothing else to drink after completing the  Pre-Surgery Clear Ensure.          If you have questions, please contact your surgeons office.     Oral Hygiene is also important to reduce your risk of infection.                                    Remember - BRUSH YOUR TEETH THE MORNING OF SURGERY WITH YOUR REGULAR TOOTHPASTE   Stop all vitamins and supplements 5 days before surgery   Take these medicines the morning of surgery with A SIP OF WATER: Allopurinol, Pravastatin, Timolol, Tramadol                              You may not have any metal on your body including jewelry, and body piercing             Do not wear lotions, powders, cologne, or deodorant              Men may shave face and neck.   Do not bring valuables to the hospital. [place].   Contacts, dentures or bridgework may not be worn into surgery.   Bring small overnight bag day of surgery.   Special Instructions: Bring a copy of your healthcare power of attorney and living will documents         the day of surgery if you haven't scanned them before.              Please read over the following fact sheets you were given: IF YOU HAVE QUESTIONS ABOUT YOUR PRE-OP INSTRUCTIONS PLEASE CALL [facility] - Preparing for Surgery Before surgery, you can play an important role.  Because skin is not sterile, your skin needs to be as free of germs as possible.  You can reduce the number of germs on your skin by washing with CHG (chlorahexidine gluconate) soap before surgery.  CHG is an antiseptic cleaner which kills germs and bonds with the skin to continue killing germs even after washing. Please DO NOT use if you have an allergy to CHG or antibacterial soaps.  If your skin becomes reddened/irritated stop using the CHG and inform your nurse when you arrive at Short Stay. Do not shave (including legs and underarms) for at least 48 hours prior to the first CHG shower.  You may shave your face/neck.  Please follow these instructions carefully:  1.  Shower with CHG Soap the night before surgery and the  morning of surgery.  2.  If you choose to wash your hair, wash your hair first as usual with your normal  shampoo.  3.  After you shampoo, rinse your hair and body thoroughly to remove the shampoo.                             4.  Use CHG as you would any other liquid soap.  You can apply chg directly to the skin and wash.  Gently with a scrungie or clean washcloth.  5.  Apply the CHG Soap to your body ONLY FROM THE NECK DOWN.   Do   not use on face/ open                           Wound or open sores. Avoid contact with eyes, ears mouth and   genitals (private parts).                       Wash face,  Genitals (private parts) with your normal soap.             6.  Wash thoroughly, paying special attention to the area where your    surgery  will be performed.  7.  Thoroughly rinse your body with warm water from the neck down.  8.  DO NOT shower/wash with your normal soap after using and rinsing off the CHG Soap.                9.  Pat yourself dry with a clean towel.            10.  Wear clean pajamas.            11.  Place clean sheets on your bed the night of your first shower and do not  sleep  with pets. Day of Surgery : Do not apply any lotions/deodorants the morning of surgery.  Please wear clean clothes to the hospital/surgery center.  FAILURE TO FOLLOW THESE INSTRUCTIONS MAY RESULT IN THE CANCELLATION OF YOUR SURGERY  PATIENT SIGNATURE_________________________________  NURSE SIGNATURE__________________________________  ________________________________________________________________________   John Hogan  An incentive spirometer is a tool that can help keep your lungs clear and active. This tool measures how well you are filling your lungs with each breath. Taking long deep breaths may help reverse or decrease the chance of developing breathing (pulmonary) problems (especially infection) following: A long period of time when you are unable to move or be active. BEFORE THE PROCEDURE  If the spirometer includes an indicator to show your best effort, your nurse or respiratory therapist will set it to a desired goal. If possible, sit up straight or lean slightly forward. Try not to slouch. Hold the incentive spirometer in an upright position. INSTRUCTIONS FOR USE  Sit on the edge of your bed if possible, or sit up as far as you can in bed or on a chair. Hold the incentive spirometer in an upright position. Breathe out normally. Place the mouthpiece in your mouth and seal your lips tightly around it. Breathe in slowly and as deeply as possible, raising the piston or the ball toward the top of the column. Hold your breath for 3-5 seconds or for as long as possible. Allow the piston or ball to fall to the bottom of the column. Remove the mouthpiece from your mouth and breathe out normally. Rest for a few seconds and repeat Steps 1 through 7 at least 10 times every 1-2 hours when you are awake. Take your time and take a few  normal breaths between deep breaths. The spirometer may include an indicator to show your best effort. Use the indicator as a goal to work toward  during each repetition. After each set of 10 deep breaths, practice coughing to be sure your lungs are clear. If you have an incision (the cut made at the time of surgery), support your incision when coughing by placing a pillow or rolled up towels firmly against it. Once you are able to get out of bed, walk around indoors and cough well. You may stop using the incentive spirometer when instructed by your caregiver.  RISKS AND COMPLICATIONS Take your time so you do not get dizzy or light-headed. If you are in pain, you may need to take or ask for pain medication before doing incentive spirometry. It is harder to take a deep breath if you are having pain. AFTER USE Rest and breathe slowly and easily. It can be helpful to keep track of a log of your progress. Your caregiver can provide you with a simple table to help with this. If you are using the spirometer at home, follow these instructions: [place] IF:  You are having difficultly using the spirometer. You have trouble using the spirometer as often as instructed. Your pain medication is not giving enough relief while using the spirometer. You develop fever of 100.5 F (38.1 C) or higher. SEEK IMMEDIATE MEDICAL CARE IF:  You cough up bloody sputum that had not been present before. You develop fever of 102 F (38.9 C) or greater. You develop worsening pain at or near the incision site. MAKE SURE YOU:  Understand these instructions. Will watch your condition. Will get help right away if you are not doing well or get worse. Document Released: 10/24/2006 Document Revised: 09/05/2011 Document Reviewed: 12/25/2006 ExitCare Patient Information 2014 ExitCare, [place].   ________________________________________________________________________  WHAT IS A BLOOD TRANSFUSION? Blood Transfusion Information  A transfusion is the replacement of blood or some of its parts. Blood is made up of multiple cells which provide different  functions. Red blood cells carry oxygen and are used for blood loss replacement. White blood cells fight against infection. Platelets control bleeding. Plasma helps clot blood. Other blood products are available for specialized needs, such as hemophilia or other clotting disorders. BEFORE THE TRANSFUSION  Who gives blood for transfusions?  Healthy volunteers who are fully evaluated to make sure their blood is safe. This is blood bank blood. Transfusion therapy is the safest it has ever been in the practice of medicine. Before blood is taken from a donor, a complete history is taken to make sure that person has no history of diseases nor engages in risky social behavior (examples are intravenous drug use or sexual activity with multiple partners). The donor's travel history is screened to minimize risk of transmitting infections, such as malaria. The donated blood is tested for signs of infectious diseases, such as HIV and hepatitis. The blood is then tested to be sure it is compatible with you in order to minimize the chance of a transfusion reaction. If you or a relative donates blood, this is often done in anticipation of surgery and is not appropriate for emergency situations. It takes many days to process the donated blood. RISKS AND COMPLICATIONS Although transfusion therapy is very safe and saves many lives, the main dangers of transfusion include:  Getting an infectious disease. Developing a transfusion reaction. This is an allergic reaction to something in the blood you were given. Every precaution is  taken to prevent this. The decision to have a blood transfusion has been considered carefully by your caregiver before blood is given. Blood is not given unless the benefits outweigh the risks. AFTER THE TRANSFUSION Right after receiving a blood transfusion, you will usually feel much better and more energetic. This is especially true if your red blood cells have gotten low (anemic). The  transfusion raises the level of the red blood cells which carry oxygen, and this usually causes an energy increase. The nurse administering the transfusion will monitor you carefully for complications. HOME CARE INSTRUCTIONS  No special instructions are needed after a transfusion. You may find your energy is better. Speak with your caregiver about any limitations on activity for underlying diseases you may have. SEEK MEDICAL CARE IF:  Your condition is not improving after your transfusion. You develop redness or irritation at the intravenous (IV) site. SEEK IMMEDIATE MEDICAL CARE IF:  Any of the following symptoms occur over the next 12 hours: Shaking chills. You have a temperature by mouth above 102 F (38.9 C), not controlled by medicine. Chest, back, or muscle pain. People around you feel you are not acting correctly or are confused. Shortness of breath or difficulty breathing. Dizziness and fainting. You get a rash or develop hives. You have a decrease in urine output. Your urine turns a dark color or changes to pink, red, or brown. Any of the following symptoms occur over the next 10 days: You have a temperature by mouth above 102 F (38.9 C), not controlled by medicine. Shortness of breath. Weakness after normal activity. The white part of the eye turns yellow (jaundice). You have a decrease in the amount of urine or are urinating less often. Your urine turns a dark color or changes to pink, red, or brown. Document Released: 06/10/2000 Document Revised: 09/05/2011 Document Reviewed: 01/28/2008 [facility] Patient Information 2014 [place], [place].  _______________________________________________________________________

## 2021-07-16 NOTE — Progress Notes (Addendum)
COVID swab appointment: 07/21/21 @ 0930  COVID Vaccine Completed: yes x5 Date COVID Vaccine completed: Has received booster: COVID vaccine manufacturer: [place]   Date of COVID positive in last 90 days: no  PCP - John Calico, MD Cardiologist - Minus Breeding, MD  Chest x-ray -  EKG - 10/20/20 Epic Stress Test - 2013 ECHO - 10/22/19 Epic Cardiac Cath - n/a Pacemaker/ICD device last checked: n/a Spinal Cord Stimulator: n/a  Sleep Study - n/a CPAP -   Fasting Blood Sugar - n/a Checks Blood Sugar _____ times a day  Blood Thinner Instructions: n/a Aspirin Instructions: Last Dose:  Activity level: Can go up a flight of stairs and perform activities of daily living without stopping and without symptoms of chest pain or shortness of breath.      Anesthesia review: HTN, CKD, HF, creatinine 1.96 at PAT  Patient denies shortness of breath, fever, cough and chest pain at PAT appointment   Patient verbalized understanding of instructions that were given to them at the PAT appointment. Patient was also instructed that they will need to review over the PAT instructions again at home before surgery.

## 2021-07-19 ENCOUNTER — Other Ambulatory Visit: Payer: Self-pay

## 2021-07-19 ENCOUNTER — Encounter (HOSPITAL_COMMUNITY): Payer: Self-pay

## 2021-07-19 ENCOUNTER — Encounter (HOSPITAL_COMMUNITY)
Admission: RE | Admit: 2021-07-19 | Discharge: 2021-07-19 | Disposition: A | Discharge status: Home / Self Care | Payer: 59 | Source: Ambulatory Visit | Attending: Orthopaedic Surgery | Admitting: Orthopaedic Surgery

## 2021-07-19 VITALS — BP 163/89 | HR 73 | Temp 97.8°F | Resp 18 | Ht 71.0 in | Wt 220.0 lb

## 2021-07-19 DIAGNOSIS — Z01818 Encounter for other preprocedural examination: Secondary | ICD-10-CM

## 2021-07-19 DIAGNOSIS — M1611 Unilateral primary osteoarthritis, right hip: Secondary | ICD-10-CM | POA: Diagnosis not present

## 2021-07-19 DIAGNOSIS — N189 Chronic kidney disease, unspecified: Secondary | ICD-10-CM | POA: Diagnosis not present

## 2021-07-19 DIAGNOSIS — Z01812 Encounter for preprocedural laboratory examination: Secondary | ICD-10-CM | POA: Diagnosis present

## 2021-07-19 DIAGNOSIS — I509 Heart failure, unspecified: Secondary | ICD-10-CM | POA: Diagnosis not present

## 2021-07-19 DIAGNOSIS — Z87891 Personal history of nicotine dependence: Secondary | ICD-10-CM | POA: Diagnosis not present

## 2021-07-19 DIAGNOSIS — I351 Nonrheumatic aortic (valve) insufficiency: Secondary | ICD-10-CM | POA: Diagnosis not present

## 2021-07-19 DIAGNOSIS — I130 Hypertensive heart and chronic kidney disease with heart failure and stage 1 through stage 4 chronic kidney disease, or unspecified chronic kidney disease: Secondary | ICD-10-CM | POA: Diagnosis not present

## 2021-07-19 DIAGNOSIS — I10 Essential (primary) hypertension: Secondary | ICD-10-CM

## 2021-07-19 HISTORY — DX: Unspecified osteoarthritis, unspecified site: M19.90

## 2021-07-19 LAB — CBC
HCT: 42 % (ref 39.0–52.0)
Hemoglobin: 13.7 g/dL (ref 13.0–17.0)
MCH: 31 pg (ref 26.0–34.0)
MCHC: 32.6 g/dL (ref 30.0–36.0)
MCV: 95 fL (ref 80.0–100.0)
Platelets: 178 10*3/uL (ref 150–400)
RBC: 4.42 MIL/uL (ref 4.22–5.81)
RDW: 13.1 % (ref 11.5–15.5)
WBC: 3.6 10*3/uL — ABNORMAL LOW (ref 4.0–10.5)
nRBC: 0 % (ref 0.0–0.2)

## 2021-07-19 LAB — SURGICAL PCR SCREEN
MRSA, PCR: NEGATIVE
Staphylococcus aureus: NEGATIVE

## 2021-07-19 LAB — BASIC METABOLIC PANEL
Anion gap: 8 (ref 5–15)
BUN: 31 mg/dL — ABNORMAL HIGH (ref 8–23)
CO2: 27 mmol/L (ref 22–32)
Calcium: 9 mg/dL (ref 8.9–10.3)
Chloride: 105 mmol/L (ref 98–111)
Creatinine, Ser: 1.96 mg/dL — ABNORMAL HIGH (ref 0.61–1.24)
GFR, Estimated: 36 mL/min — ABNORMAL LOW (ref >60)
Glucose, Bld: 107 mg/dL — ABNORMAL HIGH (ref 70–99)
Potassium: 3.7 mmol/L (ref 3.5–5.1)
Sodium: 140 mmol/L (ref 135–145)

## 2021-07-20 NOTE — Progress Notes (Signed)
Anesthesia Chart Review   Case: [id number] Date/Time: 07/23/21 1045   Procedure: Right HIP ARTHROPLASTY ANTERIOR APPROACH (Right: Hip)   Anesthesia type: Spinal   Pre-op diagnosis: Right hip osteoarthritis, degenerative joint diease   Location: [facility] ROOM 10 / [facility]   Surgeons: Mcarthur Rossetti, MD       DISCUSSION:70 y.o. former smoker with h/o HTN, CHF, aortic insufficiency, CKD creatinine stable, right hip OA scheduled for above procedure 07/23/2021 with Dr. Jean Rosenthal.   Last seen by cardiology 10/20/2020. Stable at this visit with one year follow up recommended.   Anticipate pt can proceed with planned procedure barring acute status change.   VS: BP (!) 163/89    Pulse 73    Temp 36.6 C (Oral)    Resp 18    Ht 5\' 11"  (1.803 m)    Wt 99.8 kg    SpO2 100%    BMI 30.68 kg/m   PROVIDERS: Janith Lima, MD is PCP   Minus Breeding, MD is Cardiologist  LABS: Labs reviewed: Acceptable for surgery. (all labs ordered are listed, but only abnormal results are displayed)  Labs Reviewed  CBC - Abnormal; Notable for the following components:      Result Value   WBC 3.6 (*)    All other components within normal limits  BASIC METABOLIC PANEL - Abnormal; Notable for the following components:   Glucose, Bld 107 (*)    BUN 31 (*)    Creatinine, Ser 1.96 (*)    GFR, Estimated 36 (*)    All other components within normal limits  SURGICAL PCR SCREEN     IMAGES:   EKG: 10/20/2020 Rate 57 bpm  Sinus bradycardia with sinus arrhythmia  Left axis deviation   CV: Echo 10/22/2019 1. Left ventricular ejection fraction, by estimation, is 60 to 65%. The  left ventricle has normal function. The left ventricle has no regional  wall motion abnormalities. There is mild left ventricular hypertrophy.  Left ventricular diastolic parameters  are consistent with Grade I diastolic dysfunction (impaired relaxation).   2. Right ventricular systolic function is normal. The right  ventricular  size is normal.   3. The mitral valve is grossly normal. No evidence of mitral valve  regurgitation.   4. The aortic valve is tricuspid. Aortic valve regurgitation is mild to  moderate.   5. Aortic dilatation noted. There is mild dilatation of the ascending  aorta and of the aortic root measuring 43 mm.   6. The inferior vena cava is normal in size with greater than 50%  respiratory variability, suggesting right atrial pressure of 3 mmHg. Past Medical History:  Diagnosis Date   AI (aortic insufficiency)    Arthritis    CHF (congestive heart failure) (HCC)    Chronic kidney disease    Glaucoma    Gout    H/O exercise stress test    Myoview 01/03/12: No scar or ischemia, EF 53%   Hyperlipidemia    Hypertension    Hypertensive cardiomyopathy (HCC)    EF previously 35%;  echo 12/2012 EF 55%, mild AI, ascending aorta mildly dilated, mild LAE    Past Surgical History:  Procedure Laterality Date   COLONOSCOPY     CYST REMOVAL HAND     HERNIA REPAIR     Umbilical   MASS EXCISION Left 08/04/2020   Procedure: EXCISION MASS LEFT PALM;  Surgeon: Daryll Brod, MD;  Location: [place];  Service: Orthopedics;  Laterality: Left;  POLYPECTOMY     WISDOM TOOTH EXTRACTION     WRIST SURGERY     right    MEDICATIONS:  allopurinol (ZYLOPRIM) 300 MG tablet   Cholecalciferol (VITAMIN D3) 50 MCG (2000 UT) capsule   colchicine 0.6 MG tablet   pravastatin (PRAVACHOL) 20 MG tablet   sildenafil (REVATIO) 20 MG tablet   SIMBRINZA 1-0.2 % SUSP   timolol (TIMOPTIC) 0.5 % ophthalmic solution   torsemide (DEMADEX) 20 MG tablet   traMADol (ULTRAM) 50 MG tablet   Travoprost, BAK Free, (TRAVATAN) 0.004 % SOLN ophthalmic solution   triamcinolone cream (KENALOG) 0.1 %   valsartan (DIOVAN) 80 MG tablet   zolpidem (AMBIEN) 5 MG tablet   No current facility-administered medications for this encounter.     Konrad Felix Ward, PA-C [facility] Pre-Surgical Testing [phone]

## 2021-07-21 ENCOUNTER — Encounter (HOSPITAL_COMMUNITY)
Admission: RE | Admit: 2021-07-21 | Discharge: 2021-07-21 | Disposition: A | Discharge status: Home / Self Care | Payer: 59 | Source: Ambulatory Visit | Attending: Orthopaedic Surgery | Admitting: Orthopaedic Surgery

## 2021-07-21 ENCOUNTER — Other Ambulatory Visit: Payer: Self-pay

## 2021-07-21 DIAGNOSIS — Z01812 Encounter for preprocedural laboratory examination: Secondary | ICD-10-CM | POA: Diagnosis not present

## 2021-07-21 DIAGNOSIS — Z20822 Contact with and (suspected) exposure to covid-19: Secondary | ICD-10-CM | POA: Diagnosis not present

## 2021-07-21 LAB — SARS CORONAVIRUS 2 (TAT 6-24 HRS): SARS Coronavirus 2: POSITIVE — AB

## 2021-07-23 ENCOUNTER — Encounter: Payer: Self-pay | Admitting: Internal Medicine

## 2021-07-23 ENCOUNTER — Telehealth (INDEPENDENT_AMBULATORY_CARE_PROVIDER_SITE_OTHER): Payer: 59 | Admitting: Internal Medicine

## 2021-07-23 DIAGNOSIS — U071 COVID-19: Secondary | ICD-10-CM

## 2021-07-23 LAB — TYPE AND SCREEN
ABO/RH(D): B NEG
Antibody Screen: NEGATIVE

## 2021-07-23 MED ORDER — MOLNUPIRAVIR EUA 200MG CAPSULE: 4.0000 | ORAL_CAPSULE | Freq: Two times a day (BID) | ORAL | 0 refills | Status: AC

## 2021-07-23 MED ORDER — BENZONATATE 200 MG PO CAPS: 200.0000 mg | ORAL_CAPSULE | Freq: Three times a day (TID) | ORAL | 0 refills | Status: DC | PRN

## 2021-07-23 NOTE — Progress Notes (Signed)
Virtual Visit via Video Note  I connected with John Hogan. on 07/23/21 at  3:00 PM EST by a video enabled telemedicine application and verified that I am speaking with the correct person using two identifiers.  The patient and the provider were at separate locations throughout the entire encounter. Patient location: home, Provider location: work   I discussed the limitations of evaluation and management by telemedicine and the availability of in person appointments. The patient expressed understanding and agreed to proceed. The patient and the provider were the only parties present for the visit unless noted in HPI below.  History of Present Illness: The patient is a 71 y.o. man with visit for covid-19 positive 07/12/21. Started 4 days ago.   Observations/Objective: Appearance: normal, breathing appears normal, no coughing during visit and no dyspnea during visit, casual grooming, mental status is A and O times 3  Assessment and Plan: See problem oriented charting  Follow Up Instructions: rx molnupiravir and tessalon perles  I discussed the assessment and treatment plan with the patient. The patient was provided an opportunity to ask questions and all were answered. The patient agreed with the plan and demonstrated an understanding of the instructions.   The patient was advised to call back or seek an in-person evaluation if the symptoms worsen or if the condition fails to improve as anticipated.  Hoyt Koch, MD

## 2021-07-23 NOTE — Assessment & Plan Note (Addendum)
Rx molnupiravir and tessalon perles. Advised of CDC guidelines for quarantine. He has had 4 covid-19 vaccines including recent bivalent booster.

## 2021-08-02 ENCOUNTER — Other Ambulatory Visit: Payer: Self-pay

## 2021-08-02 ENCOUNTER — Encounter: Payer: Self-pay | Admitting: Internal Medicine

## 2021-08-02 ENCOUNTER — Ambulatory Visit (INDEPENDENT_AMBULATORY_CARE_PROVIDER_SITE_OTHER): Payer: 59 | Admitting: Internal Medicine

## 2021-08-02 VITALS — BP 152/84 | HR 54 | Temp 97.8°F | Resp 16 | Ht 71.0 in | Wt 229.2 lb

## 2021-08-02 DIAGNOSIS — I351 Nonrheumatic aortic (valve) insufficiency: Secondary | ICD-10-CM

## 2021-08-02 DIAGNOSIS — I10 Essential (primary) hypertension: Secondary | ICD-10-CM

## 2021-08-02 DIAGNOSIS — I5042 Chronic combined systolic (congestive) and diastolic (congestive) heart failure: Secondary | ICD-10-CM | POA: Diagnosis not present

## 2021-08-02 MED ORDER — SPIRONOLACTONE 25 MG PO TABS: 25.0000 mg | ORAL_TABLET | Freq: Every day | ORAL | 0 refills | Status: DC

## 2021-08-02 MED ORDER — VALSARTAN 80 MG PO TABS: 80.0000 mg | ORAL_TABLET | Freq: Every day | ORAL | 0 refills | Status: DC

## 2021-08-02 NOTE — Patient Instructions (Signed)

## 2021-08-02 NOTE — Progress Notes (Signed)
Subjective:  Patient ID: John Pecina., male    DOB: 01/06/1951  Age: 71 y.o. MRN: [id number]  CC: Hypertension and Congestive Heart Failure  This visit occurred during the SARS-CoV-2 public health emergency.  Safety protocols were in place, including screening questions prior to the visit, additional usage of staff PPE, and extensive cleaning of exam room while observing appropriate contact time as indicated for disinfecting solutions.    HPI John Hogan. presents for f/up -  He is active and denies chest pain, shortness of breath, diaphoresis, edema, or fatigue.  Outpatient Medications Prior to Visit  Medication Sig Dispense Refill   allopurinol (ZYLOPRIM) 300 MG tablet Take 1 tablet (300 mg total) by mouth daily. 90 tablet 0   benzonatate (TESSALON) 200 MG capsule Take 1 capsule (200 mg total) by mouth 3 (three) times daily as needed. 60 capsule 0   Cholecalciferol (VITAMIN D3) 50 MCG (2000 UT) capsule TAKE 1 CAPSULE BY MOUTH EVERY DAY 90 capsule 1   colchicine 0.6 MG tablet Take 1 tablet (0.6 mg total) by mouth daily. When having flare (Patient taking differently: Take 0.6 mg by mouth daily as needed. When having flare) 5 tablet 2   pravastatin (PRAVACHOL) 20 MG tablet TAKE 1 TABLET BY MOUTH EVERY DAY 90 tablet 0   sildenafil (REVATIO) 20 MG tablet TAKE 4 TABLETS BY MOUTH DAILY AS NEEDED. 360 tablet 0   SIMBRINZA 1-0.2 % SUSP Place 1 drop into both eyes in the morning and at bedtime.  3   timolol (TIMOPTIC) 0.5 % ophthalmic solution Place 1 drop into both eyes 2 (two) times daily.  3   torsemide (DEMADEX) 20 MG tablet TAKE 1 TABLET BY MOUTH EVERY DAY 90 tablet 0   traMADol (ULTRAM) 50 MG tablet Take 1 tablet (50 mg total) by mouth every 6 (six) hours as needed. 90 tablet 3   Travoprost, BAK Free, (TRAVATAN) 0.004 % SOLN ophthalmic solution Place 1 drop into both eyes at bedtime.      triamcinolone cream (KENALOG) 0.1 % APPLY TO AFFECTED AREA TWICE A DAY 30 g 1   zolpidem  (AMBIEN) 5 MG tablet TAKE 1 TABLET(5 MG) BY MOUTH AT BEDTIME AS NEEDED FOR SLEEP 90 tablet 0   valsartan (DIOVAN) 80 MG tablet Take 1 tablet (80 mg total) by mouth daily. 30 tablet 6   No facility-administered medications prior to visit.    ROS Review of Systems  Constitutional:  Negative for chills, diaphoresis, fatigue and fever.  HENT: Negative.    Eyes: Negative.   Respiratory:  Negative for cough, shortness of breath and wheezing.   Cardiovascular:  Negative for chest pain, palpitations and leg swelling.  Gastrointestinal: Negative.  Negative for abdominal pain, constipation, diarrhea, nausea and vomiting.  Endocrine: Negative.   Genitourinary: Negative.  Negative for difficulty urinating.  Musculoskeletal: Negative.  Negative for myalgias.  Skin: Negative.  Negative for color change.  Neurological:  Negative for dizziness, weakness and light-headedness.  Hematological:  Negative for adenopathy. Does not bruise/bleed easily.  Psychiatric/Behavioral: Negative.     Objective:  BP (!) 152/84 (BP Location: Right Arm, Patient Position: Sitting, Cuff Size: Large)    Pulse (!) 54    Temp 97.8 F (36.6 C) (Oral)    Resp 16    Ht 5\' 11"  (1.803 m)    Wt 229 lb 4 oz (104 kg)    SpO2 97%    BMI 31.97 kg/m   BP Readings from Last 3 Encounters:  08/02/21 Marland Kitchen)  152/84  07/19/21 (!) 163/89  02/22/21 138/82    Wt Readings from Last 3 Encounters:  08/02/21 229 lb 4 oz (104 kg)  07/19/21 220 lb (99.8 kg)  02/22/21 228 lb 9.6 oz (103.7 kg)    Physical Exam Vitals reviewed.  HENT:     Mouth/Throat:     Mouth: Mucous membranes are moist.  Eyes:     General: No scleral icterus.    Conjunctiva/sclera: Conjunctivae normal.  Cardiovascular:     Rate and Rhythm: Bradycardia present. Occasional Extrasystoles are present.    Heart sounds: S1 normal and S2 normal. Murmur heard.  Diastolic murmur is present with a grade of 1/4.    No gallop.     Comments: EKG - SB with PAC's, 54 bpm LAD,  RBBB No Q waves or LVH Pulmonary:     Effort: Pulmonary effort is normal.     Breath sounds: No stridor. No wheezing, rhonchi or rales.  Abdominal:     General: Abdomen is protuberant. There is no distension.     Palpations: There is no hepatomegaly, splenomegaly or mass.     Tenderness: There is no abdominal tenderness. There is no guarding.     Hernia: No hernia is present.  Musculoskeletal:        General: No swelling.     Cervical back: Neck supple.     Right lower leg: No edema.     Left lower leg: No edema.  Lymphadenopathy:     Cervical: No cervical adenopathy.  Skin:    General: Skin is warm and dry.  Neurological:     General: No focal deficit present.     Mental Status: He is alert.  Psychiatric:        Mood and Affect: Mood normal.        Behavior: Behavior normal.    Lab Results  Component Value Date   WBC 3.6 (L) 07/19/2021   HGB 13.7 07/19/2021   HCT 42.0 07/19/2021   PLT 178 07/19/2021   GLUCOSE 107 (H) 07/19/2021   CHOL 161 02/22/2021   TRIG 92.0 02/22/2021   HDL 55.60 02/22/2021   LDLDIRECT 140.7 02/27/2013   LDLCALC 87 02/22/2021   ALT 10 02/22/2021   AST 13 02/22/2021   NA 140 07/19/2021   K 3.7 07/19/2021   CL 105 07/19/2021   CREATININE 1.96 (H) 07/19/2021   BUN 31 (H) 07/19/2021   CO2 27 07/19/2021   TSH 2.25 02/22/2021   PSA 2.39 02/22/2021    No results found.  Assessment & Plan:   John Hogan was seen today for hypertension and congestive heart failure.  Diagnoses and all orders for this visit:  Nonrheumatic aortic valve insufficiency- He sees cardiology soon about this.  Essential hypertension, benign- His blood pressure is not adequately well controlled.  Will restart the ARB and will add spironolactone. -     EKG 12-Lead -     valsartan (DIOVAN) 80 MG tablet; Take 1 tablet (80 mg total) by mouth daily. -     spironolactone (ALDACTONE) 25 MG tablet; Take 1 tablet (25 mg total) by mouth daily.  Chronic combined systolic and  diastolic heart failure ([place])- He has a normal volume status. -     valsartan (DIOVAN) 80 MG tablet; Take 1 tablet (80 mg total) by mouth daily. -     spironolactone (ALDACTONE) 25 MG tablet; Take 1 tablet (25 mg total) by mouth daily.   I am having John Hogan. start on spironolactone.  I am also having him maintain his Travoprost (BAK Free), timolol, Simbrinza, sildenafil, triamcinolone cream, Vitamin D3, colchicine, traMADol, zolpidem, allopurinol, pravastatin, torsemide, benzonatate, and valsartan.  Meds ordered this encounter  Medications   valsartan (DIOVAN) 80 MG tablet    Sig: Take 1 tablet (80 mg total) by mouth daily.    Dispense:  90 tablet    Refill:  0   spironolactone (ALDACTONE) 25 MG tablet    Sig: Take 1 tablet (25 mg total) by mouth daily.    Dispense:  90 tablet    Refill:  0     Follow-up: Return in about 3 months (around 10/30/2021).  Scarlette Calico, MD

## 2021-08-05 ENCOUNTER — Encounter: Payer: 59 | Admitting: Orthopaedic Surgery

## 2021-08-09 ENCOUNTER — Other Ambulatory Visit: Payer: Self-pay | Admitting: Internal Medicine

## 2021-08-09 DIAGNOSIS — F5104 Psychophysiologic insomnia: Secondary | ICD-10-CM

## 2021-08-16 ENCOUNTER — Telehealth: Payer: Self-pay | Admitting: Orthopaedic Surgery

## 2021-08-16 ENCOUNTER — Other Ambulatory Visit: Payer: Self-pay | Admitting: Physician Assistant

## 2021-08-16 NOTE — Telephone Encounter (Signed)
Pt called and submitted new set of FMLA forms and medical release form, Pt 1st surgery was cancelled and monies already paid to [employer] for last surgery date need to be used for new surgery date. Accepted 08/16/2021

## 2021-08-17 NOTE — Progress Notes (Addendum)
Anesthesia Review:  PCP: DR Eilleen Kempf 08/02/21- LOV  Cardiologist : Hochrein- 10/20/20- LOV  Chest x-ray : EKG :08/02/21 Echo : 10/22/19  Stress test: Cardiac Cath :  Activity level:  can do a flight of stairs without difficulty  Sleep Study/ CPAP : none  Fasting Blood Sugar :      / Checks Blood Sugar -- times a day:   Blood Thinner/ Instructions /Last Dose: ASA / Instructions/ Last Dose :   Positive for covid on 07/21/21- in epic.  PT on proep of 08/20/21 without any symptoms.   Bmp DONE 08/20/21 ROUTED TO dR c[facility]IS bLACKMAN.

## 2021-08-18 NOTE — Progress Notes (Addendum)
Coivd test on 08/24/21.   Come thru main entrance at [facility] long.  Have a seat in the lobby on the right as you come thru the door.  Call [phone] and let them know you are her for covid testing.     Your procedure is scheduled on:          08/27/21   Report to [facility] Main  Entrance   Report to admitting at   0930AM     Call this number if you have problems the morning of surgery [phone]    REMEMBER: NO  SOLID FOOD CANDY OR GUM  or mints AFTER MIDNIGHT the nite before surgery. Marland KitchenCLEAR LIQUIDS UNTIL   0915am  morning  of surgery.        .    . PLEASE FINISH ENSURE DRINK PER SURGEON ORDER  WHICH NEEDS TO BE COMPLETED AT    0915am   .  Morning of surgery.      CLEAR LIQUID DIET   Foods Allowed                                                                    Coffee and tea, regular and decaf         no milk, cream or creamer                    Fruit ices (not with fruit pulp)                                      Iced Popsicles                                    Carbonated beverages, regular and diet                                   White cranberry and white grape and apple juices  Sports drinks like Gatorade Lightly seasoned clear broth or consume(fat free) Sugar, ___________________________________________________________________      BRUSH YOUR TEETH MORNING OF SURGERY AND RINSE YOUR MOUTH OUT, NO CHEWING GUM CANDY OR MINTS.     Take these medicines the morning of surgery with A SIP OF WATER:  allopurinol, eye drops     DO NOT TAKE ANY DIABETIC MEDICATIONS DAY OF YOUR SURGERY                               You may not have any metal on your body including hair pins and              piercings  Do not wear jewelry, make-up, lotions, powders or perfumes, deodorant             Do not wear nail polish on your fingernails.  Do not shave  48 hours prior to surgery.              Men may shave face and neck.   Do not bring  valuables to the hospital. [place].  Contacts, dentures or bridgework may not be worn into surgery.  Leave suitcase in the car. After surgery it may be brought to your room.     Patients discharged the day of surgery will not be allowed to drive home. IF YOU ARE HAVING SURGERY AND GOING HOME THE SAME DAY, YOU MUST HAVE AN ADULT TO DRIVE YOU HOME AND BE WITH YOU FOR 24 HOURS. YOU MAY GO HOME BY TAXI OR UBER OR ORTHERWISE, BUT AN ADULT MUST ACCOMPANY YOU HOME AND STAY WITH YOU FOR 24 HOURS.  Name and phone number of your driver:  Special Instructions: N/A              Please read over the following fact sheets you were given: _____________________________________________________________________  [facility] - Preparing for Surgery Before surgery, you can play an important role.  Because skin is not sterile, your skin needs to be as free of germs as possible.  You can reduce the number of germs on your skin by washing with CHG (chlorahexidine gluconate) soap before surgery.  CHG is an antiseptic cleaner which kills germs and bonds with the skin to continue killing germs even after washing. Please DO NOT use if you have an allergy to CHG or antibacterial soaps.  If your skin becomes reddened/irritated stop using the CHG and inform your nurse when you arrive at Short Stay. Do not shave (including legs and underarms) for at least 48 hours prior to the first CHG shower.  You may shave your face/neck. Please follow these instructions carefully:  1.  Shower with CHG Soap the night before surgery and the  morning of Surgery.  2.  If you choose to wash your hair, wash your hair first as usual with your  normal  shampoo.  3.  After you shampoo, rinse your hair and body thoroughly to remove the  shampoo.                           4.  Use CHG as you would any other liquid soap.  You can apply chg directly  to the skin and wash                       Gently with a scrungie or clean  washcloth.  5.  Apply the CHG Soap to your body ONLY FROM THE NECK DOWN.   Do not use on face/ open                           Wound or open sores. Avoid contact with eyes, ears mouth and genitals (private parts).                       Wash face,  Genitals (private parts) with your normal soap.             6.  Wash thoroughly, paying special attention to the area where your surgery  will be performed.  7.  Thoroughly rinse your body with warm water from the neck down.  8.  DO NOT shower/wash with your normal soap after using and rinsing off  the CHG Soap.                9.  Pat yourself dry with a clean towel.            10.  Wear clean pajamas.            11.  Place clean sheets on your bed the night of your first shower and do not  sleep with pets. Day of Surgery : Do not apply any lotions/deodorants the morning of surgery.  Please wear clean clothes to the hospital/surgery center.  FAILURE TO FOLLOW THESE INSTRUCTIONS MAY RESULT IN THE CANCELLATION OF YOUR SURGERY PATIENT SIGNATURE_________________________________  NURSE SIGNATURE__________________________________  ________________________________________________________________________

## 2021-08-20 ENCOUNTER — Other Ambulatory Visit: Payer: Self-pay

## 2021-08-20 ENCOUNTER — Encounter (HOSPITAL_COMMUNITY)
Admission: RE | Admit: 2021-08-20 | Discharge: 2021-08-20 | Disposition: A | Discharge status: Home / Self Care | Payer: 59 | Source: Ambulatory Visit | Attending: Orthopaedic Surgery | Admitting: Orthopaedic Surgery

## 2021-08-20 ENCOUNTER — Encounter (HOSPITAL_COMMUNITY): Payer: Self-pay

## 2021-08-20 VITALS — BP 132/82 | HR 62 | Temp 97.6°F | Resp 16 | Ht 71.0 in | Wt 223.0 lb

## 2021-08-20 DIAGNOSIS — N1831 Chronic kidney disease, stage 3a: Secondary | ICD-10-CM

## 2021-08-20 DIAGNOSIS — Z01812 Encounter for preprocedural laboratory examination: Secondary | ICD-10-CM | POA: Insufficient documentation

## 2021-08-20 DIAGNOSIS — Z01818 Encounter for other preprocedural examination: Secondary | ICD-10-CM

## 2021-08-20 LAB — CBC
HCT: 42.6 % (ref 39.0–52.0)
Hemoglobin: 14.1 g/dL (ref 13.0–17.0)
MCH: 31.3 pg (ref 26.0–34.0)
MCHC: 33.1 g/dL (ref 30.0–36.0)
MCV: 94.7 fL (ref 80.0–100.0)
Platelets: 182 10*3/uL (ref 150–400)
RBC: 4.5 MIL/uL (ref 4.22–5.81)
RDW: 13.2 % (ref 11.5–15.5)
WBC: 3.2 10*3/uL — ABNORMAL LOW (ref 4.0–10.5)
nRBC: 0 % (ref 0.0–0.2)

## 2021-08-20 LAB — SURGICAL PCR SCREEN
MRSA, PCR: NEGATIVE
Staphylococcus aureus: NEGATIVE

## 2021-08-20 LAB — BASIC METABOLIC PANEL
Anion gap: 11 (ref 5–15)
BUN: 37 mg/dL — ABNORMAL HIGH (ref 8–23)
CO2: 24 mmol/L (ref 22–32)
Calcium: 9 mg/dL (ref 8.9–10.3)
Chloride: 102 mmol/L (ref 98–111)
Creatinine, Ser: 2.34 mg/dL — ABNORMAL HIGH (ref 0.61–1.24)
GFR, Estimated: 29 mL/min — ABNORMAL LOW (ref >60)
Glucose, Bld: 114 mg/dL — ABNORMAL HIGH (ref 70–99)
Potassium: 4.1 mmol/L (ref 3.5–5.1)
Sodium: 137 mmol/L (ref 135–145)

## 2021-08-26 DIAGNOSIS — M1611 Unilateral primary osteoarthritis, right hip: Secondary | ICD-10-CM

## 2021-08-26 NOTE — H&P (Signed)
TOTAL HIP ADMISSION H&P ? ?Patient is admitted for right total hip arthroplasty. ? ?Subjective: ? ?Chief Complaint: right hip pain ? ?HPI: John Hogan., 71 y.o. male, has a history of pain and functional disability in the right hip(s) due to arthritis and patient has failed non-surgical conservative treatments for greater than 12 weeks to include NSAID's and/or analgesics, corticosteriod injections, flexibility and strengthening excercises, use of assistive devices, weight reduction as appropriate, and activity modification.  Onset of symptoms was gradual starting 5 years ago with gradually worsening course since that time.The patient noted no past surgery on the right hip(s).  Patient currently rates pain in the right hip at 10 out of 10 with activity. Patient has night pain, worsening of pain with activity and weight bearing, trendelenberg gait, pain that interfers with activities of daily living, and pain with passive range of motion. Patient has evidence of subchondral cysts, subchondral sclerosis, and joint space narrowing by imaging studies. This condition presents safety issues increasing the risk of falls.  There is no current active infection. ? ?Patient Active Problem List  ? Diagnosis Date Noted  ? Unilateral primary osteoarthritis, right hip 08/26/2021  ? / 07/23/2021  ? Chronic pain of right hip 02/22/2021  ? Flu vaccine need 02/22/2021  ? Grade I diastolic dysfunction 50/93/2671  ? Bradycardia 10/06/2019  ? Vitamin D deficiency 09/06/2017  ? CKD (chronic kidney disease) stage 3, GFR 30-59 ml/min (HCC) 08/07/2014  ? Insomnia 02/14/2014  ? Erectile dysfunction 02/27/2013  ? Papular atopic dermatitis 05/16/2012  ? Gout 02/16/2012  ? Routine general medical examination at a health care facility 02/16/2012  ? Hyperlipidemia with target LDL less than 100 02/16/2012  ? Aortic regurgitation 02/16/2012  ? Colon polyps 02/16/2012  ? Essential hypertension, benign 03/07/2009  ? Hypertensive heart disease  without heart failure 03/07/2009  ? COMBINED HEART FAILURE, CHRONIC 03/07/2009  ? ?Past Medical History:  ?Diagnosis Date  ? AI (aortic insufficiency)   ? Arthritis   ? CHF (congestive heart failure) ([place])   ? Chronic kidney disease   ? Glaucoma   ? Gout   ? H/O exercise stress test   ? Myoview 01/03/12: No scar or ischemia, EF 53%  ? Hyperlipidemia   ? Hypertension   ? Hypertensive cardiomyopathy ([place])   ? EF previously 35%;  echo 12/2012 EF 55%, mild AI, ascending aorta mildly dilated, mild LAE  ?  ?Past Surgical History:  ?Procedure Laterality Date  ? COLONOSCOPY    ? CYST REMOVAL HAND    ? HERNIA REPAIR    ? Umbilical  ? MASS EXCISION Left 08/04/2020  ? Procedure: EXCISION MASS LEFT PALM;  Surgeon: Daryll Brod, MD;  Location: [place];  Service: Orthopedics;  Laterality: Left;  ? POLYPECTOMY    ? WISDOM TOOTH EXTRACTION    ? WRIST SURGERY    ? right  ?  ?No current facility-administered medications for this encounter.  ? ?Current Outpatient Medications  ?Medication Sig Dispense Refill Last Dose  ? allopurinol (ZYLOPRIM) 300 MG tablet Take 1 tablet (300 mg total) by mouth daily. 90 tablet 0   ? Cholecalciferol (VITAMIN D3) 50 MCG (2000 UT) capsule TAKE 1 CAPSULE BY MOUTH EVERY DAY 90 capsule 1   ? colchicine 0.6 MG tablet Take 1 tablet (0.6 mg total) by mouth daily. When having flare (Patient taking differently: Take 0.6 mg by mouth daily as needed. When having flare) 5 tablet 2   ? sildenafil (REVATIO) 20 MG tablet TAKE 4  TABLETS BY MOUTH DAILY AS NEEDED. 360 tablet 0   ? SIMBRINZA 1-0.2 % SUSP Place 1 drop into both eyes in the morning and at bedtime.  3   ? timolol (TIMOPTIC) 0.5 % ophthalmic solution Place 1 drop into both eyes 2 (two) times daily.  3   ? traMADol (ULTRAM) 50 MG tablet Take 1 tablet (50 mg total) by mouth every 6 (six) hours as needed. 90 tablet 3   ? Travoprost, BAK Free, (TRAVATAN) 0.004 % SOLN ophthalmic solution Place 1 drop into both eyes at bedtime.      ? benzonatate  (TESSALON) 200 MG capsule Take 1 capsule (200 mg total) by mouth 3 (three) times daily as needed. 60 capsule 0   ? pravastatin (PRAVACHOL) 20 MG tablet TAKE 1 TABLET BY MOUTH EVERY DAY 90 tablet 0   ? spironolactone (ALDACTONE) 25 MG tablet Take 1 tablet (25 mg total) by mouth daily. 90 tablet 0   ? torsemide (DEMADEX) 20 MG tablet TAKE 1 TABLET BY MOUTH EVERY DAY 90 tablet 0   ? triamcinolone cream (KENALOG) 0.1 % APPLY TO AFFECTED AREA TWICE A DAY 30 g 1 Not Taking  ? valsartan (DIOVAN) 80 MG tablet Take 1 tablet (80 mg total) by mouth daily. 90 tablet 0   ? zolpidem (AMBIEN) 5 MG tablet TAKE 1 TABLET(5 MG) BY MOUTH AT BEDTIME AS NEEDED FOR SLEEP 30 tablet 0   ? ?No Known Allergies  ?Social History  ? ?Tobacco Use  ? Smoking status: Former  ?  Packs/day: 1.00  ?  Years: 2.00  ?  Pack years: 2.00  ?  Types: Cigarettes  ?  Quit date: 01/10/1975  ?  Years since quitting: 46.6  ? Smokeless tobacco: Never  ? Tobacco comments:  ?  Only smoked two years  ?Substance Use Topics  ? Alcohol use: No  ?  ?Family History  ?Problem Relation Age of Onset  ? Hypertension Father   ? Colon cancer Father 54  ? Hyperlipidemia Father   ? Heart disease Father   ? Diabetes Father   ? Stroke Mother   ?     deceased  ? Colon cancer Mother 56  ? Arthritis Mother   ? Hyperlipidemia Mother   ? Hypertension Mother   ? Diabetes Mother   ? Colon polyps Sister   ? Colon polyps Brother   ? COPD Neg Hx   ? Alcohol abuse Neg Hx   ? Kidney disease Neg Hx   ? Esophageal cancer Neg Hx   ? Rectal cancer Neg Hx   ? Stomach cancer Neg Hx   ?  ? ?Review of Systems  ?Musculoskeletal:  Positive for gait problem.  ?All other systems reviewed and are negative. ? ?Objective: ? ?Physical Exam ?Vitals reviewed.  ?Constitutional:   ?   Appearance: Normal appearance.  ?HENT:  ?   Head: Normocephalic and atraumatic.  ?Eyes:  ?   Extraocular Movements: Extraocular movements intact.  ?   Pupils: Pupils are equal, round, and reactive to light.  ?Cardiovascular:  ?    Rate and Rhythm: Normal rate and regular rhythm.  ?   Pulses: Normal pulses.  ?Pulmonary:  ?   Effort: Pulmonary effort is normal.  ?   Breath sounds: Normal breath sounds.  ?Abdominal:  ?   Palpations: Abdomen is soft.  ?Musculoskeletal:  ?   Cervical back: Normal range of motion and neck supple.  ?   Right hip: Tenderness and bony tenderness present.  Decreased range of motion. Decreased strength.  ?Neurological:  ?   Mental Status: He is alert and oriented to person, place, and time.  ?Psychiatric:     ?   Behavior: Behavior normal.  ? ? ?Vital signs in last 24 hours: ?  ? ?Labs: ? ? ?Estimated body mass index is 31.1 kg/m? as calculated from the following: ?  Height as of 08/20/21: 5\' 11"  (1.803 m). ?  Weight as of 08/20/21: 101.2 kg. ? ? ?Imaging Review ?Plain radiographs demonstrate severe degenerative joint disease of the right hip(s). The bone quality appears to be good for age and reported activity level. ? ? ? ? ? ?Assessment/Plan: ? ?End stage arthritis, right hip(s) ? ?The patient history, physical examination, clinical judgement of the provider and imaging studies are consistent with end stage degenerative joint disease of the right hip(s) and total hip arthroplasty is deemed medically necessary. The treatment options including medical management, injection therapy, arthroscopy and arthroplasty were discussed at length. The risks and benefits of total hip arthroplasty were presented and reviewed. The risks due to aseptic loosening, infection, stiffness, dislocation/subluxation,  thromboembolic complications and other imponderables were discussed.  The patient acknowledged the explanation, agreed to proceed with the plan and consent was signed. Patient is being admitted for inpatient treatment for surgery, pain control, PT, OT, prophylactic antibiotics, VTE prophylaxis, progressive ambulation and ADL's and discharge planning.The patient is planning to be discharged home with home health services ? ? ? ?

## 2021-08-27 ENCOUNTER — Other Ambulatory Visit: Payer: Self-pay

## 2021-08-27 ENCOUNTER — Encounter (HOSPITAL_COMMUNITY)
Admission: RE | Disposition: A | Discharge status: Home Health | Payer: Self-pay | Source: Home / Self Care | Attending: Orthopaedic Surgery

## 2021-08-27 ENCOUNTER — Observation Stay (HOSPITAL_COMMUNITY): Payer: 59

## 2021-08-27 ENCOUNTER — Ambulatory Visit (HOSPITAL_BASED_OUTPATIENT_CLINIC_OR_DEPARTMENT_OTHER): Payer: 59 | Admitting: Certified Registered Nurse Anesthetist

## 2021-08-27 ENCOUNTER — Telehealth: Payer: Self-pay | Admitting: Orthopaedic Surgery

## 2021-08-27 ENCOUNTER — Ambulatory Visit (HOSPITAL_COMMUNITY): Payer: 59

## 2021-08-27 ENCOUNTER — Observation Stay (HOSPITAL_COMMUNITY)
Admission: RE | Admit: 2021-08-27 | Discharge: 2021-08-28 | Disposition: A | Discharge status: Home Health | Payer: 59 | Attending: Orthopaedic Surgery | Admitting: Orthopaedic Surgery

## 2021-08-27 ENCOUNTER — Encounter (HOSPITAL_COMMUNITY): Payer: Self-pay | Admitting: Orthopaedic Surgery

## 2021-08-27 ENCOUNTER — Ambulatory Visit (HOSPITAL_COMMUNITY): Payer: 59 | Admitting: Physician Assistant

## 2021-08-27 DIAGNOSIS — M1611 Unilateral primary osteoarthritis, right hip: Secondary | ICD-10-CM

## 2021-08-27 DIAGNOSIS — Z87891 Personal history of nicotine dependence: Secondary | ICD-10-CM | POA: Diagnosis not present

## 2021-08-27 DIAGNOSIS — N183 Chronic kidney disease, stage 3 unspecified: Secondary | ICD-10-CM | POA: Insufficient documentation

## 2021-08-27 DIAGNOSIS — N289 Disorder of kidney and ureter, unspecified: Secondary | ICD-10-CM | POA: Diagnosis not present

## 2021-08-27 DIAGNOSIS — E785 Hyperlipidemia, unspecified: Secondary | ICD-10-CM | POA: Diagnosis not present

## 2021-08-27 DIAGNOSIS — Z01812 Encounter for preprocedural laboratory examination: Secondary | ICD-10-CM

## 2021-08-27 DIAGNOSIS — I110 Hypertensive heart disease with heart failure: Secondary | ICD-10-CM | POA: Diagnosis not present

## 2021-08-27 DIAGNOSIS — I130 Hypertensive heart and chronic kidney disease with heart failure and stage 1 through stage 4 chronic kidney disease, or unspecified chronic kidney disease: Secondary | ICD-10-CM | POA: Insufficient documentation

## 2021-08-27 DIAGNOSIS — Z96649 Presence of unspecified artificial hip joint: Secondary | ICD-10-CM

## 2021-08-27 DIAGNOSIS — Z79899 Other long term (current) drug therapy: Secondary | ICD-10-CM | POA: Diagnosis not present

## 2021-08-27 DIAGNOSIS — I509 Heart failure, unspecified: Secondary | ICD-10-CM

## 2021-08-27 DIAGNOSIS — Z96641 Presence of right artificial hip joint: Secondary | ICD-10-CM

## 2021-08-27 DIAGNOSIS — I5042 Chronic combined systolic (congestive) and diastolic (congestive) heart failure: Secondary | ICD-10-CM | POA: Diagnosis not present

## 2021-08-27 HISTORY — PX: TOTAL HIP ARTHROPLASTY: SHX124

## 2021-08-27 LAB — TYPE AND SCREEN
ABO/RH(D): B NEG
Antibody Screen: NEGATIVE

## 2021-08-27 SURGERY — ARTHROPLASTY, HIP, TOTAL, ANTERIOR APPROACH
Anesthesia: General | Site: Hip | Laterality: Right

## 2021-08-27 MED ORDER — DEXAMETHASONE SODIUM PHOSPHATE 10 MG/ML IJ SOLN
INTRAMUSCULAR | Status: DC | PRN
  Administered 2021-08-27: 8 mg via INTRAVENOUS

## 2021-08-27 MED ORDER — OXYCODONE HCL 5 MG PO TABS
10.0000 mg | ORAL_TABLET | ORAL | Status: DC | PRN
  Administered 2021-08-27: 15 mg via ORAL
  Administered 2021-08-28: 10 mg via ORAL
  Filled 2021-08-27: qty 3

## 2021-08-27 MED ORDER — PROPOFOL 1000 MG/100ML IV EMUL
INTRAVENOUS | Status: AC
  Filled 2021-08-27: qty 100

## 2021-08-27 MED ORDER — MIDAZOLAM HCL 2 MG/2ML IJ SOLN
INTRAMUSCULAR | Status: AC
  Filled 2021-08-27: qty 2

## 2021-08-27 MED ORDER — ONDANSETRON HCL 4 MG/2ML IJ SOLN
INTRAMUSCULAR | Status: AC
  Filled 2021-08-27: qty 2

## 2021-08-27 MED ORDER — ONDANSETRON HCL 4 MG/2ML IJ SOLN: 4.0000 mg | Freq: Four times a day (QID) | INTRAMUSCULAR | Status: DC | PRN

## 2021-08-27 MED ORDER — ZOLPIDEM TARTRATE 5 MG PO TABS: 5.0000 mg | ORAL_TABLET | Freq: Every evening | ORAL | Status: DC | PRN

## 2021-08-27 MED ORDER — PROPOFOL 10 MG/ML IV BOLUS
INTRAVENOUS | Status: DC | PRN
  Administered 2021-08-27: 160 mg via INTRAVENOUS

## 2021-08-27 MED ORDER — TRANEXAMIC ACID-NACL 1000-0.7 MG/100ML-% IV SOLN
1000.0000 mg | INTRAVENOUS | Status: AC
  Administered 2021-08-27: 1000 mg via INTRAVENOUS
  Filled 2021-08-27: qty 100

## 2021-08-27 MED ORDER — CHLORHEXIDINE GLUCONATE 0.12 % MT SOLN: 15.0000 mL | Freq: Once | OROMUCOSAL | Status: DC

## 2021-08-27 MED ORDER — GLYCOPYRROLATE 0.2 MG/ML IJ SOLN
INTRAMUSCULAR | Status: DC | PRN
  Administered 2021-08-27: .2 mg via INTRAVENOUS

## 2021-08-27 MED ORDER — LIDOCAINE 2% (20 MG/ML) 5 ML SYRINGE
INTRAMUSCULAR | Status: DC | PRN
Start: 2021-08-27 — End: 2021-08-27
  Administered 2021-08-27: 40 mg via INTRAVENOUS

## 2021-08-27 MED ORDER — OXYCODONE HCL 5 MG PO TABS
5.0000 mg | ORAL_TABLET | ORAL | Status: DC | PRN
  Administered 2021-08-28 (×2): 10 mg via ORAL
  Filled 2021-08-27 (×3): qty 2

## 2021-08-27 MED ORDER — LACTATED RINGERS IV SOLN: INTRAVENOUS | Status: DC

## 2021-08-27 MED ORDER — ALUM & MAG HYDROXIDE-SIMETH 200-200-20 MG/5ML PO SUSP: 30.0000 mL | ORAL | Status: DC | PRN

## 2021-08-27 MED ORDER — DIPHENHYDRAMINE HCL 12.5 MG/5ML PO ELIX: 12.5000 mg | ORAL_SOLUTION | ORAL | Status: DC | PRN

## 2021-08-27 MED ORDER — METHOCARBAMOL 500 MG PO TABS
500.0000 mg | ORAL_TABLET | Freq: Four times a day (QID) | ORAL | Status: DC | PRN
Start: 2021-08-27 — End: 2021-08-28
  Administered 2021-08-27: 500 mg via ORAL
  Filled 2021-08-27: qty 1

## 2021-08-27 MED ORDER — ALLOPURINOL 300 MG PO TABS
300.0000 mg | ORAL_TABLET | Freq: Every day | ORAL | Status: DC
  Administered 2021-08-28: 300 mg via ORAL
  Filled 2021-08-27: qty 1

## 2021-08-27 MED ORDER — CEFAZOLIN SODIUM-DEXTROSE 1-4 GM/50ML-% IV SOLN
1.0000 g | Freq: Four times a day (QID) | INTRAVENOUS | Status: AC
  Administered 2021-08-27 – 2021-08-28 (×2): 1 g via INTRAVENOUS
  Filled 2021-08-27 (×2): qty 50

## 2021-08-27 MED ORDER — ACETAMINOPHEN 325 MG PO TABS: 325.0000 mg | ORAL_TABLET | Freq: Four times a day (QID) | ORAL | Status: DC | PRN

## 2021-08-27 MED ORDER — TIMOLOL MALEATE 0.5 % OP SOLN
1.0000 [drp] | Freq: Two times a day (BID) | OPHTHALMIC | Status: DC
  Administered 2021-08-27 – 2021-08-28 (×2): 1 [drp] via OPHTHALMIC
  Filled 2021-08-27: qty 5

## 2021-08-27 MED ORDER — HYDROMORPHONE HCL 1 MG/ML IJ SOLN: 0.5000 mg | INTRAMUSCULAR | Status: DC | PRN

## 2021-08-27 MED ORDER — CEFAZOLIN SODIUM-DEXTROSE 2-4 GM/100ML-% IV SOLN
2.0000 g | INTRAVENOUS | Status: AC
  Administered 2021-08-27: 2 g via INTRAVENOUS
  Filled 2021-08-27: qty 100

## 2021-08-27 MED ORDER — ONDANSETRON HCL 4 MG PO TABS: 4.0000 mg | ORAL_TABLET | Freq: Four times a day (QID) | ORAL | Status: DC | PRN

## 2021-08-27 MED ORDER — MENTHOL 3 MG MT LOZG: 1.0000 | LOZENGE | OROMUCOSAL | Status: DC | PRN

## 2021-08-27 MED ORDER — 0.9 % SODIUM CHLORIDE (POUR BTL) OPTIME
TOPICAL | Status: DC | PRN
Start: 2021-08-27 — End: 2021-08-27
  Administered 2021-08-27: 1000 mL

## 2021-08-27 MED ORDER — SODIUM CHLORIDE 0.9 % IR SOLN
Status: DC | PRN
  Administered 2021-08-27: 1000 mL

## 2021-08-27 MED ORDER — CHLORHEXIDINE GLUCONATE 0.12 % MT SOLN
15.0000 mL | Freq: Once | OROMUCOSAL | Status: AC
  Administered 2021-08-27: 15 mL via OROMUCOSAL

## 2021-08-27 MED ORDER — DOCUSATE SODIUM 100 MG PO CAPS
100.0000 mg | ORAL_CAPSULE | Freq: Two times a day (BID) | ORAL | Status: DC
  Administered 2021-08-27 – 2021-08-28 (×2): 100 mg via ORAL
  Filled 2021-08-27 (×2): qty 1

## 2021-08-27 MED ORDER — PANTOPRAZOLE SODIUM 40 MG PO TBEC
40.0000 mg | DELAYED_RELEASE_TABLET | Freq: Every day | ORAL | Status: DC
  Administered 2021-08-27 – 2021-08-28 (×2): 40 mg via ORAL
  Filled 2021-08-27 (×2): qty 1

## 2021-08-27 MED ORDER — ROCURONIUM BROMIDE 10 MG/ML (PF) SYRINGE
PREFILLED_SYRINGE | INTRAVENOUS | Status: DC | PRN
Start: 2021-08-27 — End: 2021-08-27
  Administered 2021-08-27: 60 mg via INTRAVENOUS

## 2021-08-27 MED ORDER — PRAVASTATIN SODIUM 20 MG PO TABS
20.0000 mg | ORAL_TABLET | Freq: Every day | ORAL | Status: DC
  Administered 2021-08-27 – 2021-08-28 (×2): 20 mg via ORAL
  Filled 2021-08-27 (×2): qty 1

## 2021-08-27 MED ORDER — OXYCODONE HCL 5 MG PO TABS
ORAL_TABLET | ORAL | Status: AC
  Administered 2021-08-27: 10 mg via ORAL
  Filled 2021-08-27: qty 2

## 2021-08-27 MED ORDER — PHENOL 1.4 % MT LIQD: 1.0000 | OROMUCOSAL | Status: DC | PRN

## 2021-08-27 MED ORDER — EPHEDRINE SULFATE-NACL 50-0.9 MG/10ML-% IV SOSY
PREFILLED_SYRINGE | INTRAVENOUS | Status: DC | PRN
  Administered 2021-08-27: 10 mg via INTRAVENOUS

## 2021-08-27 MED ORDER — OXYCODONE HCL 5 MG/5ML PO SOLN: 5.0000 mg | Freq: Once | ORAL | Status: DC | PRN

## 2021-08-27 MED ORDER — ATROPINE SULFATE 0.4 MG/ML IV SOLN
INTRAVENOUS | Status: AC
  Filled 2021-08-27: qty 1

## 2021-08-27 MED ORDER — OXYCODONE HCL 5 MG PO TABS: 5.0000 mg | ORAL_TABLET | Freq: Once | ORAL | Status: DC | PRN

## 2021-08-27 MED ORDER — TORSEMIDE 20 MG PO TABS
20.0000 mg | ORAL_TABLET | Freq: Every day | ORAL | Status: DC
  Administered 2021-08-27 – 2021-08-28 (×2): 20 mg via ORAL
  Filled 2021-08-27 (×2): qty 1

## 2021-08-27 MED ORDER — METHOCARBAMOL 1000 MG/10ML IJ SOLN
500.0000 mg | Freq: Four times a day (QID) | INTRAVENOUS | Status: DC | PRN
  Filled 2021-08-27: qty 5

## 2021-08-27 MED ORDER — LATANOPROST 0.005 % OP SOLN
1.0000 [drp] | Freq: Every day | OPHTHALMIC | Status: DC
  Administered 2021-08-27: 1 [drp] via OPHTHALMIC
  Filled 2021-08-27: qty 2.5

## 2021-08-27 MED ORDER — FENTANYL CITRATE (PF) 100 MCG/2ML IJ SOLN
INTRAMUSCULAR | Status: DC | PRN
Start: 2021-08-27 — End: 2021-08-27
  Administered 2021-08-27 (×2): 50 ug via INTRAVENOUS

## 2021-08-27 MED ORDER — VITAMIN D 25 MCG (1000 UNIT) PO TABS
2000.0000 [IU] | ORAL_TABLET | Freq: Every day | ORAL | Status: DC
  Administered 2021-08-28: 2000 [IU] via ORAL
  Filled 2021-08-27: qty 2

## 2021-08-27 MED ORDER — ORAL CARE MOUTH RINSE: 15.0000 mL | Freq: Once | OROMUCOSAL | Status: DC

## 2021-08-27 MED ORDER — SODIUM CHLORIDE 0.9 % IV SOLN: INTRAVENOUS | Status: DC

## 2021-08-27 MED ORDER — METOCLOPRAMIDE HCL 5 MG/ML IJ SOLN: 5.0000 mg | Freq: Three times a day (TID) | INTRAMUSCULAR | Status: DC | PRN

## 2021-08-27 MED ORDER — SUGAMMADEX SODIUM 200 MG/2ML IV SOLN
INTRAVENOUS | Status: DC | PRN
  Administered 2021-08-27: 200 mg via INTRAVENOUS

## 2021-08-27 MED ORDER — FENTANYL CITRATE PF 50 MCG/ML IJ SOSY: 25.0000 ug | PREFILLED_SYRINGE | INTRAMUSCULAR | Status: DC | PRN

## 2021-08-27 MED ORDER — POVIDONE-IODINE 10 % EX SWAB
2.0000 "application " | Freq: Once | CUTANEOUS | Status: AC
  Administered 2021-08-27: 2 via TOPICAL

## 2021-08-27 MED ORDER — SPIRONOLACTONE 25 MG PO TABS
25.0000 mg | ORAL_TABLET | Freq: Every day | ORAL | Status: DC
  Administered 2021-08-27 – 2021-08-28 (×2): 25 mg via ORAL
  Filled 2021-08-27 (×2): qty 1

## 2021-08-27 MED ORDER — ASPIRIN 81 MG PO CHEW
81.0000 mg | CHEWABLE_TABLET | Freq: Two times a day (BID) | ORAL | Status: DC
  Administered 2021-08-27 – 2021-08-28 (×2): 81 mg via ORAL
  Filled 2021-08-27 (×2): qty 1

## 2021-08-27 MED ORDER — FENTANYL CITRATE (PF) 100 MCG/2ML IJ SOLN
INTRAMUSCULAR | Status: AC
  Filled 2021-08-27: qty 2

## 2021-08-27 MED ORDER — ONDANSETRON HCL 4 MG/2ML IJ SOLN
INTRAMUSCULAR | Status: DC | PRN
  Administered 2021-08-27: 4 mg via INTRAVENOUS

## 2021-08-27 MED ORDER — IRBESARTAN 75 MG PO TABS
75.0000 mg | ORAL_TABLET | Freq: Every day | ORAL | Status: DC
  Administered 2021-08-27 – 2021-08-28 (×2): 75 mg via ORAL
  Filled 2021-08-27 (×2): qty 1

## 2021-08-27 MED ORDER — EPHEDRINE 5 MG/ML INJ
INTRAVENOUS | Status: AC
  Filled 2021-08-27: qty 5

## 2021-08-27 MED ORDER — METOCLOPRAMIDE HCL 5 MG PO TABS: 5.0000 mg | ORAL_TABLET | Freq: Three times a day (TID) | ORAL | Status: DC | PRN

## 2021-08-27 MED ORDER — ORAL CARE MOUTH RINSE: 15.0000 mL | Freq: Once | OROMUCOSAL | Status: AC

## 2021-08-27 MED ORDER — MIDAZOLAM HCL 5 MG/5ML IJ SOLN
INTRAMUSCULAR | Status: DC | PRN
  Administered 2021-08-27: 2 mg via INTRAVENOUS

## 2021-08-27 MED ORDER — STERILE WATER FOR IRRIGATION IR SOLN
Status: DC | PRN
  Administered 2021-08-27: 2000 mL

## 2021-08-27 SURGICAL SUPPLY — 47 items
BAG COUNTER SPONGE SURGICOUNT (BAG) ×1 IMPLANT
BAG ZIPLOCK 12X15 (MISCELLANEOUS) IMPLANT
BALL HIP ARTICU EZE 36 8.5 (Hips) IMPLANT
BENZOIN TINCTURE PRP APPL 2/3 (GAUZE/BANDAGES/DRESSINGS) IMPLANT
BLADE SAW SGTL 18X1.27X75 (BLADE) ×2 IMPLANT
COVER PERINEAL POST (MISCELLANEOUS) ×2 IMPLANT
COVER SURGICAL LIGHT HANDLE (MISCELLANEOUS) ×2 IMPLANT
CUP SECTOR GRIPTON 58MM (Orthopedic Implant) ×1 IMPLANT
DRAPE FOOT SWITCH (DRAPES) ×2 IMPLANT
DRAPE STERI IOBAN 125X83 (DRAPES) ×2 IMPLANT
DRAPE U-SHAPE 47X51 STRL (DRAPES) ×4 IMPLANT
DRSG AQUACEL AG ADV 3.5X10 (GAUZE/BANDAGES/DRESSINGS) ×2 IMPLANT
DURAPREP 26ML APPLICATOR (WOUND CARE) ×2 IMPLANT
ELECT REM PT RETURN 15FT ADLT (MISCELLANEOUS) ×2 IMPLANT
GAUZE XEROFORM 1X8 LF (GAUZE/BANDAGES/DRESSINGS) ×1 IMPLANT
GLOVE SRG 8 PF TXTR STRL LF DI (GLOVE) ×2 IMPLANT
GLOVE SURG ENC MOIS LTX SZ7.5 (GLOVE) ×2 IMPLANT
GLOVE SURG NEOPR MICRO LF SZ8 (GLOVE) ×2 IMPLANT
GLOVE SURG POLYISO LF SZ7 (GLOVE) ×3 IMPLANT
GLOVE SURG UNDER POLY LF SZ7 (GLOVE) ×1 IMPLANT
GLOVE SURG UNDER POLY LF SZ8 (GLOVE) ×4
GOWN STRL REIN XL XLG (GOWN DISPOSABLE) ×1 IMPLANT
GOWN STRL REUS W/TWL LRG LVL3 (GOWN DISPOSABLE) ×2 IMPLANT
GOWN STRL REUS W/TWL XL LVL3 (GOWN DISPOSABLE) ×2 IMPLANT
HANDPIECE INTERPULSE COAX TIP (DISPOSABLE) ×2
HIP BALL ARTICU EZE 36 8.5 (Hips) ×2 IMPLANT
HOLDER FOLEY CATH W/STRAP (MISCELLANEOUS) ×2 IMPLANT
IV NS IRRIG 3000ML ARTHROMATIC (IV SOLUTION) ×1 IMPLANT
KIT TURNOVER KIT A (KITS) IMPLANT
LINER NEUTRAL 52X36X58N (Liner) ×1 IMPLANT
NS IRRIG 1000ML POUR BTL (IV SOLUTION) ×1 IMPLANT
PACK ANTERIOR HIP CUSTOM (KITS) ×2 IMPLANT
SET HNDPC FAN SPRY TIP SCT (DISPOSABLE) ×1 IMPLANT
SPONGE T-LAP 18X18 ~~LOC~~+RFID (SPONGE) ×6 IMPLANT
STAPLER VISISTAT 35W (STAPLE) IMPLANT
STEM FEM ACTIS STD SZ4 (Stem) ×1 IMPLANT
STRIP CLOSURE SKIN 1/2X4 (GAUZE/BANDAGES/DRESSINGS) IMPLANT
SUT ETHIBOND NAB CT1 #1 30IN (SUTURE) ×2 IMPLANT
SUT ETHILON 2 0 PS N (SUTURE) IMPLANT
SUT MNCRL AB 4-0 PS2 18 (SUTURE) IMPLANT
SUT VIC AB 0 CT1 36 (SUTURE) ×2 IMPLANT
SUT VIC AB 1 CT1 36 (SUTURE) ×2 IMPLANT
SUT VIC AB 2-0 CT1 27 (SUTURE) ×4
SUT VIC AB 2-0 CT1 TAPERPNT 27 (SUTURE) ×2 IMPLANT
TRAY FOLEY MTR SLVR 16FR STAT (SET/KITS/TRAYS/PACK) IMPLANT
WATER STERILE IRR 1000ML POUR (IV SOLUTION) ×2 IMPLANT
YANKAUER SUCT BULB TIP NO VENT (SUCTIONS) ×2 IMPLANT

## 2021-08-27 NOTE — Interval H&P Note (Signed)
History and Physical Interval Note: The patient understands that he is here today for a right total hip replacement to treat his right hip osteoarthritis.  There has been no acute or interval changes in medical status.  Please see H&P.  The risks and benefits of surgery been explained in detail and informed consent is obtained. ? ?08/27/2021 ?12:27 PM ? ?John Hogan.  has presented today for surgery, with the diagnosis of Right hip osteoarthritis, degenerative joint diease.  The various methods of treatment have been discussed with the patient and family. After consideration of risks, benefits and other options for treatment, the patient has consented to  Procedure(s): ?Right HIP ARTHROPLASTY ANTERIOR APPROACH (Right) as a surgical intervention.  The patient's history has been reviewed, patient examined, no change in status, stable for surgery.  I have reviewed the patient's chart and labs.  Questions were answered to the patient's satisfaction.   ? ? ?John Hogan ? ? ?

## 2021-08-27 NOTE — Telephone Encounter (Signed)
Pt's care taker for his surgery had pt's sign medical release form, her FMLA forms for her employer, and $25.00 cash payment to [employer]. Accepted 08/27/21 ?

## 2021-08-27 NOTE — Anesthesia Procedure Notes (Signed)
Procedure Name: Intubation ?Date/Time: 08/27/2021 1:53 PM ?Performed by: Niel Hummer, CRNA ?Pre-anesthesia Checklist: Patient identified, Emergency Drugs available, Suction available and Patient being monitored ?Patient Re-evaluated:Patient Re-evaluated prior to induction ?Oxygen Delivery Method: Circle system utilized ?Preoxygenation: Pre-oxygenation with 100% oxygen ?Induction Type: IV induction ?Ventilation: Mask ventilation without difficulty ?Laryngoscope Size: Mac and 4 ?Grade View: Grade I ?Tube type: Oral ?Tube size: 7.5 mm ?Number of attempts: 1 ?Airway Equipment and Method: Stylet ?Placement Confirmation: ETT inserted through vocal cords under direct vision, positive ETCO2 and breath sounds checked- equal and bilateral ?Secured at: 24 cm ?Tube secured with: Tape ?Dental Injury: Teeth and Oropharynx as per pre-operative assessment  ? ? ? ? ?

## 2021-08-27 NOTE — Evaluation (Signed)
Physical Therapy Evaluation ?Patient Details ?Name: John Hogan. ?MRN: [id number] ?DOB: [birth year] ?Today's Date: 08/27/2021 ? ?History of Present Illness ? Pt s/p R THR and with hx of CKD, CHF and hytertensive cardiomyopathy  ?Clinical Impression ? Pt s/p R THR and presents with decreased R LE strength/ROM and post op pain limiting functional mobility.  Pt should progress to dc home with family assist. ?   ? ?Recommendations for follow up therapy are one component of a multi-disciplinary discharge planning process, led by the attending physician.  Recommendations may be updated based on patient status, additional functional criteria and insurance authorization. ? ?Follow Up Recommendations Follow physician's recommendations for discharge plan and follow up therapies ? ?  ?Assistance Recommended at Discharge Intermittent Supervision/Assistance  ?Patient can return home with the following ? A little help with walking and/or transfers;A little help with bathing/dressing/bathroom;Assistance with cooking/housework;Help with stairs or ramp for entrance;Assist for transportation ? ?  ?Equipment Recommendations Rolling walker (2 wheels);BSC/3in1  ?Recommendations for Other Services ?    ?  ?Functional Status Assessment Patient has had a recent decline in their functional status and demonstrates the ability to make significant improvements in function in a reasonable and predictable amount of time.  ? ?  ?Precautions / Restrictions Precautions ?Precautions: Fall ?Restrictions ?Weight Bearing Restrictions: No ?Other Position/Activity Restrictions: WBAT  ? ?  ? ?Mobility ? Bed Mobility ?Overal bed mobility: Needs Assistance ?Bed Mobility: Supine to Sit ?  ?  ?Supine to sit: Min assist ?  ?  ?General bed mobility comments: increased time with extensive use of bedrails and min assist to manage R LE ?  ? ?Transfers ?Overall transfer level: Needs assistance ?Equipment used: Rolling walker (2 wheels) ?Transfers: Sit to/from  Stand ?Sit to Stand: Min assist, From elevated surface ?  ?  ?  ?  ?  ?General transfer comment: cues for LE management and use of UEs to self assist ?  ? ?Ambulation/Gait ?Ambulation/Gait assistance: Min assist ?Gait Distance (Feet): 62 Feet ?Assistive device: Rolling walker (2 wheels) ?Gait Pattern/deviations: Step-to pattern, Step-through pattern, Decreased step length - right, Decreased step length - left, Trunk flexed ?Gait velocity: decr ?  ?  ?General Gait Details: cues for posture, position from RW and initial seqeunce ? ?Stairs ?  ?  ?  ?  ?  ? ?Wheelchair Mobility ?  ? ?Modified Rankin (Stroke Patients Only) ?  ? ?  ? ?Balance Overall balance assessment: Needs assistance ?Sitting-balance support: No upper extremity supported, Feet supported ?Sitting balance-Leahy Scale: Good ?  ?  ?Standing balance support: Single extremity supported ?Standing balance-Leahy Scale: Poor ?  ?  ?  ?  ?  ?  ?  ?  ?  ?  ?  ?  ?   ? ? ? ?Pertinent Vitals/Pain Pain Assessment ?Pain Assessment: 0-10 ?Pain Score: 5  ?Pain Location: R hip ?Pain Descriptors / Indicators: Aching, Sore ?Pain Intervention(s): Limited activity within patient's tolerance, Monitored during session, Premedicated before session, Ice applied  ? ? ?Home Living Family/patient expects to be discharged to:: Private residence ?Living Arrangements: Spouse/significant other ?Available Help at Discharge: Family ?Type of Home: House ?Home Access: Stairs to enter ?Entrance Stairs-Rails: Right ?Entrance Stairs-Number of Steps: 3 ?  ?Home Layout: One level ?Home Equipment: None ?   ?  ?Prior Function Prior Level of Function : Independent/Modified Independent ?  ?  ?  ?  ?  ?  ?  ?  ?  ? ? ?Hand Dominance  ?   ? ?  ?  Extremity/Trunk Assessment  ? Upper Extremity Assessment ?Upper Extremity Assessment: Overall WFL for tasks assessed ?  ? ?Lower Extremity Assessment ?Lower Extremity Assessment: RLE deficits/detail ?  ? ?Cervical / Trunk Assessment ?Cervical / Trunk  Assessment: Normal  ?Communication  ? Communication: No difficulties  ?Cognition Arousal/Alertness: Awake/alert ?Behavior During Therapy: [facility] for tasks assessed/performed ?Overall Cognitive Status: Within Functional Limits for tasks assessed ?  ?  ?  ?  ?  ?  ?  ?  ?  ?  ?  ?  ?  ?  ?  ?  ?  ?  ?  ? ?  ?General Comments   ? ?  ?Exercises Total Joint Exercises ?Ankle Circles/Pumps: AROM, Both, 15 reps, Supine  ? ?Assessment/Plan  ?  ?PT Assessment Patient needs continued PT services  ?PT Problem List Decreased strength;Decreased range of motion;Decreased activity tolerance;Decreased balance;Decreased mobility;Decreased knowledge of use of DME;Pain ? ?   ?  ?PT Treatment Interventions DME instruction;Gait training;Stair training;Functional mobility training;Therapeutic activities;Therapeutic exercise;Patient/family education;Balance training   ? ?PT Goals (Current goals can be found in the Care Plan section)  ?Acute Rehab PT Goals ?Patient Stated Goal: Regain IND ?PT Goal Formulation: With patient ?Time For Goal Achievement: 09/03/21 ?Potential to Achieve Goals: Good ? ?  ?Frequency 7X/week ?  ? ? ?Co-evaluation   ?  ?  ?  ?  ? ? ?  ?AM-PAC PT "6 Clicks" Mobility  ?Outcome Measure Help needed turning from your back to your side while in a flat bed without using bedrails?: A Little ?Help needed moving from lying on your back to sitting on the side of a flat bed without using bedrails?: A Little ?Help needed moving to and from a bed to a chair (including a wheelchair)?: A Little ?Help needed standing up from a chair using your arms (e.g., wheelchair or bedside chair)?: A Little ?Help needed to walk in hospital room?: A Little ?Help needed climbing 3-5 steps with a railing? : A Lot ?6 Click Score: 17 ? ?  ?End of Session Equipment Utilized During Treatment: Gait belt ?Activity Tolerance: Patient tolerated treatment well ?Patient left: in chair;with call bell/phone within reach;with family/visitor present ?Nurse  Communication: Mobility status ?PT Visit Diagnosis: Difficulty in walking, not elsewhere classified (R26.2) ?  ? ?Time: 0350-0938 ?PT Time Calculation (min) (ACUTE ONLY): 27 min ? ? ?Charges:   PT Evaluation ?$PT Eval Low Complexity: 1 Low ?PT Treatments ?$Gait Training: 8-22 mins ?  ?   ? ? ?Debe Coder PT ?Acute Rehabilitation Services ?Pager [phone] ?Office [phone] ? ? ?John Hogan ?08/27/2021, 7:25 PM ? ?

## 2021-08-27 NOTE — Plan of Care (Signed)
  Problem: Education: Goal: Knowledge of General Education information will improve Description: Including pain rating scale, medication(s)/side effects and non-pharmacologic comfort measures Outcome: Progressing   Problem: Activity: Goal: Risk for activity intolerance will decrease Outcome: Progressing   Problem: Pain Managment: Goal: General experience of comfort will improve Outcome: Progressing   

## 2021-08-27 NOTE — Brief Op Note (Signed)
08/27/2021 ? ?2:58 PM ? ?PATIENT:  John Hogan.  71 y.o. male ? ?PRE-OPERATIVE DIAGNOSIS:  Right hip osteoarthritis, degenerative joint diease ? ?POST-OPERATIVE DIAGNOSIS:  Right hip osteoarthritis, degenerative joint diease ? ?PROCEDURE:  Procedure(s): ?Right HIP ARTHROPLASTY ANTERIOR APPROACH (Right) ? ?SURGEON:  Surgeon(s) and Role: ?   Mcarthur Rossetti, MD - Primary ? ?PHYSICIAN ASSISTANT:  Benita Stabile, PA-C ? ?ANESTHESIA:   general ? ?EBL:  150 mL  ? ?COUNTS:  YES ? ?DICTATION: .Other Dictation: Dictation Number [id number] ? ?PLAN OF CARE: Admit for overnight observation ? ?PATIENT DISPOSITION:  PACU - hemodynamically stable. ?  ?Delay start of Pharmacological VTE agent (>24hrs) due to surgical blood loss or risk of bleeding: no ? ?

## 2021-08-27 NOTE — Op Note (Signed)
NAME: John Hogan, John Hogan ?MEDICAL RECORD NO: [id number] ?ACCOUNT NO: [id number] ?DATE OF BIRTH: [birth year] ?FACILITY: [facility] ?LOCATION: [facility] ?PHYSICIAN: Lind Guest. Ninfa Linden, MD ? ?Operative Report  ? ?DATE OF PROCEDURE: 08/27/2021 ? ? ?PREOPERATIVE DIAGNOSIS:  Primary osteoarthritis and degenerative joint disease, right hip. ? ?POSTOPERATIVE DIAGNOSIS:  Primary osteoarthritis and degenerative joint disease, right hip. ? ?PROCEDURE:  Right total hip arthroplasty through direct anterior approach. ? ?IMPLANTS:  DePuy sector Gription acetabular component, size 58, size 36+0 neutral polyethylene liner, size 4 ACTIS femoral component with standard offset, size 36+8.5 metal hip ball. ? ?SURGEON:  Lind Guest. Ninfa Linden, MD ? ?ASSISTANT:  Erskine Emery, PA-C ? ?ANESTHESIA:  General. ? ?ANTIBIOTICS:  2 g IV Ancef. ? ?BLOOD LOSS:  150 mL ? ?COMPLICATIONS:  None. ? ?INDICATIONS:  The patient is a 71 year old gentleman with well documented debilitating arthritis involving his right hip.  His x-rays confirm end-stage arthritis and his clinical exam shows painful hip with limited rotation.  His right hip pain is daily  ?and it is detrimentally affecting his mobility, his quality of life and his activities of daily living to the point he is requesting for hip replacement.  We agree with this as well.  I did talk to him in detail about the risk of acute blood loss anemia, ? nerve or vessel injury, fracture, infection, dislocation, DVT, implant failure, leg length differences and skin and soft tissue issues.  We talked about our goals being decreased pain, improve mobility and overall improve quality of life. ? ?DESCRIPTION OF PROCEDURE:  After informed consent was obtained, appropriate right hip was marked, he was brought to the operating room.  General anesthesia was obtained while he is on stretcher, traction boots were placed on both his feet.  Next, he was  ?placed supine on the Hana fracture table, the perineal post in place and  both legs in line skeletal traction device and no traction applied.  His right operative hip was prepped and draped with DuraPrep and sterile drapes.  A timeout was called and he  ?was identified as correct patient, correct right hip.  I then made an incision just inferior and posterior to the anterior superior iliac spine and carried this obliquely down the leg.  We dissected down tensor fascia lata muscle.  Tensor fascia was then ? divided longitudinally to proceed with direct anterior approach to the hip.  We identified and cauterized circumflex vessels and identified the hip capsule.  I opened up the hip capsule in L-type format finding a moderate joint effusion and significant  ?periarticular osteophytes around the lateral femoral head and neck.  We placed Cobra retractors around the medial and lateral femoral neck and made a femoral neck cut with an oscillating saw, proximal to the lesser trochanter and completed this with an  ?osteotome.  I placed corkscrew guide in the femoral head and removed the femoral head in its entirety and found a wide area devoid of cartilage.  I then placed a bent Hohmann over the medial acetabular rim and removed remnants of acetabular labrum and  ?other debris.  I then began reaming in a stepwise increments from a small reamer going all the way up to a size 57 reamer with all reamers placed under direct visualization.  Last reamers were placed under direct fluoroscopy, so we could obtain our depth ? of reaming, our inclination and anteversion.  I then placed real DePuy sector Gription acetabular component, size 58 and we went with a 36+0 polyethylene liner.  Attention was  then turned to the femur.  With the leg externally rotated to 120 degrees  ?extended and adducted, we were able to place a Mueller retractor medially and Hohmann retractor behind the greater trochanter.  I released the lateral joint capsule and used a box cutting osteotome to enter the femoral canal and a rongeur  to lateralize,  ?we then began broaching using the ACTIS broaching system from a size 0 to a size 4.  With the size 4 in place, we trialed a standard offset femoral neck and a 36+15 hip ball and reduced this in the acetabulum.  Based on radiographic assessment, we  ?definitely needed more leg length.  We dislocated the hip, removed the trial components.  We went with a real ACTIS femoral component, size 4 and put this down in the femoral canal easily.  We then went with a 36+8.5 metal hip ball and again reduced this ? in acetabulum and with this we were pleased with leg length, range of motion, offset and stability assessed mechanically and radiographically.  We then irrigated the soft tissue with normal saline solution.  We closed the joint capsule with interrupted  ?#1 Ethibond suture followed by #1 Vicryl to close the tensor fascia.  0 Vicryl was used to close deep tissue and 2-0 Vicryl was used to close the subcutaneous tissue.  The skin was closed with staples.  Aquacel dressing was applied.  He was awakened,  ?extubated, and taken to recovery room in stable condition with all final counts being correct.  There were no complications noted.  Of note, Benita Stabile, PA-C, assisted during the entirety of the case and his assistance was crucial from opening to closing, ? with retraction of soft tissues, mobilization of soft tissues, layered closure of the wound and assessment of positioning.  His assistance was medically necessary.  ? ? ? ?[place] ?D: 08/27/2021 2:56:45 pm T: 08/27/2021 11:05:00 pm  ?JOB: [id number]/ [id number]  ?

## 2021-08-27 NOTE — Anesthesia Postprocedure Evaluation (Signed)
Anesthesia Post Note ? ?Patient: John Hogan. ? ?Procedure(s) Performed: Right HIP ARTHROPLASTY ANTERIOR APPROACH (Right: Hip) ? ?  ? ?Patient location during evaluation: PACU ?Anesthesia Type: General ?Level of consciousness: awake and alert ?Pain management: pain level controlled ?Vital Signs Assessment: post-procedure vital signs reviewed and stable ?Respiratory status: spontaneous breathing, nonlabored ventilation, respiratory function stable and patient connected to nasal cannula oxygen ?Cardiovascular status: blood pressure returned to baseline and stable ?Postop Assessment: no apparent nausea or vomiting ?Anesthetic complications: no ? ? ?No notable events documented. ? ?Last Vitals:  ?Vitals:  ? 08/27/21 1141 08/27/21 1530  ?BP: 137/73 (!) 168/82  ?Pulse: (!) 47 63  ?Resp: 18 20  ?Temp: (!) 36.3 ?C   ?SpO2: 100% 98%  ?  ?Last Pain:  ?Vitals:  ? 08/27/21 1530  ?TempSrc:   ?PainSc: 0-No pain  ? ? ?  ?  ?  ?  ?  ?  ? ?[place] ? ? ? ? ?

## 2021-08-27 NOTE — Anesthesia Preprocedure Evaluation (Addendum)
Anesthesia Evaluation  ?Patient identified by MRN, date of birth, ID band ?Patient awake ? ? ? ?Reviewed: ?Allergy & Precautions, H&P , NPO status , Patient's Chart, lab work & pertinent test results ? ?Airway ?Mallampati: II ? ? ?Neck ROM: full ? ? ? Dental ?  ?Pulmonary ?former smoker,  ?  ?breath sounds clear to auscultation ? ? ? ? ? ? Cardiovascular ?hypertension, +CHF  ? ?Rhythm:regular Rate:Normal ? ? ?  ?Neuro/Psych ?  ? GI/Hepatic ?  ?Endo/Other  ? ? Renal/GU ?Renal InsufficiencyRenal disease  ? ?  ?Musculoskeletal ? ?(+) Arthritis ,  ? Abdominal ?  ?Peds ? Hematology ?  ?Anesthesia Other Findings ? ? Reproductive/Obstetrics ? ?  ? ? ? ? ? ? ? ? ? ? ? ? ? ?  ?  ? ? ? ? ? ? ? ? ?Anesthesia Physical ?Anesthesia Plan ? ?ASA: 3 ? ?Anesthesia Plan: General  ? ?Post-op Pain Management:   ? ?Induction: Intravenous ? ?PONV Risk Score and Plan: 1 and Ondansetron, Treatment may vary due to age or medical condition and Dexamethasone ? ?Airway Management Planned: Oral ETT ? ?Additional Equipment:  ? ?Intra-op Plan:  ? ?Post-operative Plan: Extubation in OR ? ?Informed Consent: I have reviewed the patients History and Physical, chart, labs and discussed the procedure including the risks, benefits and alternatives for the proposed anesthesia with the patient or authorized representative who has indicated his/her understanding and acceptance.  ? ? ? ?Dental advisory given ? ?Plan Discussed with: Anesthesiologist, CRNA and Surgeon ? ?Anesthesia Plan Comments:   ? ? ? ? ? ?Anesthesia Quick Evaluation ? ?

## 2021-08-27 NOTE — Transfer of Care (Signed)
Immediate Anesthesia Transfer of Care Note ? ?Patient: John Hogan. ? ?Procedure(s) Performed: Right HIP ARTHROPLASTY ANTERIOR APPROACH (Right: Hip) ? ?Patient Location: PACU ? ?Anesthesia Type:General ? ?Level of Consciousness: awake, alert  and oriented ? ?Airway & Oxygen Therapy: Patient Spontanous Breathing and Patient connected to face mask oxygen ? ?Post-op Assessment: Report given to RN, Post -op Vital signs reviewed and stable and Patient moving all extremities X 4 ? ?Post vital signs: Reviewed and stable ? ?Last Vitals:  ?Vitals Value Taken Time  ?BP 150/85   ?Temp    ?Pulse 58   ?Resp    ?SpO2 100   ? ? ?Last Pain:  ?Vitals:  ? 08/27/21 1220  ?TempSrc:   ?PainSc: 0-No pain  ?   ? ?Patients Stated Pain Goal: 3 (08/27/21 1220) ? ?Complications: No notable events documented. ?

## 2021-08-28 DIAGNOSIS — M1611 Unilateral primary osteoarthritis, right hip: Secondary | ICD-10-CM | POA: Diagnosis not present

## 2021-08-28 LAB — CBC
HCT: 41.2 % (ref 39.0–52.0)
Hemoglobin: 13.9 g/dL (ref 13.0–17.0)
MCH: 31.3 pg (ref 26.0–34.0)
MCHC: 33.7 g/dL (ref 30.0–36.0)
MCV: 92.8 fL (ref 80.0–100.0)
Platelets: 184 10*3/uL (ref 150–400)
RBC: 4.44 MIL/uL (ref 4.22–5.81)
RDW: 13.1 % (ref 11.5–15.5)
WBC: 8 10*3/uL (ref 4.0–10.5)
nRBC: 0 % (ref 0.0–0.2)

## 2021-08-28 LAB — BASIC METABOLIC PANEL
Anion gap: 9 (ref 5–15)
BUN: 28 mg/dL — ABNORMAL HIGH (ref 8–23)
CO2: 23 mmol/L (ref 22–32)
Calcium: 8.7 mg/dL — ABNORMAL LOW (ref 8.9–10.3)
Chloride: 102 mmol/L (ref 98–111)
Creatinine, Ser: 2.07 mg/dL — ABNORMAL HIGH (ref 0.61–1.24)
GFR, Estimated: 34 mL/min — ABNORMAL LOW (ref >60)
Glucose, Bld: 165 mg/dL — ABNORMAL HIGH (ref 70–99)
Potassium: 3.8 mmol/L (ref 3.5–5.1)
Sodium: 134 mmol/L — ABNORMAL LOW (ref 135–145)

## 2021-08-28 MED ORDER — METHOCARBAMOL 500 MG PO TABS: 500.0000 mg | ORAL_TABLET | Freq: Four times a day (QID) | ORAL | 1 refills | Status: DC | PRN

## 2021-08-28 MED ORDER — OXYCODONE HCL 5 MG PO TABS: 5.0000 mg | ORAL_TABLET | Freq: Four times a day (QID) | ORAL | 0 refills | Status: DC | PRN

## 2021-08-28 MED ORDER — ASPIRIN 81 MG PO CHEW: 81.0000 mg | CHEWABLE_TABLET | Freq: Two times a day (BID) | ORAL | 0 refills | Status: DC

## 2021-08-28 NOTE — Plan of Care (Signed)
  Problem: Education: Goal: Knowledge of General Education information will improve Description: Including pain rating scale, medication(s)/side effects and non-pharmacologic comfort measures Outcome: Progressing   Problem: Activity: Goal: Risk for activity intolerance will decrease Outcome: Progressing   Problem: Pain Managment: Goal: General experience of comfort will improve Outcome: Progressing   

## 2021-08-28 NOTE — Progress Notes (Signed)
Physical Therapy Treatment ?Patient Details ?Name: John Hogan. ?MRN: [id number] ?DOB: October 23, 1950 ?Today's Date: 08/28/2021 ? ? ?History of Present Illness Pt s/p R THR and with hx of CKD, CHF and hytertensive cardiomyopathy ? ?  ?PT Comments  ? ? Pt progressing toward PT goals. Amb ~ 31' and began exercises. Will see again today for stair training, pt is motivated to d/c home this afternoon.    ?Recommendations for follow up therapy are one component of a multi-disciplinary discharge planning process, led by the attending physician.  Recommendations may be updated based on patient status, additional functional criteria and insurance authorization. ? ?Follow Up Recommendations ? Follow physician's recommendations for discharge plan and follow up therapies ?  ?  ?Assistance Recommended at Discharge Intermittent Supervision/Assistance  ?Patient can return home with the following A little help with walking and/or transfers;A little help with bathing/dressing/bathroom;Assistance with cooking/housework;Help with stairs or ramp for entrance;Assist for transportation ?  ?Equipment Recommendations ? Rolling walker (2 wheels);BSC/3in1  ?  ?Recommendations for Other Services   ? ? ?  ?Precautions / Restrictions Precautions ?Precautions: Fall ?Restrictions ?Weight Bearing Restrictions: No ?RLE Weight Bearing: Weight bearing as tolerated ?Other Position/Activity Restrictions: WBAT  ?  ? ?Mobility ? Bed Mobility ?Overal bed mobility: Needs Assistance ?Bed Mobility: Supine to Sit ?  ?  ?Supine to sit: Min assist ?  ?  ?General bed mobility comments: increased time with extensive use of bedrails and min assist to manage R LE ?  ? ?Transfers ?Overall transfer level: Needs assistance ?Equipment used: Rolling walker (2 wheels) ?Transfers: Sit to/from Stand ?Sit to Stand: From elevated surface, Min guard ?  ?  ?  ?  ?  ?General transfer comment: cues for LE management and use of UEs to self assist ?   ? ?Ambulation/Gait ?Ambulation/Gait assistance: Min guard, Supervision ?Gait Distance (Feet): 75 Feet ?Assistive device: Rolling walker (2 wheels) ?Gait Pattern/deviations: Step-to pattern, Trunk flexed, Decreased stance time - right ?Gait velocity: decr ?  ?  ?General Gait Details: cues for posture, position from RW and initial sequence ? ? ?Stairs ?  ?  ?  ?  ?  ? ? ?Wheelchair Mobility ?  ? ?Modified Rankin (Stroke Patients Only) ?  ? ? ?  ?Balance Overall balance assessment: Needs assistance ?Sitting-balance support: No upper extremity supported, Feet supported ?Sitting balance-Leahy Scale: Good ?  ?  ?Standing balance support: Single extremity supported ?Standing balance-Leahy Scale: Poor ?  ?  ?  ?  ?  ?  ?  ?  ?  ?  ?  ?  ?  ? ?  ?Cognition Arousal/Alertness: Awake/alert ?Behavior During Therapy: [facility] for tasks assessed/performed ?Overall Cognitive Status: Within Functional Limits for tasks assessed ?  ?  ?  ?  ?  ?  ?  ?  ?  ?  ?  ?  ?  ?  ?  ?  ?  ?  ?  ? ?  ?Exercises Total Joint Exercises ?Ankle Circles/Pumps: AROM, Both, 15 reps, Supine ?Quad Sets: AROM, Both, 5 reps ? ?  ?General Comments   ?  ?  ? ?Pertinent Vitals/Pain Pain Assessment ?Pain Assessment: 0-10 ?Pain Score: 6  ?Pain Location: R hip ?Pain Descriptors / Indicators: Aching, Sore ?Pain Intervention(s): Limited activity within patient's tolerance, Monitored during session, Premedicated before session, Repositioned (pt wanted to leave ice off)  ? ? ?Home Living   ?  ?  ?  ?  ?  ?  ?  ?  ?  ?   ?  ?  Prior Function    ?  ?  ?   ? ?PT Goals (current goals can now be found in the care plan section) Acute Rehab PT Goals ?Patient Stated Goal: Regain IND ?PT Goal Formulation: With patient ?Time For Goal Achievement: 09/03/21 ?Potential to Achieve Goals: Good ?Progress towards PT goals: Progressing toward goals ? ?  ?Frequency ? ? ? 7X/week ? ? ? ?  ?PT Plan Current plan remains appropriate  ? ? ?Co-evaluation   ?  ?  ?  ?  ? ?  ?AM-PAC PT "6 Clicks"  Mobility   ?Outcome Measure ? Help needed turning from your back to your side while in a flat bed without using bedrails?: A Little ?Help needed moving from lying on your back to sitting on the side of a flat bed without using bedrails?: A Little ?Help needed moving to and from a bed to a chair (including a wheelchair)?: A Little ?Help needed standing up from a chair using your arms (e.g., wheelchair or bedside chair)?: A Little ?Help needed to walk in hospital room?: A Little ?Help needed climbing 3-5 steps with a railing? : A Little ?6 Click Score: 18 ? ?  ?End of Session Equipment Utilized During Treatment: Gait belt ?Activity Tolerance: Patient tolerated treatment well ?Patient left: in chair;with call bell/phone within reach ?Nurse Communication: Mobility status ?PT Visit Diagnosis: Difficulty in walking, not elsewhere classified (R26.2) ?  ? ? ?Time: 7253-6644 ?PT Time Calculation (min) (ACUTE ONLY): 15 min ? ?Charges:  $Gait Training: 8-22 mins          ?          ? John Hogan, PT ? ?Acute Rehab Dept [facility]) [phone] ?Pager [phone] ? ?08/28/2021 ? ? ? ?John Hogan ?08/28/2021, 10:36 AM ? ?

## 2021-08-28 NOTE — Discharge Instructions (Signed)
INSTRUCTIONS AFTER JOINT REPLACEMENT  ° °Remove items at home which could result in a fall. This includes throw rugs or furniture in walking pathways °ICE to the affected joint every three hours while awake for 30 minutes at a time, for at least the first 3-5 days, and then as needed for pain and swelling.  Continue to use ice for pain and swelling. You may notice swelling that will progress down to the foot and ankle.  This is normal after surgery.  Elevate your leg when you are not up walking on it.   °Continue to use the breathing machine you got in the hospital (incentive spirometer) which will help keep your temperature down.  It is common for your temperature to cycle up and down following surgery, especially at night when you are not up moving around and exerting yourself.  The breathing machine keeps your lungs expanded and your temperature down. ° ° °DIET:  As you were doing prior to hospitalization, we recommend a well-balanced diet. ° °DRESSING / WOUND CARE / SHOWERING ° °Keep the surgical dressing until follow up.  The dressing is water proof, so you can shower without any extra covering.  IF THE DRESSING FALLS OFF or the wound gets wet inside, change the dressing with sterile gauze.  Please use good hand washing techniques before changing the dressing.  Do not use any lotions or creams on the incision until instructed by your surgeon.   ° °ACTIVITY ° °Increase activity slowly as tolerated, but follow the weight bearing instructions below.   °No driving for 6 weeks or until further direction given by your physician.  You cannot drive while taking narcotics.  °No lifting or carrying greater than 10 lbs. until further directed by your surgeon. °Avoid periods of inactivity such as sitting longer than an hour when not asleep. This helps prevent blood clots.  °You may return to work once you are authorized by your doctor.  ° ° ° °WEIGHT BEARING  ° °Weight bearing as tolerated with assist device (walker, cane,  etc) as directed, use it as long as suggested by your surgeon or therapist, typically at least 4-6 weeks. ° ° °EXERCISES ° °Results after joint replacement surgery are often greatly improved when you follow the exercise, range of motion and muscle strengthening exercises prescribed by your doctor. Safety measures are also important to protect the joint from further injury. Any time any of these exercises cause you to have increased pain or swelling, decrease what you are doing until you are comfortable again and then slowly increase them. If you have problems or questions, call your caregiver or physical therapist for advice.  ° °Rehabilitation is important following a joint replacement. After just a few days of immobilization, the muscles of the leg can become weakened and shrink (atrophy).  These exercises are designed to build up the tone and strength of the thigh and leg muscles and to improve motion. Often times heat used for twenty to thirty minutes before working out will loosen up your tissues and help with improving the range of motion but do not use heat for the first two weeks following surgery (sometimes heat can increase post-operative swelling).  ° °These exercises can be done on a training (exercise) mat, on the floor, on a table or on a bed. Use whatever works the best and is most comfortable for you.    Use music or television while you are exercising so that the exercises are a pleasant break in your   day. This will make your life better with the exercises acting as a break in your routine that you can look forward to.   Perform all exercises about fifteen times, three times per day or as directed.  You should exercise both the operative leg and the other leg as well.  Exercises include:   Quad Sets - Tighten up the muscle on the front of the thigh (Quad) and hold for 5-10 seconds.   Straight Leg Raises - With your knee straight (if you were given a brace, keep it on), lift the leg to 60  degrees, hold for 3 seconds, and slowly lower the leg.  Perform this exercise against resistance later as your leg gets stronger.  Leg Slides: Lying on your back, slowly slide your foot toward your buttocks, bending your knee up off the floor (only go as far as is comfortable). Then slowly slide your foot back down until your leg is flat on the floor again.  Angel Wings: Lying on your back spread your legs to the side as far apart as you can without causing discomfort.  Hamstring Strength:  Lying on your back, push your heel against the floor with your leg straight by tightening up the muscles of your buttocks.  Repeat, but this time bend your knee to a comfortable angle, and push your heel against the floor.  You may put a pillow under the heel to make it more comfortable if necessary.   A rehabilitation program following joint replacement surgery can speed recovery and prevent re-injury in the future due to weakened muscles. Contact your doctor or a physical therapist for more information on knee rehabilitation.    CONSTIPATION  Constipation is defined medically as fewer than three stools per week and severe constipation as less than one stool per week.  Even if you have a regular bowel pattern at home, your normal regimen is likely to be disrupted due to multiple reasons following surgery.  Combination of anesthesia, postoperative narcotics, change in appetite and fluid intake all can affect your bowels.   YOU MUST use at least one of the following options; they are listed in order of increasing strength to get the job done.  They are all available over the counter, and you may need to use some, POSSIBLY even all of these options:    Drink plenty of fluids (prune juice may be helpful) and high fiber foods Colace 100 mg by mouth twice a day  Senokot for constipation as directed and as needed Dulcolax (bisacodyl), take with full glass of water  Miralax (polyethylene glycol) once or twice a day as  needed.  If you have tried all these things and are unable to have a bowel movement in the first 3-4 days after surgery call either your surgeon or your primary doctor.    If you experience loose stools or diarrhea, hold the medications until you stool forms back up.  If your symptoms do not get better within 1 week or if they get worse, check with your doctor.  If you experience "the worst abdominal pain ever" or develop nausea or vomiting, please contact the office immediately for further recommendations for treatment.   ITCHING:  If you experience itching with your medications, try taking only a single pain pill, or even half a pain pill at a time.  You can also use Benadryl over the counter for itching or also to help with sleep.   TED HOSE STOCKINGS:  Use stockings on both  legs until for at least 2 weeks or as directed by physician office. They may be removed at night for sleeping.  MEDICATIONS:  See your medication summary on the After Visit Summary that nursing will review with you.  You may have some home medications which will be placed on hold until you complete the course of blood thinner medication.  It is important for you to complete the blood thinner medication as prescribed.  PRECAUTIONS:  If you experience chest pain or shortness of breath - call 911 immediately for transfer to the hospital emergency department.   If you develop a fever greater that 101 F, purulent drainage from wound, increased redness or drainage from wound, foul odor from the wound/dressing, or calf pain - CONTACT YOUR SURGEON.                                                   FOLLOW-UP APPOINTMENTS:  If you do not already have a post-op appointment, please call the office for an appointment to be seen by your surgeon.  Guidelines for how soon to be seen are listed in your After Visit Summary, but are typically between 1-4 weeks after surgery.  OTHER INSTRUCTIONS:   Knee Replacement:  Do not place pillow  under knee, focus on keeping the knee straight while resting. CPM instructions: 0-90 degrees, 2 hours in the morning, 2 hours in the afternoon, and 2 hours in the evening. Place foam block, curve side up under heel at all times except when in CPM or when walking.  DO NOT modify, tear, cut, or change the foam block in any way.  POST-OPERATIVE OPIOID TAPER INSTRUCTIONS: It is important to wean off of your opioid medication as soon as possible. If you do not need pain medication after your surgery it is ok to stop day one. Opioids include: Codeine, Hydrocodone(Norco, Vicodin), Oxycodone(Percocet, oxycontin) and hydromorphone amongst others.  Long term and even short term use of opiods can cause: Increased pain response Dependence Constipation Depression Respiratory depression And more.  Withdrawal symptoms can include Flu like symptoms Nausea, vomiting And more Techniques to manage these symptoms Hydrate well Eat regular healthy meals Stay active Use relaxation techniques(deep breathing, meditating, yoga) Do Not substitute Alcohol to help with tapering If you have been on opioids for less than two weeks and do not have pain than it is ok to stop all together.  Plan to wean off of opioids This plan should start within one week post op of your joint replacement. Maintain the same interval or time between taking each dose and first decrease the dose.  Cut the total daily intake of opioids by one tablet each day Next start to increase the time between doses. The last dose that should be eliminated is the evening dose.   MAKE SURE YOU:  Understand these instructions.  Get help right away if you are not doing well or get worse.    Thank you for letting us be a part of your medical care team.  It is a privilege we respect greatly.  We hope these instructions will help you stay on track for a fast and full recovery!     Dental Antibiotics:  In most cases prophylactic antibiotics for  Dental procdeures after total joint surgery are not necessary.  Exceptions are as follows:  1. History of prior  total joint infection  2. Severely immunocompromised (Organ Transplant, cancer chemotherapy, Rheumatoid biologic meds such as Prentice)  3. Poorly controlled diabetes (A1C &gt; 8.0, blood glucose over 200)  If you have one of these conditions, contact your surgeon for an antibiotic prescription, prior to your dental procedure.

## 2021-08-28 NOTE — Progress Notes (Signed)
Physical Therapy Treatment ?Patient Details ?Name: John Hogan. ?MRN: [id number] ?DOB: 01/31/1951 ?Today's Date: 08/28/2021 ? ? ?History of Present Illness Pt s/p R THR and with hx of CKD, CHF and hytertensive cardiomyopathy ? ?  ?PT Comments  ? ? Excellent progress. Meeting goals. Ready for d/c from PT standpoint with family assist as needed   ?Recommendations for follow up therapy are one component of a multi-disciplinary discharge planning process, led by the attending physician.  Recommendations may be updated based on patient status, additional functional criteria and insurance authorization. ? ?Follow Up Recommendations ? Follow physician's recommendations for discharge plan and follow up therapies ?  ?  ?Assistance Recommended at Discharge Intermittent Supervision/Assistance  ?Patient can return home with the following A little help with walking and/or transfers;A little help with bathing/dressing/bathroom;Assistance with cooking/housework;Help with stairs or ramp for entrance;Assist for transportation ?  ?Equipment Recommendations ? Rolling walker (2 wheels);BSC/3in1  ?  ?Recommendations for Other Services   ? ? ?  ?Precautions / Restrictions Precautions ?Precautions: Fall ?Restrictions ?Weight Bearing Restrictions: No ?Other Position/Activity Restrictions: WBAT  ?  ? ?Mobility ? Bed Mobility ?Overal bed mobility: Needs Assistance ?Bed Mobility: Sit to Supine ?  ?  ?Supine to sit: Min assist ?  ?  ?General bed mobility comments: increased time with extensive use of bedrails and min assist to manage R LE ?  ? ?Transfers ?Overall transfer level: Needs assistance ?Equipment used: Rolling walker (2 wheels) ?Transfers: Sit to/from Stand ?Sit to Stand: Min guard ?  ?  ?  ?  ?  ?General transfer comment: cues for LE management and use of UEs to self assist ?  ? ?Ambulation/Gait ?Ambulation/Gait assistance: Min guard, Supervision ?Gait Distance (Feet): 40 Feet ?Assistive device: Rolling walker (2 wheels) ?Gait  Pattern/deviations: Step-to pattern, Trunk flexed, Decreased stance time - right ?Gait velocity: decr ?  ?  ?General Gait Details: cues for posture, position from RW and initial sequence ? ? ?Stairs ?Stairs: Yes ?Stairs assistance: Min assist ?Stair Management: One rail Right, One rail Left, Step to pattern, Sideways ?Number of Stairs: 3 ?General stair comments: cues for sequence and safe technique ? ? ?Wheelchair Mobility ?  ? ?Modified Rankin (Stroke Patients Only) ?  ? ? ?  ?Balance Overall balance assessment: Needs assistance ?Sitting-balance support: No upper extremity supported, Feet supported ?Sitting balance-Leahy Scale: Good ?  ?  ?Standing balance support: Single extremity supported ?Standing balance-Leahy Scale: Poor ?  ?  ?  ?  ?  ?  ?  ?  ?  ?  ?  ?  ?  ? ?  ?Cognition Arousal/Alertness: Awake/alert ?Behavior During Therapy: [facility] for tasks assessed/performed ?Overall Cognitive Status: Within Functional Limits for tasks assessed ?  ?  ?  ?  ?  ?  ?  ?  ?  ?  ?  ?  ?  ?  ?  ?  ?  ?  ?  ? ?  ?Exercises Total Joint Exercises ?Ankle Circles/Pumps: AROM, Both, 15 reps, Supine ?Quad Sets: AROM, Both, 5 reps ? ?  ?General Comments   ?  ?  ? ?Pertinent Vitals/Pain Pain Assessment ?Pain Assessment: 0-10 ?Pain Score: 4  ?Pain Location: R hip ?Pain Descriptors / Indicators: Aching, Sore ?Pain Intervention(s): Monitored during session, Limited activity within patient's tolerance, Premedicated before session  ? ? ?Home Living   ?  ?  ?  ?  ?  ?  ?  ?  ?  ?   ?  ?Prior  Function    ?  ?  ?   ? ?PT Goals (current goals can now be found in the care plan section) Acute Rehab PT Goals ?Patient Stated Goal: Regain IND ?PT Goal Formulation: With patient ?Time For Goal Achievement: 09/03/21 ?Potential to Achieve Goals: Good ?Progress towards PT goals: Progressing toward goals ? ?  ?Frequency ? ? ? 7X/week ? ? ? ?  ?PT Plan Current plan remains appropriate  ? ? ?Co-evaluation   ?  ?  ?  ?  ? ?  ?AM-PAC PT "6 Clicks" Mobility    ?Outcome Measure ? Help needed turning from your back to your side while in a flat bed without using bedrails?: A Little ?Help needed moving from lying on your back to sitting on the side of a flat bed without using bedrails?: A Little ?Help needed moving to and from a bed to a chair (including a wheelchair)?: A Little ?Help needed standing up from a chair using your arms (e.g., wheelchair or bedside chair)?: A Little ?Help needed to walk in hospital room?: A Little ?Help needed climbing 3-5 steps with a railing? : A Little ?6 Click Score: 18 ? ?  ?End of Session Equipment Utilized During Treatment: Gait belt ?Activity Tolerance: Patient tolerated treatment well ?Patient left: in chair;with call bell/phone within reach ?Nurse Communication: Mobility status ?PT Visit Diagnosis: Difficulty in walking, not elsewhere classified (R26.2) ?  ? ? ?Time: 4888-9169 ?PT Time Calculation (min) (ACUTE ONLY): 23 min ? ?Charges:  $Gait Training: 23-37 mins          ?          ? Baxter Flattery, PT ? ?Acute Rehab Dept [address] [facility]) [phone] ?Pager [phone] ? ?08/28/2021 ? ? ? ?John Hogan ?08/28/2021, 4:26 PM ? ?

## 2021-08-28 NOTE — Discharge Summary (Signed)
?Patient ID: ?John Hogan. ?MRN: [id number] ?DOB/AGE: [birth year] 72 y.o. ? ?Admit date: 08/27/2021 ?Discharge date: 08/28/2021 ? ?Admission Diagnoses:  ?Principal Problem: ?  Unilateral primary osteoarthritis, right hip ?Active Problems: ?  Status post total replacement of right hip ? ? ?Discharge Diagnoses:  ?Same ? ?Past Medical History:  ?Diagnosis Date  ? AI (aortic insufficiency)   ? Arthritis   ? CHF (congestive heart failure) ([place])   ? Chronic kidney disease   ? Glaucoma   ? Gout   ? H/O exercise stress test   ? Myoview 01/03/12: No scar or ischemia, EF 53%  ? Hyperlipidemia   ? Hypertension   ? Hypertensive cardiomyopathy ([place])   ? EF previously 35%;  echo 12/2012 EF 55%, mild AI, ascending aorta mildly dilated, mild LAE  ? ? ?Surgeries: Procedure(s): ?Right HIP ARTHROPLASTY ANTERIOR APPROACH on 08/27/2021 ?  ?Consultants:  ? ?Discharged Condition: Improved ? ?Hospital Course: John Hogan. is an 71 y.o. male who was admitted 08/27/2021 for operative treatment ofUnilateral primary osteoarthritis, right hip. Patient has severe unremitting pain that affects sleep, daily activities, and work/hobbies. After pre-op clearance the patient was taken to the operating room on 08/27/2021 and underwent  Procedure(s): ?Right HIP ARTHROPLASTY ANTERIOR APPROACH.   ? ?Patient was given perioperative antibiotics:  ?Anti-infectives (From admission, onward)  ? ? Start     Dose/Rate Route Frequency Ordered Stop  ? 08/27/21 2000  ceFAZolin (ANCEF) IVPB 1 g/50 mL premix       ? 1 g ?100 mL/hr over 30 Minutes Intravenous Every 6 hours 08/27/21 1657 08/28/21 0156  ? 08/27/21 1130  ceFAZolin (ANCEF) IVPB 2g/100 mL premix       ? 2 g ?200 mL/hr over 30 Minutes Intravenous On call to O.R. 08/27/21 1120 08/27/21 1424  ? ?  ?  ? ?Patient was given sequential compression devices, early ambulation, and chemoprophylaxis to prevent DVT. ? ?Patient benefited maximally from hospital stay and there were no complications.   ? ?Recent vital signs:  Patient Vitals for the past 24 hrs: ? BP Temp Temp src Pulse Resp SpO2 Height Weight  ?08/28/21 0923 139/80 99.6 ?F (37.6 ?C) -- 87 17 98 % -- --  ?08/28/21 0834 136/88 98.5 ?F (36.9 ?C) Oral 87 16 96 % -- --  ?08/28/21 0526 (!) 129/92 99.3 ?F (37.4 ?C) -- 94 17 97 % -- --  ?08/28/21 0120 135/88 97.9 ?F (36.6 ?C) -- 95 17 95 % -- --  ?08/27/21 2137 (!) 152/94 98.8 ?F (37.1 ?C) -- (!) 102 17 98 % -- --  ?08/27/21 1643 (!) 157/79 97.8 ?F (36.6 ?C) Axillary (!) 49 16 98 % -- --  ?08/27/21 1615 -- -- -- (!) 44 18 97 % -- --  ?08/27/21 1600 (!) 158/83 -- -- 68 19 97 % -- --  ?08/27/21 1530 (!) 168/82 -- -- 63 20 98 % -- --  ?08/27/21 1220 -- -- -- -- -- -- '5\' 11"'$  (1.803 m) 101.2 kg  ?08/27/21 1141 137/73 (!) 97.3 ?F (36.3 ?C) Oral (!) 47 18 100 % -- --  ?  ? ?Recent laboratory studies:  ?Recent Labs  ?  08/28/21 ?7412  ?WBC 8.0  ?HGB 13.9  ?HCT 41.2  ?PLT 184  ?NA 134*  ?K 3.8  ?CL 102  ?CO2 23  ?BUN 28*  ?CREATININE 2.07*  ?GLUCOSE 165*  ?CALCIUM 8.7*  ? ? ? ?Discharge Medications:   ?Allergies as of 08/28/2021   ?No Known Allergies ?  ? ?  ?  Medication List  ?  ? ?TAKE these medications   ? ?allopurinol 300 MG tablet ?Commonly known as: ZYLOPRIM ?Take 1 tablet (300 mg total) by mouth daily. ?  ?aspirin 81 MG chewable tablet ?Chew 1 tablet (81 mg total) by mouth 2 (two) times daily. ?  ?benzonatate 200 MG capsule ?Commonly known as: TESSALON ?Take 1 capsule (200 mg total) by mouth 3 (three) times daily as needed. ?  ?colchicine 0.6 MG tablet ?Take 1 tablet (0.6 mg total) by mouth daily. When having flare ?What changed:  ?when to take this ?reasons to take this ?  ?methocarbamol 500 MG tablet ?Commonly known as: ROBAXIN ?Take 1 tablet (500 mg total) by mouth every 6 (six) hours as needed for muscle spasms. ?  ?oxyCODONE 5 MG immediate release tablet ?Commonly known as: Oxy IR/ROXICODONE ?Take 1-2 tablets (5-10 mg total) by mouth every 6 (six) hours as needed for moderate pain (pain score 4-6). ?  ?pravastatin 20 MG  tablet ?Commonly known as: PRAVACHOL ?TAKE 1 TABLET BY MOUTH EVERY DAY ?  ?sildenafil 20 MG tablet ?Commonly known as: REVATIO ?TAKE 4 TABLETS BY MOUTH DAILY AS NEEDED. ?  ?Simbrinza 1-0.2 % Susp ?Generic drug: Brinzolamide-Brimonidine ?Place 1 drop into both eyes in the morning and at bedtime. ?  ?spironolactone 25 MG tablet ?Commonly known as: ALDACTONE ?Take 1 tablet (25 mg total) by mouth daily. ?  ?timolol 0.5 % ophthalmic solution ?Commonly known as: TIMOPTIC ?Place 1 drop into both eyes 2 (two) times daily. ?  ?torsemide 20 MG tablet ?Commonly known as: DEMADEX ?TAKE 1 TABLET BY MOUTH EVERY DAY ?  ?traMADol 50 MG tablet ?Commonly known as: Ultram ?Take 1 tablet (50 mg total) by mouth every 6 (six) hours as needed. ?  ?Travoprost (BAK Free) 0.004 % Soln ophthalmic solution ?Commonly known as: TRAVATAN ?Place 1 drop into both eyes at bedtime. ?  ?triamcinolone cream 0.1 % ?Commonly known as: KENALOG ?APPLY TO AFFECTED AREA TWICE A DAY ?  ?valsartan 80 MG tablet ?Commonly known as: Diovan ?Take 1 tablet (80 mg total) by mouth daily. ?  ?Vitamin D3 50 MCG (2000 UT) capsule ?TAKE 1 CAPSULE BY MOUTH EVERY DAY ?  ?zolpidem 5 MG tablet ?Commonly known as: AMBIEN ?TAKE 1 TABLET(5 MG) BY MOUTH AT BEDTIME AS NEEDED FOR SLEEP ?  ? ?  ? ?  ?  ? ? ?  ?Durable Medical Equipment  ?(From admission, onward)  ?  ? ? ?  ? ?  Start     Ordered  ? 08/27/21 1658  DME 3 n 1  Once       ? 08/27/21 1657  ? 08/27/21 1658  DME Walker rolling  Once       ?Question Answer Comment  ?Walker: With 5 Inch Wheels   ?Patient needs a walker to treat with the following condition Status post total replacement of right hip   ?  ? 08/27/21 1657  ? ?  ?  ? ?  ? ? ?Diagnostic Studies: DG Pelvis Portable ? ?Result Date: 08/27/2021 ?CLINICAL DATA:  Postop right hip replacement EXAM: PORTABLE PELVIS 1-2 VIEWS COMPARISON:  Intraoperative hip radiographs obtained earlier the same day, hip radiographs 02/22/2021 FINDINGS: The patient is status post right hip  arthroplasty. Hardware alignment is within expected limits, without evidence of complication. Lucency in the soft tissues likely reflects postoperative soft tissue gas. Mild degenerative changes about the left hip are again seen. The SI joints and symphysis pubis are intact. IMPRESSION: Status post  right hip replacement without evidence of complication. Electronically Signed   By: Valetta Mole M.D.   On: 08/27/2021 16:14  ? ?DG C-Arm 1-60 Min-No Report ? ?Result Date: 08/27/2021 ?Fluoroscopy was utilized by the requesting physician.  No radiographic interpretation.  ? ?DG HIP UNILAT WITH PELVIS 1V RIGHT ? ?Result Date: 08/27/2021 ?CLINICAL DATA:  Status post right hip arthroplasty EXAM: DG HIP (WITH OR WITHOUT PELVIS) 1V RIGHT COMPARISON:  02/22/2021 FINDINGS: Three images from portable C-arm radiography obtained in the operating room show changes of right total hip arthroplasty. No signs of periprosthetic fracture or subluxation. IMPRESSION: Status post right total hip arthroplasty. Electronically Signed   By: Kerby Moors M.D.   On: 08/27/2021 15:02   ? ?Disposition: Discharge disposition: 01-Home or Self Care ? ? ? ? ? ? ?Discharge Instructions   ? ? Discharge patient   Complete by: As directed ?  ? Discharge disposition: 01-Home or Self Care  ? Discharge patient date: 08/28/2021  ? ?  ? ? ? Follow-up Information   ? ? Mcarthur Rossetti, MD Follow up in 2 week(s).   ?Specialty: Orthopedic Surgery ?Contact information: ?[address] ?[place] [address] ?[phone] ? ? ?  ?  ? ?  ?  ? ?  ? ? ? ?Signed: ?Mcarthur Rossetti ?08/28/2021, 11:12 AM ? ?  ?

## 2021-08-28 NOTE — TOC Transition Note (Signed)
Transition of Care (TOC) - CM/SW Discharge Note ? ? ?Patient Details  ?Name: John Hogan. ?MRN: [id number] ?Date of Birth: May 04, 1951 ? ?Transition of Care (TOC) CM/SW Contact:  ?Ross Ludwig, LCSW ?Phone Number: ?08/28/2021, 11:43 AM ? ? ?Clinical Narrative:    ? ?CSW received referral that patient will need HH PT set up.  CSW spoke to patient, he did not have a preference for an agency.  CSW spoke to [facility], they can accept patient for home health PT.  Patient will be going home with home health through [facility].  CSW signing off please reconsult with any other social work needs, home health agency has been notified of planned discharge. ? ? ? ?Final next level of care: [place] ?Barriers to Discharge: Barriers Resolved ? ? ?Patient Goals and CMS Choice ?Patient states their goals for this hospitalization and ongoing recovery are:: To return back home. ?CMS Medicare.gov Compare Post Acute Care list provided to:: Patient ?Choice offered to / list presented to : Patient ? ?Discharge Placement ? Home with home health. ?           ?  ?  ?  ?  ? ?Discharge Plan and Services ? Home with home health. ?  ?           ?DME Arranged: 3-N-1, [place] rolling ?DME Agency: [employer] ?Date DME Agency Contacted: 08/28/21 ?Time DME Agency Contacted: [phone] ?Representative spoke with at DME Agency: Delana Meyer ?HH Arranged: PT ?[place] Agency: [facility] ?Date HH Agency Contacted: 08/28/21 ?Time [place]: 3016 ?Representative spoke with at [place]: [facility] ? ?Social Determinants of Health (SDOH) Interventions ?  ? ? ?Readmission Risk Interventions ?No flowsheet data found. ? ? ? ? ?

## 2021-08-28 NOTE — Progress Notes (Signed)
Subjective: ?1 Day Post-Op Procedure(s) (LRB): ?Right HIP ARTHROPLASTY ANTERIOR APPROACH (Right) ?Patient reports pain as moderate.   ? ?Objective: ?Vital signs in last 24 hours: ?Temp:  [97.3 ?F (36.3 ?C)-99.6 ?F (37.6 ?C)] 99.6 ?F (37.6 ?C) (03/04 1610) ?Pulse Rate:  [44-102] 87 (03/04 0923) ?Resp:  [16-20] 17 (03/04 9604) ?BP: (129-168)/(73-94) 139/80 (03/04 5409) ?SpO2:  [95 %-100 %] 98 % (03/04 0923) ?Weight:  [101.2 kg] 101.2 kg (03/03 1220) ? ?Intake/Output from previous day: ?03/03 0701 - 03/04 0700 ?In: 2833.8 [P.O.:900; I.V.:1633.8; IV Piggyback:300] ?Out: 1400 [Urine:1250; Blood:150] ?Intake/Output this shift: ?Total I/O ?In: 240 [P.O.:240] ?Out: 420 [Urine:420] ? ?Recent Labs  ?  08/28/21 ?8119  ?HGB 13.9  ? ?Recent Labs  ?  08/28/21 ?1478  ?WBC 8.0  ?RBC 4.44  ?HCT 41.2  ?PLT 184  ? ?Recent Labs  ?  08/28/21 ?2956  ?NA 134*  ?K 3.8  ?CL 102  ?CO2 23  ?BUN 28*  ?CREATININE 2.07*  ?GLUCOSE 165*  ?CALCIUM 8.7*  ? ?No results for input(s): LABPT, INR in the last 72 hours. ? ?Sensation intact distally ?Intact pulses distally ?Dorsiflexion/Plantar flexion intact ?Incision: no drainage ? ? ?Assessment/Plan: ?1 Day Post-Op Procedure(s) (LRB): ?Right HIP ARTHROPLASTY ANTERIOR APPROACH (Right) ?Up with therapy ?Discharge home with home health ? ? ? ? ? ?Mcarthur Rossetti ?08/28/2021, 11:10 AM ? ?

## 2021-08-28 NOTE — Plan of Care (Signed)
  Problem: Coping: Goal: Level of anxiety will decrease Outcome: Progressing   Problem: Pain Managment: Goal: General experience of comfort will improve Outcome: Progressing   Problem: Safety: Goal: Ability to remain free from injury will improve Outcome: Progressing   

## 2021-08-30 ENCOUNTER — Encounter (HOSPITAL_COMMUNITY): Payer: Self-pay | Admitting: Orthopaedic Surgery

## 2021-08-30 ENCOUNTER — Telehealth: Payer: Self-pay | Admitting: Radiology

## 2021-08-30 NOTE — Telephone Encounter (Signed)
Left verbal on VM for John Hogan ?

## 2021-08-30 NOTE — Telephone Encounter (Signed)
Herbert Deaner from [facility] called to let our office know that he went and done his assessment on patient. States that they changed dressing once after they got home. And now aqua cell dressing is full at the upper 3rd, B/P is 140/90 seated and 110/70 standing and is asymptomatic. Last bowel movement was 08/27/21. And that he would like orders for 3 week 1 and 2 week 3. Please call him to advise ?

## 2021-09-01 ENCOUNTER — Telehealth: Payer: Self-pay | Admitting: Cardiology

## 2021-09-01 NOTE — Telephone Encounter (Signed)
STAT if HR is under 50 or over 120 ?(normal HR is 60-100 beats per minute) ? ?What is your heart rate? 90 ? ?Do you have a log of your heart rate readings (document readings)?  ? Monday 08/30/21: 90 ? Today    09/01/21: 90 ? ?Do you have any other symptoms? No. Patient is asymptomatic ? ? ?John Hogan from [facility] saw the patient both Monday and Today and his resting HR was 90 both days. The patient wanted Dr. Percival Spanish to be aware  ?

## 2021-09-01 NOTE — Telephone Encounter (Signed)
Spoke to  [place]  with [facility] ?  States heart rate resting 90's to low 100's with activity rate 110 to 115.   [name] states he called because the patient mention heart is usually in the 50's .  Patient not symptomatic/ ? Aware will defer to Dr Percival Spanish  ? Contact patient and [name] with further instructions ?

## 2021-09-06 NOTE — Telephone Encounter (Signed)
Per Dr Percival Spanish  ? ?He has increased HR.  He should have follow up with me in April.  Please schedule an appt.     ? ? ? RN called and spoke to patient The current medical regimen is effective;  continue present plan and with physical therapy.  Patient verbalized  understanding and states he will be at appointment 10/22/21. ? RN  also left  detail message for  Serita Kyle with [facility]. Any question may call back  ?

## 2021-09-09 ENCOUNTER — Encounter: Payer: Self-pay | Admitting: Physician Assistant

## 2021-09-09 ENCOUNTER — Ambulatory Visit (INDEPENDENT_AMBULATORY_CARE_PROVIDER_SITE_OTHER): Payer: 59 | Admitting: Physician Assistant

## 2021-09-09 DIAGNOSIS — Z96641 Presence of right artificial hip joint: Secondary | ICD-10-CM

## 2021-09-09 NOTE — Progress Notes (Signed)
HPI: Mr. Harig returns today 2 weeks status post right total hip arthroplasty.  He states he is overall doing very well.  Taking no pain medications.  States pain is 4 out of 10 at worst.  Is on aspirin twice daily for DVT prophylaxis.  He was on no aspirin prior to surgery.  He has no complaints.  Denies any fevers chills shortness of breath or chest pain. ? ?Physical exam: Right hip surgical incisions healing well no signs of infection.Incision is healing well approximated with staples.  Slight hematoma versus seroma.  Attempted aspirated and sent dry tap.  Right calf supple nontender.  Limited internal/external rotation of the right hip.  Dorsiflexion plantarflexion right ankle intact. ? ?Impression: Status post right total hip arthroplasty ? ?Plan: He will continue on aspirin once daily for another week and then discontinue as he was on no aspirin prior to surgery.  Staples were removed today Steri-Strips applied.  Scar tissue mobilization encouraged.  Follow-up with [place] in 1 month sooner if there is any questions concerns ?

## 2021-09-10 ENCOUNTER — Other Ambulatory Visit: Payer: Self-pay | Admitting: Internal Medicine

## 2021-09-10 DIAGNOSIS — M159 Polyosteoarthritis, unspecified: Secondary | ICD-10-CM

## 2021-09-10 DIAGNOSIS — I5042 Chronic combined systolic (congestive) and diastolic (congestive) heart failure: Secondary | ICD-10-CM

## 2021-09-10 DIAGNOSIS — I10 Essential (primary) hypertension: Secondary | ICD-10-CM

## 2021-09-10 DIAGNOSIS — E785 Hyperlipidemia, unspecified: Secondary | ICD-10-CM

## 2021-09-10 DIAGNOSIS — N522 Drug-induced erectile dysfunction: Secondary | ICD-10-CM

## 2021-09-10 DIAGNOSIS — F5104 Psychophysiologic insomnia: Secondary | ICD-10-CM

## 2021-09-10 DIAGNOSIS — M1000 Idiopathic gout, unspecified site: Secondary | ICD-10-CM

## 2021-09-10 MED ORDER — TORSEMIDE 20 MG PO TABS: 20.0000 mg | ORAL_TABLET | Freq: Every day | ORAL | 1 refills | Status: DC

## 2021-09-10 MED ORDER — VALSARTAN 80 MG PO TABS: 80.0000 mg | ORAL_TABLET | Freq: Every day | ORAL | 1 refills | Status: DC

## 2021-09-10 MED ORDER — TRAMADOL HCL 50 MG PO TABS: 50.0000 mg | ORAL_TABLET | Freq: Four times a day (QID) | ORAL | 1 refills | Status: DC | PRN

## 2021-09-10 MED ORDER — ZOLPIDEM TARTRATE 5 MG PO TABS: ORAL_TABLET | ORAL | 1 refills | Status: DC

## 2021-09-10 MED ORDER — ALLOPURINOL 300 MG PO TABS: 300.0000 mg | ORAL_TABLET | Freq: Every day | ORAL | 1 refills | Status: DC

## 2021-09-10 MED ORDER — PRAVASTATIN SODIUM 20 MG PO TABS: 20.0000 mg | ORAL_TABLET | Freq: Every day | ORAL | 1 refills | Status: DC

## 2021-09-10 MED ORDER — SILDENAFIL CITRATE 20 MG PO TABS: 80.0000 mg | ORAL_TABLET | Freq: Every day | ORAL | 0 refills | Status: DC | PRN

## 2021-09-10 MED ORDER — SPIRONOLACTONE 25 MG PO TABS: 25.0000 mg | ORAL_TABLET | Freq: Every day | ORAL | 1 refills | Status: DC

## 2021-09-14 ENCOUNTER — Telehealth: Payer: Self-pay | Admitting: Orthopedic Surgery

## 2021-09-14 NOTE — Telephone Encounter (Signed)
[name] states that Mr. John Hogan is progressing very well.  They are going to skip 1 P.T. visit this week and skip one next week.  Patient will be discharged next week.  Please call with any questions.  [phone]. ?

## 2021-09-14 NOTE — Telephone Encounter (Signed)
Is this ok?

## 2021-09-18 ENCOUNTER — Other Ambulatory Visit: Payer: Self-pay | Admitting: Internal Medicine

## 2021-09-18 DIAGNOSIS — F5104 Psychophysiologic insomnia: Secondary | ICD-10-CM

## 2021-09-21 ENCOUNTER — Telehealth: Payer: Self-pay | Admitting: Cardiology

## 2021-09-21 NOTE — Telephone Encounter (Signed)
Called patient to discuss-patient reports he had just finished walking 1.5 miles prior to him checking his HR.   He reports since he had hip surgery he has been trying to exercise.  He is asymptomatic and HR returns to normal with rest.   Advised to continue to monitor and call back with any questions/concerns.   ? ?

## 2021-09-21 NOTE — Telephone Encounter (Signed)
STAT if HR is under 50 or over 120 ?(normal HR is 60-100 beats per minute) ? ?What is your heart rate?  ? ?Do you have a log of your heart rate readings (document readings)?  ?3/28: 110 (at rest)  ? ?Do you have any other symptoms?  ?[name], PT with [facility] states patient's HR normally ranges in the upper 50's to lower 60's, but when he checked it today it was 110 at rest. Patient is asymptomatic. Per [name], this is just an FYI, but we can contact him or the patient if any recommendations. ? ?

## 2021-09-25 ENCOUNTER — Other Ambulatory Visit: Payer: Self-pay | Admitting: Internal Medicine

## 2021-09-25 DIAGNOSIS — M1000 Idiopathic gout, unspecified site: Secondary | ICD-10-CM

## 2021-10-07 ENCOUNTER — Encounter: Payer: Self-pay | Admitting: Physician Assistant

## 2021-10-07 ENCOUNTER — Ambulatory Visit (INDEPENDENT_AMBULATORY_CARE_PROVIDER_SITE_OTHER): Payer: 59 | Admitting: Physician Assistant

## 2021-10-07 DIAGNOSIS — Z96641 Presence of right artificial hip joint: Secondary | ICD-10-CM

## 2021-10-07 NOTE — Progress Notes (Signed)
HPI: Mr. Wilsey returns today status post right total hip arthroplasty 08/27/2021.  He states he is overall doing well.  He has no concerns.  He is taking no pain medication.  He has 0 out of 10 pain in regards to the hip to some stiffness whenever he first begins to ambulate.  He ambulates without any assistive devices. ? ?Review of systems: See HPI otherwise negative or noncontributory. ? ?Physical exam: General well-developed well-nourished male no acute distress. ?Right hip surgical incisions healing well.  Fluid motion of the right hip without pain.  Right calf supple nontender.  Dorsiflexion plantarflexion right ankle intact.  Ambulates with a nonantalgic gait without any assistive device. ? ?Impression: Status post right total hip arthroplasty 08/27/2021 ? 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  We will continue work on scar tissue mobilization.  Questions were encouraged and answered at length today. ? ? ?

## 2021-10-14 ENCOUNTER — Other Ambulatory Visit: Payer: Self-pay | Admitting: Internal Medicine

## 2021-10-14 DIAGNOSIS — I10 Essential (primary) hypertension: Secondary | ICD-10-CM

## 2021-10-14 DIAGNOSIS — I5042 Chronic combined systolic (congestive) and diastolic (congestive) heart failure: Secondary | ICD-10-CM

## 2021-10-14 DIAGNOSIS — E785 Hyperlipidemia, unspecified: Secondary | ICD-10-CM

## 2021-10-18 ENCOUNTER — Telehealth: Payer: Self-pay | Admitting: Orthopaedic Surgery

## 2021-10-18 NOTE — Telephone Encounter (Signed)
Called and gave verbal ok for refill  ?

## 2021-10-18 NOTE — Telephone Encounter (Signed)
Please call the pharmacy they are questioning the Methocarbol and the frequency  ? ?[phone] ?

## 2021-10-18 NOTE — Telephone Encounter (Signed)
Called pharmacy. They were calling for a refill request. Please advise if ok to refill  ?

## 2021-10-20 ENCOUNTER — Other Ambulatory Visit: Payer: Self-pay | Admitting: Physician Assistant

## 2021-10-20 ENCOUNTER — Telehealth: Payer: Self-pay

## 2021-10-20 MED ORDER — METHOCARBAMOL 500 MG PO TABS: 500.0000 mg | ORAL_TABLET | Freq: Three times a day (TID) | ORAL | 1 refills | Status: DC

## 2021-10-20 NOTE — Telephone Encounter (Signed)
Refill request for methocarbamol 500 ?[facility]  ?

## 2021-10-22 ENCOUNTER — Ambulatory Visit: Payer: 59 | Admitting: Cardiology

## 2021-10-22 ENCOUNTER — Ambulatory Visit (INDEPENDENT_AMBULATORY_CARE_PROVIDER_SITE_OTHER): Payer: 59 | Admitting: Cardiology

## 2021-10-22 ENCOUNTER — Encounter: Payer: Self-pay | Admitting: Cardiology

## 2021-10-22 VITALS — BP 110/64 | HR 59 | Ht 71.0 in | Wt 219.0 lb

## 2021-10-22 DIAGNOSIS — E785 Hyperlipidemia, unspecified: Secondary | ICD-10-CM | POA: Diagnosis not present

## 2021-10-22 DIAGNOSIS — I351 Nonrheumatic aortic (valve) insufficiency: Secondary | ICD-10-CM

## 2021-10-22 DIAGNOSIS — N184 Chronic kidney disease, stage 4 (severe): Secondary | ICD-10-CM

## 2021-10-22 NOTE — Patient Instructions (Signed)
Medication Instructions:  ?Your Physician recommend you continue on your current medication as directed.   ? ?*If you need a refill on your cardiac medications before your next appointment, please call your pharmacy* ? ? ?Lab Work: ?None ordered today ? ? ?Testing/Procedures: ?None ordered today ? ?Follow-Up: ?At [facility], you and your health needs are our priority.  As part of our continuing mission to provide you with exceptional heart care, we have created designated Provider Care Teams.  These Care Teams include your primary Cardiologist (physician) and Advanced Practice Providers (APPs -  Physician Assistants and Nurse Practitioners) who all work together to provide you with the care you need, when you need it. ? ?We recommend signing up for the patient portal called "MyChart".  Sign up information is provided on this After Visit Summary.  MyChart is used to connect with patients for Virtual Visits (Telemedicine).  Patients are able to view lab/test results, encounter notes, upcoming appointments, etc.  Non-urgent messages can be sent to your provider as well.   ?To learn more about what you can do with MyChart, go to [email or web address].   ? ?Your next appointment:   ?1 year(s) ? ?The format for your next appointment:   ?In Person ? ?Provider:   ?James Hochrein, MD { ? ?Important Information About [place] ? ? ? ? ? ? ?

## 2021-10-22 NOTE — Progress Notes (Signed)
?  ?Cardiology Office Note ? ? ?Date:  10/22/2021  ? ?ID:  John Mayer., DOB 12-13-1950, MRN [id number] ? ?PCP:  Janith Lima, MD  ?Cardiologist:   Minus Breeding, MD ? ? ?Chief Complaint  ?Patient presents with  ? AI  ? ? ?  ?History of Present Illness: ?John Hogan. is a 71 y.o. male who presents for followup of aortic insufficiency. He has had previously reduced ejection fraction but it has been improved at followup 2021.  Since I last saw him he has done well.  He had hip surgery.  He did little physical therapy and is doing walking on his own. The patient denies any new symptoms such as chest discomfort, neck or arm discomfort. There has been no new shortness of breath, PND or orthopnea. There have been no reported palpitations, presyncope or syncope.  ? ? ?Past Medical History:  ?Diagnosis Date  ? AI (aortic insufficiency)   ? Arthritis   ? CHF (congestive heart failure) ([place])   ? Chronic kidney disease   ? Glaucoma   ? Gout   ? H/O exercise stress test   ? Myoview 01/03/12: No scar or ischemia, EF 53%  ? Hyperlipidemia   ? Hypertension   ? Hypertensive cardiomyopathy ([place])   ? EF previously 35%;  echo 12/2012 EF 55%, mild AI, ascending aorta mildly dilated, mild LAE  ? ? ?Past Surgical History:  ?Procedure Laterality Date  ? COLONOSCOPY    ? CYST REMOVAL HAND    ? HERNIA REPAIR    ? Umbilical  ? MASS EXCISION Left 08/04/2020  ? Procedure: EXCISION MASS LEFT PALM;  Surgeon: Daryll Brod, MD;  Location: [facility];  Service: Orthopedics;  Laterality: Left;  ? POLYPECTOMY    ? TOTAL HIP ARTHROPLASTY Right 08/27/2021  ? Procedure: Right HIP ARTHROPLASTY ANTERIOR APPROACH;  Surgeon: Mcarthur Rossetti, MD;  Location: [facility];  Service: Orthopedics;  Laterality: Right;  ? WISDOM TOOTH EXTRACTION    ? WRIST SURGERY    ? right  ? ? ? ?Current Outpatient Medications  ?Medication Sig Dispense Refill  ? allopurinol (ZYLOPRIM) 300 MG tablet TAKE 1 TABLET(300 MG) BY MOUTH DAILY 90 tablet 1  ?  benzonatate (TESSALON) 200 MG capsule Take 1 capsule (200 mg total) by mouth 3 (three) times daily as needed. 60 capsule 0  ? Cholecalciferol (VITAMIN D3) 50 MCG (2000 UT) capsule TAKE 1 CAPSULE BY MOUTH EVERY DAY 90 capsule 1  ? colchicine 0.6 MG tablet Take 1 tablet (0.6 mg total) by mouth daily. When having flare (Patient taking differently: Take 0.6 mg by mouth daily as needed. When having flare) 5 tablet 2  ? methocarbamol (ROBAXIN) 500 MG tablet Take 1 tablet (500 mg total) by mouth 3 (three) times daily. 40 tablet 1  ? pravastatin (PRAVACHOL) 20 MG tablet TAKE 1 TABLET BY MOUTH EVERY DAY 90 tablet 1  ? sildenafil (REVATIO) 20 MG tablet Take 4 tablets (80 mg total) by mouth daily as needed. 360 tablet 0  ? SIMBRINZA 1-0.2 % SUSP Place 1 drop into both eyes in the morning and at bedtime.  3  ? spironolactone (ALDACTONE) 25 MG tablet Take 1 tablet (25 mg total) by mouth daily. 90 tablet 1  ? timolol (TIMOPTIC) 0.5 % ophthalmic solution Place 1 drop into both eyes 2 (two) times daily.  3  ? torsemide (DEMADEX) 20 MG tablet TAKE 1 TABLET BY MOUTH EVERY DAY 90 tablet 1  ? Travoprost, BAK Free, (  TRAVATAN) 0.004 % SOLN ophthalmic solution Place 1 drop into both eyes at bedtime.     ? triamcinolone cream (KENALOG) 0.1 % APPLY TO AFFECTED AREA TWICE A DAY 30 g 1  ? valsartan (DIOVAN) 80 MG tablet Take 1 tablet (80 mg total) by mouth daily. 90 tablet 1  ? zolpidem (AMBIEN) 5 MG tablet TAKE 1 TABLET(5 MG) BY MOUTH AT BEDTIME AS NEEDED FOR SLEEP 90 tablet 0  ? ?No current facility-administered medications for this visit.  ? ? ?Allergies:   Patient has no known allergies.  ? ?ROS:  Please see the history of present illness.   Otherwise, review of systems are positive for none.   All other systems are reviewed and negative.  ? ? ?PHYSICAL EXAM: ?VS:  BP 110/64 (BP Location: Left Arm, Patient Position: Sitting, Cuff Size: Normal)   Pulse (!) 59   Ht '5\' 11"'$  (1.803 m)   Wt 219 lb (99.3 kg)   BMI 30.54 kg/m?  , BMI Body  mass index is 30.54 kg/m?. ?GENERAL:  Well appearing ?NECK:  No jugular venous distention, waveform within normal limits, carotid upstroke brisk and symmetric, no bruits, no thyromegaly ?LUNGS:  Clear to auscultation bilaterally ?CHEST:  Unremarkable ?HEART:  PMI not displaced or sustained,S1 and S2 within normal limits, no S3, no S4, no clicks, no rubs, no murmurs ?ABD:  Flat, positive bowel sounds normal in frequency in pitch, no bruits, no rebound, no guarding, no midline pulsatile mass, no hepatomegaly, no splenomegaly ?EXT:  2 plus pulses throughout, no edema, no cyanosis no clubbing ? ? ?EKG:  EKG is not ordered today. ? ? ?Recent Labs: ?02/22/2021: ALT 10; TSH 2.25 ?08/28/2021: BUN 28; Creatinine, Ser 2.07; Hemoglobin 13.9; Platelets 184; Potassium 3.8; Sodium 134  ? ? ?Lipid Panel ?   ?Component Value Date/Time  ? CHOL 161 02/22/2021 1128  ? TRIG 92.0 02/22/2021 1128  ? HDL 55.60 02/22/2021 1128  ? CHOLHDL 3 02/22/2021 1128  ? VLDL 18.4 02/22/2021 1128  ? [place] 87 02/22/2021 1128  ? LDLDIRECT 140.7 02/27/2013 1405  ? ?  ? ?Wt Readings from Last 3 Encounters:  ?10/22/21 219 lb (99.3 kg)  ?08/27/21 223 lb (101.2 kg)  ?08/20/21 223 lb (101.2 kg)  ?  ? ? ?Other studies Reviewed: ?Additional studies/ records that were reviewed today include: Labs. ?Review of the above records demonstrates:  Please see elsewhere in the note.   ? ? ?ASSESSMENT AND PLAN: ? ?  ?Aortic insufficiency - ?This was mild to moderate 2021.  He has no symptoms related to this.  I will follow this clinically.  No repeat echocardiography is needed at this time. ? ?HTN - ?The blood pressure is controlled.  He will continue the meds as listed. ? ?Dyslipidemia -  ?LDL was 87 with an HDL of 55.  No change in therapy.  ? ?CKD -   ?Creatinine was 2.07 which is essentially unchanged since 2018.  This lab was from March.  I would suggest checking this a couple times a year.  He and I discussed this. ? ?Current medicines are reviewed at length with the  patient today.  The patient does not have concerns regarding medicines. ? ?The following changes have been made: None ? ?Labs/ tests ordered today include: None ? ?No orders of the defined types were placed in this encounter. ? ? ? ?Disposition:   FU with me in 1 year.   ? ? ?Signed, ?Minus Breeding, MD  ?10/22/2021 1:40 PM    ?  [place] ? ? ?

## 2021-10-25 ENCOUNTER — Other Ambulatory Visit: Payer: Self-pay | Admitting: Internal Medicine

## 2021-10-25 DIAGNOSIS — I10 Essential (primary) hypertension: Secondary | ICD-10-CM

## 2021-10-25 DIAGNOSIS — I5042 Chronic combined systolic (congestive) and diastolic (congestive) heart failure: Secondary | ICD-10-CM

## 2021-11-04 ENCOUNTER — Ambulatory Visit (INDEPENDENT_AMBULATORY_CARE_PROVIDER_SITE_OTHER): Payer: 59 | Admitting: Orthopaedic Surgery

## 2021-11-04 ENCOUNTER — Encounter: Payer: Self-pay | Admitting: Orthopaedic Surgery

## 2021-11-04 DIAGNOSIS — Z96641 Presence of right artificial hip joint: Secondary | ICD-10-CM

## 2021-11-04 NOTE — Progress Notes (Signed)
The patient is a 71 year old gentleman who is 10 weeks status post a right total hip arthroplasty.  He says he is doing well overall he reports good range of motion and strength.  He says his disability does and on the 25th of this month and he feels like he can return to full work duty starting the 26.  I agree with that as well.  He is walking without an assistive device either. ? ?His right operative hip moves smoothly and fluidly with no blocks to rotation.  He has good flexion as well.  He does not walk with any significant limp. ? ?I will see him back in 6 months to see how he is doing overall.  All questions and concerns were answered and addressed.  If there are issues before 6 months he knows to come and see [place].  At his next visit I would like a standing AP pelvis and lateral of his right operative hip. ?

## 2021-11-23 ENCOUNTER — Other Ambulatory Visit: Payer: Self-pay

## 2021-11-23 MED ORDER — METHOCARBAMOL 500 MG PO TABS: 500.0000 mg | ORAL_TABLET | Freq: Three times a day (TID) | ORAL | 1 refills | Status: DC

## 2021-12-17 ENCOUNTER — Other Ambulatory Visit: Payer: Self-pay | Admitting: Internal Medicine

## 2021-12-17 DIAGNOSIS — M159 Polyosteoarthritis, unspecified: Secondary | ICD-10-CM

## 2021-12-19 ENCOUNTER — Other Ambulatory Visit: Payer: Self-pay | Admitting: Internal Medicine

## 2021-12-20 ENCOUNTER — Encounter: Payer: Self-pay | Admitting: Internal Medicine

## 2021-12-20 ENCOUNTER — Telehealth: Payer: Self-pay

## 2021-12-20 MED ORDER — METHOCARBAMOL 500 MG PO TABS: 500.0000 mg | ORAL_TABLET | Freq: Three times a day (TID) | ORAL | 1 refills | Status: DC

## 2021-12-21 ENCOUNTER — Telehealth: Payer: Self-pay | Admitting: *Deleted

## 2021-12-22 NOTE — Telephone Encounter (Signed)
Call number on card spoke w/ rep he states pt doesn't have active plan. Case ID # [id number].Marland KitchenJohny Hogan

## 2022-01-21 ENCOUNTER — Other Ambulatory Visit: Payer: Self-pay | Admitting: Internal Medicine

## 2022-01-21 DIAGNOSIS — M159 Polyosteoarthritis, unspecified: Secondary | ICD-10-CM

## 2022-01-23 ENCOUNTER — Ambulatory Visit
Admission: EM | Admit: 2022-01-23 | Discharge: 2022-01-23 | Disposition: A | Discharge status: Home / Self Care | Payer: BC Managed Care – PPO | Attending: Physician Assistant | Admitting: Physician Assistant

## 2022-01-23 DIAGNOSIS — M109 Gout, unspecified: Secondary | ICD-10-CM

## 2022-01-23 MED ORDER — PREDNISONE 10 MG (21) PO TBPK: ORAL_TABLET | Freq: Every day | ORAL | 0 refills | Status: DC

## 2022-01-23 NOTE — Discharge Instructions (Signed)
Start prednisone taper as directed Can apply ice Can take Tylenol Avoid NSAIDS like ibuprofen or advil Follow up with primary care physician if no improvement

## 2022-01-23 NOTE — ED Triage Notes (Signed)
[facility] to [facility] with [facility] of right sided wrist pain for last couple days. Pt reports pmh gout and concern for gout flair up. Pt reports allopurinol and no otc pain medications 10/10 pain when he moves it and swelling noted to sight\

## 2022-01-23 NOTE — ED Provider Notes (Signed)
[facility]    CSN: [id number] Arrival date & time: 01/23/22  2878      History   Chief Complaint Chief Complaint  Patient presents with   Wrist Pain    HPI John Hogan. is a 71 y.o. male.   Pt complains of right wrist pain that started several days ago. Denies injury or trauma.  He has a h/o gout and reports today's sx feel similar to previous gout flares.  He takes allopurinol daily.  He reports pain is worse with movement.      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   Unilateral primary osteoarthritis, right hip 08/26/2021   / 07/23/2021   Chronic pain of right hip 02/22/2021   Flu vaccine need 02/22/2021   Grade I diastolic dysfunction 67/67/2094   Bradycardia 10/06/2019   Vitamin D deficiency 09/06/2017   CKD (chronic kidney disease) stage 3, GFR 30-59 ml/min (HCC) 08/07/2014   Insomnia 02/14/2014   Erectile dysfunction 02/27/2013   Papular atopic dermatitis 05/16/2012   Gout 02/16/2012   Routine general medical examination at a health care facility 02/16/2012   Hyperlipidemia with target LDL less than 100 02/16/2012   Aortic regurgitation 02/16/2012   Colon polyps 02/16/2012   Essential hypertension, benign 03/07/2009   Hypertensive heart disease without heart failure 03/07/2009   COMBINED HEART FAILURE, CHRONIC 03/07/2009    Past Surgical History:  Procedure Laterality Date   COLONOSCOPY     CYST REMOVAL HAND     HERNIA REPAIR     Umbilical   MASS EXCISION  Left 08/04/2020   Procedure: EXCISION MASS LEFT PALM;  Surgeon: Daryll Brod, MD;  Location: [place];  Service: Orthopedics;  Laterality: Left;   POLYPECTOMY     TOTAL HIP ARTHROPLASTY Right 08/27/2021   Procedure: Right HIP ARTHROPLASTY ANTERIOR APPROACH;  Surgeon: Mcarthur Rossetti, MD;  Location: [facility];  Service: Orthopedics;  Laterality: Right;   WISDOM TOOTH EXTRACTION     WRIST SURGERY     right       Home Medications    Prior to Admission medications   Medication Sig Start Date End Date Taking? Authorizing Provider  predniSONE (STERAPRED UNI-PAK 21 TAB) 10 MG (21) TBPK tablet Take by mouth daily. Take 6 tabs by mouth daily  for 2 days, then 5 tabs for 2 days, then 4 tabs for 2 days, then 3 tabs for 2 days, 2 tabs for 2 days, then 1 tab by mouth daily for 2 days 01/23/22  Yes Ward, Lenise Arena, PA-C  allopurinol (ZYLOPRIM) 300 MG tablet TAKE 1 TABLET(300 MG) BY MOUTH DAILY 09/25/21   Janith Lima, MD  benzonatate (TESSALON) 200 MG capsule Take 1 capsule (200 mg total) by mouth 3 (three) times daily as needed. 07/23/21   Hoyt Koch, MD  Cholecalciferol (VITAMIN D3) 50 MCG (2000 UT) capsule TAKE 1 CAPSULE BY  MOUTH EVERY DAY 01/03/21   Janith Lima, MD  colchicine 0.6 MG tablet Take 1 tablet (0.6 mg total) by mouth daily. When having flare Patient taking differently: Take 0.6 mg by mouth daily as needed. When having flare 02/15/21   Janith Lima, MD  methocarbamol (ROBAXIN) 500 MG tablet Take 1 tablet (500 mg total) by mouth 3 (three) times daily. 12/20/21   Pete Pelt, PA-C  pravastatin (PRAVACHOL) 20 MG tablet TAKE 1 TABLET BY MOUTH EVERY DAY 10/14/21   Janith Lima, MD  sildenafil (REVATIO) 20 MG tablet Take 4 tablets (80 mg total) by mouth daily as needed. 09/10/21   Janith Lima, MD  SIMBRINZA 1-0.2 % SUSP Place 1 drop into both eyes in the morning and at bedtime. 01/08/18   [provider]  spironolactone (ALDACTONE) 25 MG tablet  TAKE 1 TABLET(25 MG) BY MOUTH DAILY 10/25/21   Janith Lima, MD  timolol (TIMOPTIC) 0.5 % ophthalmic solution Place 1 drop into both eyes 2 (two) times daily. 01/04/18   [provider]  torsemide (DEMADEX) 20 MG tablet TAKE 1 TABLET BY MOUTH EVERY DAY 10/14/21   Janith Lima, MD  Travoprost, BAK Free, (TRAVATAN) 0.004 % SOLN ophthalmic solution Place 1 drop into both eyes at bedtime.     [provider]  triamcinolone cream (KENALOG) 0.1 % APPLY TO AFFECTED AREA TWICE A DAY 01/03/21   Janith Lima, MD  valsartan (DIOVAN) 80 MG tablet TAKE 1 TABLET(80 MG) BY MOUTH DAILY 10/25/21   Janith Lima, MD  zolpidem (AMBIEN) 5 MG tablet TAKE 1 TABLET(5 MG) BY MOUTH AT BEDTIME AS NEEDED FOR SLEEP 09/19/21   Janith Lima, MD    Family History Family History  Problem Relation Age of Onset   Hypertension Father    Colon cancer Father 85   Hyperlipidemia Father    Heart disease Father    Diabetes Father    Stroke Mother        deceased   Colon cancer Mother 47   Arthritis Mother    Hyperlipidemia Mother    Hypertension Mother    Diabetes Mother    Colon polyps Sister    Colon polyps Brother    COPD Neg Hx    Alcohol abuse Neg Hx    Kidney disease Neg Hx    Esophageal cancer Neg Hx    Rectal cancer Neg Hx    Stomach cancer Neg Hx     Social History Social History   Tobacco Use   Smoking status: Former    Packs/day: 1.00    Years: 2.00    Total pack years: 2.00    Types: Cigarettes    Quit date: 01/10/1975    Years since quitting: 47.0   Smokeless tobacco: Never   Tobacco comments:    Only smoked two years  Vaping Use   Vaping Use: Never used  Substance Use Topics   Alcohol use: No   Drug use: No     Allergies   Patient has no known allergies.   Review of Systems Review of Systems  Constitutional:  Negative for chills and fever.  HENT:  Negative for ear pain and sore throat.   Eyes:  Negative for pain and visual disturbance.  Respiratory:   Negative for cough and shortness of breath.   Cardiovascular:  Negative for chest pain and palpitations.  Gastrointestinal:  Negative for abdominal pain and vomiting.  Genitourinary:  Negative for dysuria and  hematuria.  Musculoskeletal:  Positive for arthralgias (right wrist pain). Negative for back pain.  Skin:  Negative for color change and rash.  Neurological:  Negative for seizures and syncope.  All other systems reviewed and are negative.    Physical Exam Triage Vital Signs ED Triage Vitals  Enc Vitals Group     BP 01/23/22 0845 114/71     Pulse Rate 01/23/22 0845 (!) 107     Resp 01/23/22 0845 18     Temp 01/23/22 0845 (!) 97.4 F (36.3 C)     Temp Source 01/23/22 0845 Oral     SpO2 01/23/22 0845 97 %     Weight --      Height --      Head Circumference --      Peak Flow --      Pain Score 01/23/22 0844 10     Pain Loc --      Pain Edu? --      Excl. in [place]? --    No data found.  Updated Vital Signs BP 114/71   Pulse (!) 107   Temp (!) 97.4 F (36.3 C) (Oral)   Resp 18   SpO2 97%   Visual Acuity Right Eye Distance:   Left Eye Distance:   Bilateral Distance:    Right Eye Near:   Left Eye Near:    Bilateral Near:     Physical Exam Vitals and nursing note reviewed.  Constitutional:      General: He is not in acute distress.    Appearance: He is well-developed.  HENT:     Head: Normocephalic and atraumatic.  Eyes:     Conjunctiva/sclera: Conjunctivae normal.  Cardiovascular:     Rate and Rhythm: Normal rate and regular rhythm.     Heart sounds: No murmur heard. Pulmonary:     Effort: Pulmonary effort is normal. No respiratory distress.     Breath sounds: Normal breath sounds.  Abdominal:     Palpations: Abdomen is soft.     Tenderness: There is no abdominal tenderness.  Musculoskeletal:        General: No swelling.     Cervical back: Neck supple.     Comments: Swelling, redness to medial wrist with tenderness to light palpation.  Decreased ROM  due to pain.   Skin:    General: Skin is warm and dry.     Capillary Refill: Capillary refill takes less than 2 seconds.  Neurological:     Mental Status: He is alert.  Psychiatric:        Mood and Affect: Mood normal.      UC Treatments / Results  Labs (all labs ordered are listed, but only abnormal results are displayed) Labs Reviewed - No data to display  EKG   Radiology No results found.  Procedures Procedures (including critical care time)  Medications Ordered in UC Medications - No data to display  Initial Impression / Assessment and Plan / UC Course  I have reviewed the triage vital signs and the nursing notes.  Pertinent labs & imaging results that were available during my care of the patient were reviewed by me and considered in my medical decision making (see chart for details).     Gout flare, prednisone prescribed.  Advised to avoid NSAIDS due to kidney function and while taking prednisone.  Advised follow up with primary care physician.  Final Clinical Impressions(s) / UC Diagnoses   Final diagnoses:  Acute gout of right wrist,  unspecified cause     Discharge Instructions      Start prednisone taper as directed Can apply ice Can take Tylenol Avoid NSAIDS like ibuprofen or advil Follow up with primary care physician if no improvement    ED Prescriptions     Medication Sig Dispense Auth. Provider   predniSONE (STERAPRED UNI-PAK 21 TAB) 10 MG (21) TBPK tablet Take by mouth daily. Take 6 tabs by mouth daily  for 2 days, then 5 tabs for 2 days, then 4 tabs for 2 days, then 3 tabs for 2 days, 2 tabs for 2 days, then 1 tab by mouth daily for 2 days 42 tablet Ward, Lenise Arena, PA-C      PDMP not reviewed this encounter.   Ward, Lenise Arena, PA-C 01/23/22 [phone]

## 2022-03-07 ENCOUNTER — Encounter: Payer: Self-pay | Admitting: Internal Medicine

## 2022-03-07 ENCOUNTER — Ambulatory Visit (INDEPENDENT_AMBULATORY_CARE_PROVIDER_SITE_OTHER): Payer: BC Managed Care – PPO | Admitting: Internal Medicine

## 2022-03-07 VITALS — BP 118/72 | HR 80 | Temp 97.5°F | Ht 71.0 in | Wt 220.0 lb

## 2022-03-07 DIAGNOSIS — I5042 Chronic combined systolic (congestive) and diastolic (congestive) heart failure: Secondary | ICD-10-CM

## 2022-03-07 DIAGNOSIS — M1000 Idiopathic gout, unspecified site: Secondary | ICD-10-CM

## 2022-03-07 DIAGNOSIS — Z0000 Encounter for general adult medical examination without abnormal findings: Secondary | ICD-10-CM

## 2022-03-07 DIAGNOSIS — R739 Hyperglycemia, unspecified: Secondary | ICD-10-CM

## 2022-03-07 DIAGNOSIS — Z23 Encounter for immunization: Secondary | ICD-10-CM | POA: Diagnosis not present

## 2022-03-07 DIAGNOSIS — I10 Essential (primary) hypertension: Secondary | ICD-10-CM | POA: Diagnosis not present

## 2022-03-07 DIAGNOSIS — N1831 Chronic kidney disease, stage 3a: Secondary | ICD-10-CM

## 2022-03-07 DIAGNOSIS — E785 Hyperlipidemia, unspecified: Secondary | ICD-10-CM | POA: Diagnosis not present

## 2022-03-07 DIAGNOSIS — F5104 Psychophysiologic insomnia: Secondary | ICD-10-CM

## 2022-03-07 DIAGNOSIS — E1121 Type 2 diabetes mellitus with diabetic nephropathy: Secondary | ICD-10-CM

## 2022-03-07 DIAGNOSIS — N1832 Chronic kidney disease, stage 3b: Secondary | ICD-10-CM

## 2022-03-07 DIAGNOSIS — N400 Enlarged prostate without lower urinary tract symptoms: Secondary | ICD-10-CM

## 2022-03-07 DIAGNOSIS — N522 Drug-induced erectile dysfunction: Secondary | ICD-10-CM

## 2022-03-07 LAB — LIPID PANEL
Cholesterol: 183 mg/dL (ref 0–200)
HDL: 52.1 mg/dL (ref >39.00)
LDL Cholesterol: 113 mg/dL — ABNORMAL HIGH (ref 0–99)
NonHDL: 131.18
Total CHOL/HDL Ratio: 4
Triglycerides: 93 mg/dL (ref 0.0–149.0)
VLDL: 18.6 mg/dL (ref 0.0–40.0)

## 2022-03-07 LAB — URINALYSIS, ROUTINE W REFLEX MICROSCOPIC
Bilirubin Urine: NEGATIVE
Hgb urine dipstick: NEGATIVE
Ketones, ur: NEGATIVE
Leukocytes,Ua: NEGATIVE
Nitrite: NEGATIVE
RBC / HPF: NONE SEEN (ref 0–?)
Specific Gravity, Urine: 1.015 (ref 1.000–1.030)
Total Protein, Urine: NEGATIVE
Urine Glucose: NEGATIVE
Urobilinogen, UA: 0.2 (ref 0.0–1.0)
pH: 6 (ref 5.0–8.0)

## 2022-03-07 LAB — HEPATIC FUNCTION PANEL
ALT: 10 U/L (ref 0–53)
AST: 13 U/L (ref 0–37)
Albumin: 4 g/dL (ref 3.5–5.2)
Alkaline Phosphatase: 94 U/L (ref 39–117)
Bilirubin, Direct: 0.2 mg/dL (ref 0.0–0.3)
Total Bilirubin: 0.8 mg/dL (ref 0.2–1.2)
Total Protein: 7.2 g/dL (ref 6.0–8.3)

## 2022-03-07 LAB — CBC WITH DIFFERENTIAL/PLATELET
Basophils Absolute: 0 10*3/uL (ref 0.0–0.1)
Basophils Relative: 0.4 % (ref 0.0–3.0)
Eosinophils Absolute: 0 10*3/uL (ref 0.0–0.7)
Eosinophils Relative: 0.6 % (ref 0.0–5.0)
HCT: 37.7 % — ABNORMAL LOW (ref 39.0–52.0)
Hemoglobin: 12.6 g/dL — ABNORMAL LOW (ref 13.0–17.0)
Lymphocytes Relative: 17.6 % (ref 12.0–46.0)
Lymphs Abs: 0.9 10*3/uL (ref 0.7–4.0)
MCHC: 33.5 g/dL (ref 30.0–36.0)
MCV: 95.4 fl (ref 78.0–100.0)
Monocytes Absolute: 0.1 10*3/uL (ref 0.1–1.0)
Monocytes Relative: 2.4 % — ABNORMAL LOW (ref 3.0–12.0)
Neutro Abs: 3.8 10*3/uL (ref 1.4–7.7)
Neutrophils Relative %: 79 % — ABNORMAL HIGH (ref 43.0–77.0)
Platelets: 183 10*3/uL (ref 150.0–400.0)
RBC: 3.95 Mil/uL — ABNORMAL LOW (ref 4.22–5.81)
RDW: 15.1 % (ref 11.5–15.5)
WBC: 4.8 10*3/uL (ref 4.0–10.5)

## 2022-03-07 LAB — PSA: PSA: 2.57 ng/mL (ref 0.10–4.00)

## 2022-03-07 LAB — BASIC METABOLIC PANEL
BUN: 41 mg/dL — ABNORMAL HIGH (ref 6–23)
CO2: 25 mEq/L (ref 19–32)
Calcium: 9.1 mg/dL (ref 8.4–10.5)
Chloride: 101 mEq/L (ref 96–112)
Creatinine, Ser: 2.6 mg/dL — ABNORMAL HIGH (ref 0.40–1.50)
GFR: 23.91 mL/min — ABNORMAL LOW (ref >60.00)
Glucose, Bld: 168 mg/dL — ABNORMAL HIGH (ref 70–99)
Potassium: 4.3 mEq/L (ref 3.5–5.1)
Sodium: 136 mEq/L (ref 135–145)

## 2022-03-07 LAB — URIC ACID: Uric Acid, Serum: 5.2 mg/dL (ref 4.0–7.8)

## 2022-03-07 LAB — HEMOGLOBIN A1C: Hgb A1c MFr Bld: 6.9 % — ABNORMAL HIGH (ref 4.6–6.5)

## 2022-03-07 MED ORDER — SILDENAFIL CITRATE 20 MG PO TABS: 80.0000 mg | ORAL_TABLET | Freq: Every day | ORAL | 0 refills | Status: DC | PRN

## 2022-03-07 MED ORDER — ENTRESTO 24-26 MG PO TABS: 1.0000 | ORAL_TABLET | Freq: Two times a day (BID) | ORAL | 0 refills | Status: DC

## 2022-03-07 NOTE — Progress Notes (Signed)
Subjective:  Patient ID: John Hogan., male    DOB: September 04, 1950  Age: 71 y.o. MRN: [id number]  CC: Annual Exam, Hyperlipidemia, and Hypertension   HPI John Hogan. presents for a CPX and f/up -   His previous insurance would not pay for Entresto.  He has new insurance and wants to restart Entresto.  He walks about 3 miles per day and does not experience chest pain, shortness of breath, diaphoresis, or edema.   Outpatient Medications Prior to Visit  Medication Sig Dispense Refill   Cholecalciferol (VITAMIN D3) 50 MCG (2000 UT) capsule TAKE 1 CAPSULE BY MOUTH EVERY DAY 90 capsule 1   colchicine 0.6 MG tablet Take 1 tablet (0.6 mg total) by mouth daily. When having flare (Patient taking differently: Take 0.6 mg by mouth daily as needed. When having flare) 5 tablet 2   SIMBRINZA 1-0.2 % SUSP Place 1 drop into both eyes in the morning and at bedtime.  3   spironolactone (ALDACTONE) 25 MG tablet TAKE 1 TABLET(25 MG) BY MOUTH DAILY 90 tablet 1   timolol (TIMOPTIC) 0.5 % ophthalmic solution Place 1 drop into both eyes 2 (two) times daily.  3   torsemide (DEMADEX) 20 MG tablet TAKE 1 TABLET BY MOUTH EVERY DAY 90 tablet 1   Travoprost, BAK Free, (TRAVATAN) 0.004 % SOLN ophthalmic solution Place 1 drop into both eyes at bedtime.      allopurinol (ZYLOPRIM) 300 MG tablet TAKE 1 TABLET(300 MG) BY MOUTH DAILY 90 tablet 1   benzonatate (TESSALON) 200 MG capsule Take 1 capsule (200 mg total) by mouth 3 (three) times daily as needed. 60 capsule 0   methocarbamol (ROBAXIN) 500 MG tablet Take 1 tablet (500 mg total) by mouth 3 (three) times daily. 40 tablet 1   pravastatin (PRAVACHOL) 20 MG tablet TAKE 1 TABLET BY MOUTH EVERY DAY 90 tablet 1   predniSONE (STERAPRED UNI-PAK 21 TAB) 10 MG (21) TBPK tablet Take by mouth daily. Take 6 tabs by mouth daily  for 2 days, then 5 tabs for 2 days, then 4 tabs for 2 days, then 3 tabs for 2 days, 2 tabs for 2 days, then 1 tab by mouth daily for 2 days 42 tablet 0    sildenafil (REVATIO) 20 MG tablet Take 4 tablets (80 mg total) by mouth daily as needed. 360 tablet 0   triamcinolone cream (KENALOG) 0.1 % APPLY TO AFFECTED AREA TWICE A DAY 30 g 1   valsartan (DIOVAN) 80 MG tablet TAKE 1 TABLET(80 MG) BY MOUTH DAILY 90 tablet 1   zolpidem (AMBIEN) 5 MG tablet TAKE 1 TABLET(5 MG) BY MOUTH AT BEDTIME AS NEEDED FOR SLEEP 90 tablet 0   No facility-administered medications prior to visit.    ROS Review of Systems  Constitutional:  Negative for diaphoresis and fatigue.  HENT: Negative.    Eyes: Negative.   Respiratory:  Negative for cough, chest tightness, shortness of breath and wheezing.   Cardiovascular:  Negative for chest pain, palpitations and leg swelling.  Gastrointestinal:  Negative for abdominal pain, diarrhea and nausea.  Endocrine: Negative.   Genitourinary: Negative.  Negative for difficulty urinating.  Musculoskeletal: Negative.  Negative for arthralgias and myalgias.  Skin: Negative.   Neurological:  Negative for dizziness and weakness.  Hematological:  Negative for adenopathy. Does not bruise/bleed easily.  Psychiatric/Behavioral: Negative.      Objective:  BP 118/72 (BP Location: Left Arm, Patient Position: Sitting, Cuff Size: Large)   Pulse 80  Temp (!) 97.5 F (36.4 C) (Oral)   Ht '5\' 11"'$  (1.803 m)   Wt 220 lb (99.8 kg)   SpO2 95%   BMI 30.68 kg/m   BP Readings from Last 3 Encounters:  03/07/22 118/72  01/23/22 114/71  10/22/21 110/64    Wt Readings from Last 3 Encounters:  03/07/22 220 lb (99.8 kg)  10/22/21 219 lb (99.3 kg)  08/27/21 223 lb (101.2 kg)    Physical Exam Vitals reviewed. Exam conducted with a chaperone present.  HENT:     Mouth/Throat:     Mouth: Mucous membranes are moist.  Eyes:     General: No scleral icterus.    Conjunctiva/sclera: Conjunctivae normal.  Cardiovascular:     Rate and Rhythm: Normal rate and regular rhythm.     Heart sounds: S1 normal and S2 normal. Murmur heard.     No  systolic murmur is present.     Diastolic murmur is present with a grade of 1/4.     No friction rub. No gallop.  Pulmonary:     Effort: Pulmonary effort is normal.     Breath sounds: No stridor. No wheezing, rhonchi or rales.  Abdominal:     General: Abdomen is flat.     Palpations: There is no mass.     Tenderness: There is no abdominal tenderness. There is no guarding or rebound.     Hernia: No hernia is present. There is no hernia in the left inguinal area or right inguinal area.  Genitourinary:    Penis: Normal and uncircumcised.      Testes: Normal.     Epididymis:     Right: Normal.     Left: Normal.     Prostate: Enlarged. Not tender and no nodules present.     Rectum: Normal. Guaiac result negative. No mass, tenderness, anal fissure, external hemorrhoid or internal hemorrhoid. Normal anal tone.  Musculoskeletal:     Cervical back: Neck supple.     Right lower leg: No edema.     Left lower leg: No edema.  Lymphadenopathy:     Lower Body: No right inguinal adenopathy. No left inguinal adenopathy.  Skin:    General: Skin is warm and dry.  Neurological:     General: No focal deficit present.     Mental Status: He is alert.  Psychiatric:        Mood and Affect: Mood normal.        Behavior: Behavior normal.     Lab Results  Component Value Date   WBC 4.8 03/07/2022   HGB 12.6 (L) 03/07/2022   HCT 37.7 (L) 03/07/2022   PLT 183.0 03/07/2022   GLUCOSE 168 (H) 03/07/2022   CHOL 183 03/07/2022   TRIG 93.0 03/07/2022   HDL 52.10 03/07/2022   LDLDIRECT 140.7 02/27/2013   LDLCALC 113 (H) 03/07/2022   ALT 10 03/07/2022   AST 13 03/07/2022   NA 136 03/07/2022   K 4.3 03/07/2022   CL 101 03/07/2022   CREATININE 2.60 (H) 03/07/2022   BUN 41 (H) 03/07/2022   CO2 25 03/07/2022   TSH 2.25 02/22/2021   PSA 2.57 03/07/2022   HGBA1C 6.9 (H) 03/07/2022    No results found.  Assessment & Plan:   Amanuel was seen today for annual exam, hyperlipidemia and  hypertension.  Diagnoses and all orders for this visit:  Essential hypertension, benign- His BP is well controlled. -     Basic metabolic panel; Future -  CBC with Differential/Platelet; Future -     Urinalysis, Routine w reflex microscopic; Future -     Urinalysis, Routine w reflex microscopic -     CBC with Differential/Platelet -     Basic metabolic panel  Stage 3a chronic kidney disease ([place])- his renal fxn has declined. Will decrease the allopurinol dose. -     Basic metabolic panel; Future -     Basic metabolic panel  Idiopathic gout, unspecified chronicity, unspecified site- He has achieved his UA goal. -     Uric acid; Future -     Uric acid  Routine general medical examination at a health care facility- Exam completed, labs reviewed, vaccines updated, cancer screenings are UTD, pt ed material was given.  Hyperlipidemia with target LDL less than 100- LDL goal achieved. Doing well on the statin  -     Lipid panel; Future -     Hepatic function panel; Future -     Hepatic function panel -     Lipid panel  Chronic hyperglycemia -     Hemoglobin A1c; Future -     Hemoglobin A1c  Drug-induced erectile dysfunction -     sildenafil (REVATIO) 20 MG tablet; Take 4 tablets (80 mg total) by mouth daily as needed.  Need for vaccination -     Flu Vaccine QUAD High Dose(Fluad)  Benign prostatic hyperplasia without lower urinary tract symptoms -     PSA; Future -     Urinalysis, Routine w reflex microscopic; Future -     Urinalysis, Routine w reflex microscopic -     PSA  COMBINED HEART FAILURE, CHRONIC -     sacubitril-valsartan (ENTRESTO) 24-26 MG; Take 1 tablet by mouth 2 (two) times daily. -     dapagliflozin propanediol (FARXIGA) 10 MG TABS tablet; Take 1 tablet (10 mg total) by mouth daily before breakfast.  Type II diabetes mellitus with nephropathy (HCC) -     dapagliflozin propanediol (FARXIGA) 10 MG TABS tablet; Take 1 tablet (10 mg total) by mouth daily before  breakfast.  Stage 3b chronic kidney disease ([place]) -     Ambulatory referral to Nephrology  Other orders -     Tdap vaccine greater than or equal to 7yo IM   I have discontinued Donzetta Starch Jr.'s triamcinolone cream, benzonatate, allopurinol, valsartan, methocarbamol, and predniSONE. I have also changed his zolpidem and pravastatin. Additionally, I am having him start on Entresto, dapagliflozin propanediol, and allopurinol. Lastly, I am having him maintain his Travoprost (BAK Free), timolol, Simbrinza, Vitamin D3, colchicine, torsemide, spironolactone, and sildenafil.  Meds ordered this encounter  Medications   sildenafil (REVATIO) 20 MG tablet    Sig: Take 4 tablets (80 mg total) by mouth daily as needed.    Dispense:  360 tablet    Refill:  0   sacubitril-valsartan (ENTRESTO) 24-26 MG    Sig: Take 1 tablet by mouth 2 (two) times daily.    Dispense:  60 tablet    Refill:  0   dapagliflozin propanediol (FARXIGA) 10 MG TABS tablet    Sig: Take 1 tablet (10 mg total) by mouth daily before breakfast.    Dispense:  90 tablet    Refill:  1   zolpidem (AMBIEN) 5 MG tablet    Sig: Take 1 tablet (5 mg total) by mouth at bedtime as needed for sleep.    Dispense:  90 tablet    Refill:  1   pravastatin (PRAVACHOL) 20 MG tablet  Sig: Take 1 tablet (20 mg total) by mouth daily.    Dispense:  90 tablet    Refill:  1   allopurinol (ZYLOPRIM) 100 MG tablet    Sig: Take 2 tablets (200 mg total) by mouth daily.    Dispense:  180 tablet    Refill:  1     Follow-up: Return in about 6 months (around 09/05/2022).  Scarlette Calico, MD

## 2022-03-07 NOTE — Patient Instructions (Signed)
Health Maintenance, Male Adopting a healthy lifestyle and getting preventive care are important in promoting health and wellness. Ask your health care provider about: The right schedule for you to have regular tests and exams. Things you can do on your own to prevent diseases and keep yourself healthy. What should I know about diet, weight, and exercise? Eat a healthy diet  Eat a diet that includes plenty of vegetables, fruits, low-fat dairy products, and lean protein. Do not eat a lot of foods that are high in solid fats, added sugars, or sodium. Maintain a healthy weight Body mass index (BMI) is a measurement that can be used to identify possible weight problems. It estimates body fat based on height and weight. Your health care provider can help determine your BMI and help you achieve or maintain a healthy weight. Get regular exercise Get regular exercise. This is one of the most important things you can do for your health. Most adults should: Exercise for at least 150 minutes each week. The exercise should increase your heart rate and make you sweat (moderate-intensity exercise). Do strengthening exercises at least twice a week. This is in addition to the moderate-intensity exercise. Spend less time sitting. Even light physical activity can be beneficial. Watch cholesterol and blood lipids Have your blood tested for lipids and cholesterol at 72 years of age, then have this test every 5 years. You may need to have your cholesterol levels checked more often if: Your lipid or cholesterol levels are high. You are older than 72 years of age. You are at high risk for heart disease. What should I know about cancer screening? Many types of cancers can be detected early and may often be prevented. Depending on your health history and family history, you may need to have cancer screening at various ages. This may include screening for: Colorectal cancer. Prostate cancer. Skin cancer. Lung  cancer. What should I know about heart disease, diabetes, and high blood pressure? Blood pressure and heart disease High blood pressure causes heart disease and increases the risk of stroke. This is more likely to develop in people who have high blood pressure readings or are overweight. Talk with your health care provider about your target blood pressure readings. Have your blood pressure checked: Every 3-5 years if you are 18-39 years of age. Every year if you are 40 years old or older. If you are between the ages of 65 and 75 and are a current or former smoker, ask your health care provider if you should have a one-time screening for abdominal aortic aneurysm (AAA). Diabetes Have regular diabetes screenings. This checks your fasting blood sugar level. Have the screening done: Once every three years after age 45 if you are at a normal weight and have a low risk for diabetes. More often and at a younger age if you are overweight or have a high risk for diabetes. What should I know about preventing infection? Hepatitis B If you have a higher risk for hepatitis B, you should be screened for this virus. Talk with your health care provider to find out if you are at risk for hepatitis B infection. Hepatitis C Blood testing is recommended for: Everyone born from 1945 through 1965. Anyone with known risk factors for hepatitis C. Sexually transmitted infections (STIs) You should be screened each year for STIs, including gonorrhea and chlamydia, if: You are sexually active and are younger than 72 years of age. You are older than 72 years of age and your   health care provider tells you that you are at risk for this type of infection. Your sexual activity has changed since you were last screened, and you are at increased risk for chlamydia or gonorrhea. Ask your health care provider if you are at risk. Ask your health care provider about whether you are at high risk for HIV. Your health care provider  may recommend a prescription medicine to help prevent HIV infection. If you choose to take medicine to prevent HIV, you should first get tested for HIV. You should then be tested every 3 months for as long as you are taking the medicine. Follow these instructions at home: Alcohol use Do not drink alcohol if your health care provider tells you not to drink. If you drink alcohol: Limit how much you have to 0-2 drinks a day. Know how much alcohol is in your drink. In the [place] one drink equals one 12 oz bottle of beer (355 mL), one 5 oz glass of wine (148 mL), or one 1 oz glass of hard liquor (44 mL). Lifestyle Do not use any products that contain nicotine or tobacco. These products include cigarettes, chewing tobacco, and vaping devices, such as e-cigarettes. If you need help quitting, ask your health care provider. Do not use street drugs. Do not share needles. Ask your health care provider for help if you need support or information about quitting drugs. General instructions Schedule regular health, dental, and eye exams. Stay current with your vaccines. Tell your health care provider if: You often feel depressed. You have ever been abused or do not feel safe at home. Summary Adopting a healthy lifestyle and getting preventive care are important in promoting health and wellness. Follow your health care provider's instructions about healthy diet, exercising, and getting tested or screened for diseases. Follow your health care provider's instructions on monitoring your cholesterol and blood pressure. This information is not intended to replace advice given to you by your health care provider. Make sure you discuss any questions you have with your health care provider. Document Revised: 11/02/2020 Document Reviewed: 11/02/2020 Elsevier Patient Education  2023 Elsevier Inc.  

## 2022-03-08 ENCOUNTER — Encounter: Payer: Self-pay | Admitting: Internal Medicine

## 2022-03-08 DIAGNOSIS — N400 Enlarged prostate without lower urinary tract symptoms: Secondary | ICD-10-CM | POA: Insufficient documentation

## 2022-03-08 DIAGNOSIS — Z23 Encounter for immunization: Secondary | ICD-10-CM | POA: Insufficient documentation

## 2022-03-08 DIAGNOSIS — E1121 Type 2 diabetes mellitus with diabetic nephropathy: Secondary | ICD-10-CM | POA: Insufficient documentation

## 2022-03-08 MED ORDER — DAPAGLIFLOZIN PROPANEDIOL 10 MG PO TABS: 10.0000 mg | ORAL_TABLET | Freq: Every day | ORAL | 1 refills | Status: DC

## 2022-03-14 ENCOUNTER — Other Ambulatory Visit: Payer: Self-pay | Admitting: Internal Medicine

## 2022-03-14 DIAGNOSIS — F5104 Psychophysiologic insomnia: Secondary | ICD-10-CM

## 2022-03-14 DIAGNOSIS — M1000 Idiopathic gout, unspecified site: Secondary | ICD-10-CM

## 2022-03-14 DIAGNOSIS — E785 Hyperlipidemia, unspecified: Secondary | ICD-10-CM

## 2022-03-15 MED ORDER — ZOLPIDEM TARTRATE 5 MG PO TABS: 5.0000 mg | ORAL_TABLET | Freq: Every evening | ORAL | 1 refills | Status: DC | PRN

## 2022-03-15 MED ORDER — PRAVASTATIN SODIUM 20 MG PO TABS: 20.0000 mg | ORAL_TABLET | Freq: Every day | ORAL | 1 refills | Status: DC

## 2022-03-15 MED ORDER — ALLOPURINOL 100 MG PO TABS: 200.0000 mg | ORAL_TABLET | Freq: Every day | ORAL | 1 refills | Status: DC

## 2022-03-17 ENCOUNTER — Telehealth: Payer: Self-pay

## 2022-03-17 ENCOUNTER — Other Ambulatory Visit: Payer: Self-pay | Admitting: Internal Medicine

## 2022-03-17 DIAGNOSIS — E785 Hyperlipidemia, unspecified: Secondary | ICD-10-CM

## 2022-03-17 DIAGNOSIS — M1000 Idiopathic gout, unspecified site: Secondary | ICD-10-CM

## 2022-03-17 DIAGNOSIS — N522 Drug-induced erectile dysfunction: Secondary | ICD-10-CM

## 2022-03-17 DIAGNOSIS — F5104 Psychophysiologic insomnia: Secondary | ICD-10-CM

## 2022-03-17 DIAGNOSIS — I10 Essential (primary) hypertension: Secondary | ICD-10-CM

## 2022-03-17 DIAGNOSIS — I5042 Chronic combined systolic (congestive) and diastolic (congestive) heart failure: Secondary | ICD-10-CM

## 2022-03-17 DIAGNOSIS — E1121 Type 2 diabetes mellitus with diabetic nephropathy: Secondary | ICD-10-CM

## 2022-03-17 MED ORDER — SPIRONOLACTONE 25 MG PO TABS: 25.0000 mg | ORAL_TABLET | Freq: Every day | ORAL | 1 refills | Status: DC

## 2022-03-17 MED ORDER — DAPAGLIFLOZIN PROPANEDIOL 10 MG PO TABS: 10.0000 mg | ORAL_TABLET | Freq: Every day | ORAL | 1 refills | Status: DC

## 2022-03-17 MED ORDER — SILDENAFIL CITRATE 20 MG PO TABS: 80.0000 mg | ORAL_TABLET | Freq: Every day | ORAL | 0 refills | Status: DC | PRN

## 2022-03-17 MED ORDER — ALLOPURINOL 100 MG PO TABS: 200.0000 mg | ORAL_TABLET | Freq: Every day | ORAL | 1 refills | Status: DC

## 2022-03-17 MED ORDER — ZOLPIDEM TARTRATE 5 MG PO TABS: 5.0000 mg | ORAL_TABLET | Freq: Every evening | ORAL | 1 refills | Status: DC | PRN

## 2022-03-17 MED ORDER — PRAVASTATIN SODIUM 20 MG PO TABS: 20.0000 mg | ORAL_TABLET | Freq: Every day | ORAL | 1 refills | Status: DC

## 2022-03-17 MED ORDER — ENTRESTO 24-26 MG PO TABS: 1.0000 | ORAL_TABLET | Freq: Two times a day (BID) | ORAL | 0 refills | Status: DC

## 2022-03-17 NOTE — Telephone Encounter (Signed)
MEDICATION:spironolactone (ALDACTONE) 25 MG tablet -  pravastatin (PRAVACHOL) 20 MG tablet  -- zolpidem (AMBIEN) 5 MG tablet -- allopurinol (ZYLOPRIM) 100 MG tablet -- sildenafil (REVATIO) 20 MG tablet  -- sacubitril-valsartan (ENTRESTO) 24-26 MG -- dapagliflozin propanediol (FARXIGA) 10 MG TABS tablet   PHARMACY: [facility] (Address: [address] [place] [address])  Comments: [facility] states patient reached out and asked for a refill on all the medications listed above   **Let patient know to contact pharmacy at the end of the day to make sure medication is ready. **  ** Please notify patient to allow 48-72 hours to process**  **Encourage patient to contact the pharmacy for refills or they can request refills through [facility]**

## 2022-03-18 NOTE — Telephone Encounter (Signed)
Brichelle from [facility], She said that before filling the sildenafil a PA would need to be done and said that we would need to contact the insurance to start the PA. She said the number [phone]. [facility] call back number if needed is [phone] option 3, She said would have to refer to the patients invoice number which is [id number]

## 2022-03-23 NOTE — Telephone Encounter (Signed)
Contacted pharmacy for insurance info as what we have was not pulling up.  BIN [id number] PCN [id number] Member ID [id number]

## 2022-03-23 NOTE — Telephone Encounter (Addendum)
Denial letter received via e-fax  Dx used of ED is not an approved indication for the medication.  Determination sent to scan.

## 2022-03-23 NOTE — Telephone Encounter (Signed)
Key: [id number]

## 2022-03-23 NOTE — Telephone Encounter (Signed)
Per CoverMyMeds PA was denied  No reason given.  

## 2022-04-13 ENCOUNTER — Other Ambulatory Visit: Payer: Self-pay | Admitting: Internal Medicine

## 2022-04-13 DIAGNOSIS — I5042 Chronic combined systolic (congestive) and diastolic (congestive) heart failure: Secondary | ICD-10-CM

## 2022-04-13 DIAGNOSIS — I10 Essential (primary) hypertension: Secondary | ICD-10-CM

## 2022-04-13 MED ORDER — SPIRONOLACTONE 25 MG PO TABS: 25.0000 mg | ORAL_TABLET | Freq: Every day | ORAL | 0 refills | Status: DC

## 2022-05-09 ENCOUNTER — Telehealth: Payer: Self-pay | Admitting: Internal Medicine

## 2022-05-09 ENCOUNTER — Ambulatory Visit (INDEPENDENT_AMBULATORY_CARE_PROVIDER_SITE_OTHER): Payer: BC Managed Care – PPO | Admitting: Orthopaedic Surgery

## 2022-05-09 ENCOUNTER — Encounter: Payer: Self-pay | Admitting: Orthopaedic Surgery

## 2022-05-09 ENCOUNTER — Ambulatory Visit (INDEPENDENT_AMBULATORY_CARE_PROVIDER_SITE_OTHER): Payer: BC Managed Care – PPO

## 2022-05-09 ENCOUNTER — Other Ambulatory Visit: Payer: Self-pay | Admitting: Internal Medicine

## 2022-05-09 DIAGNOSIS — Z96641 Presence of right artificial hip joint: Secondary | ICD-10-CM

## 2022-05-09 DIAGNOSIS — I5042 Chronic combined systolic (congestive) and diastolic (congestive) heart failure: Secondary | ICD-10-CM

## 2022-05-09 DIAGNOSIS — I10 Essential (primary) hypertension: Secondary | ICD-10-CM

## 2022-05-09 DIAGNOSIS — F5104 Psychophysiologic insomnia: Secondary | ICD-10-CM

## 2022-05-09 MED ORDER — ZOLPIDEM TARTRATE 5 MG PO TABS: 5.0000 mg | ORAL_TABLET | Freq: Every evening | ORAL | 1 refills | Status: DC | PRN

## 2022-05-09 MED ORDER — ENTRESTO 24-26 MG PO TABS: 1.0000 | ORAL_TABLET | Freq: Two times a day (BID) | ORAL | 1 refills | Status: DC

## 2022-05-09 MED ORDER — TORSEMIDE 20 MG PO TABS: 20.0000 mg | ORAL_TABLET | Freq: Every day | ORAL | 1 refills | Status: DC

## 2022-05-09 NOTE — Progress Notes (Signed)
The patient is an active 71 year old gentleman who is now 7 months status post a right total hip arthroplasty.  He says doing well and has good range of motion and strength and no issues with that hip at all.  He states that it feels significantly better than what it felt like before surgery.  He walks with a normal-appearing gait.  His right hip moves smoothly and fluidly.  His left hip also moves smoothly and fluidly.  His leg lengths are equal.  An AP pelvis lateral the right hip shows a well-seated total hip arthroplasty on the right side with no complicating features.  There is some heterotopic bone posterior to the joint but there is no blocks to rotation and the components look great.  From my standpoint follow-up can be as needed since he is doing so well.  If he does develop any issues he knows to let us know.  All questions and concerns were answered and addressed.

## 2022-05-09 NOTE — Telephone Encounter (Signed)
Patient wants 90 day scripts with refills on the follow prescriptions:  Entresto 24-26 mg Valsartan 80 mg Torsamide tabs 20 mg. Zolpidem tartrate '5mg'$ .

## 2022-05-12 ENCOUNTER — Other Ambulatory Visit: Payer: Self-pay | Admitting: Internal Medicine

## 2022-05-12 DIAGNOSIS — I10 Essential (primary) hypertension: Secondary | ICD-10-CM

## 2022-05-12 DIAGNOSIS — I5042 Chronic combined systolic (congestive) and diastolic (congestive) heart failure: Secondary | ICD-10-CM

## 2022-05-13 ENCOUNTER — Other Ambulatory Visit: Payer: Self-pay | Admitting: Internal Medicine

## 2022-05-13 DIAGNOSIS — E785 Hyperlipidemia, unspecified: Secondary | ICD-10-CM

## 2022-05-13 DIAGNOSIS — F5104 Psychophysiologic insomnia: Secondary | ICD-10-CM

## 2022-05-13 DIAGNOSIS — I10 Essential (primary) hypertension: Secondary | ICD-10-CM

## 2022-05-13 DIAGNOSIS — M1000 Idiopathic gout, unspecified site: Secondary | ICD-10-CM

## 2022-05-13 DIAGNOSIS — I5042 Chronic combined systolic (congestive) and diastolic (congestive) heart failure: Secondary | ICD-10-CM

## 2022-05-13 MED ORDER — TORSEMIDE 20 MG PO TABS: 20.0000 mg | ORAL_TABLET | Freq: Every day | ORAL | 1 refills | Status: DC

## 2022-05-13 MED ORDER — PRAVASTATIN SODIUM 20 MG PO TABS: 20.0000 mg | ORAL_TABLET | Freq: Every day | ORAL | 1 refills | Status: DC

## 2022-05-13 MED ORDER — ZOLPIDEM TARTRATE 5 MG PO TABS: 5.0000 mg | ORAL_TABLET | Freq: Every evening | ORAL | 1 refills | Status: DC | PRN

## 2022-05-13 MED ORDER — ALLOPURINOL 100 MG PO TABS: 200.0000 mg | ORAL_TABLET | Freq: Every day | ORAL | 1 refills | Status: DC

## 2022-07-07 ENCOUNTER — Other Ambulatory Visit: Payer: Self-pay | Admitting: Internal Medicine

## 2022-07-07 DIAGNOSIS — F5104 Psychophysiologic insomnia: Secondary | ICD-10-CM

## 2022-08-02 ENCOUNTER — Other Ambulatory Visit: Payer: Self-pay | Admitting: Nephrology

## 2022-08-02 DIAGNOSIS — N184 Chronic kidney disease, stage 4 (severe): Secondary | ICD-10-CM

## 2022-08-15 ENCOUNTER — Other Ambulatory Visit: Payer: Self-pay | Admitting: Internal Medicine

## 2022-08-15 DIAGNOSIS — I5042 Chronic combined systolic (congestive) and diastolic (congestive) heart failure: Secondary | ICD-10-CM

## 2022-08-15 DIAGNOSIS — I10 Essential (primary) hypertension: Secondary | ICD-10-CM

## 2022-08-16 ENCOUNTER — Other Ambulatory Visit: Payer: Self-pay | Admitting: Internal Medicine

## 2022-08-16 DIAGNOSIS — F5104 Psychophysiologic insomnia: Secondary | ICD-10-CM

## 2022-08-29 ENCOUNTER — Other Ambulatory Visit: Payer: Self-pay | Admitting: Internal Medicine

## 2022-08-29 DIAGNOSIS — E1121 Type 2 diabetes mellitus with diabetic nephropathy: Secondary | ICD-10-CM

## 2022-08-29 DIAGNOSIS — I5042 Chronic combined systolic (congestive) and diastolic (congestive) heart failure: Secondary | ICD-10-CM

## 2022-08-31 ENCOUNTER — Ambulatory Visit
Admission: RE | Admit: 2022-08-31 | Discharge: 2022-08-31 | Disposition: A | Discharge status: Home / Self Care | Payer: BC Managed Care – PPO | Source: Ambulatory Visit | Attending: Nephrology | Admitting: Nephrology

## 2022-08-31 DIAGNOSIS — N184 Chronic kidney disease, stage 4 (severe): Secondary | ICD-10-CM

## 2022-09-01 ENCOUNTER — Encounter: Payer: Self-pay | Admitting: Radiology

## 2022-09-06 LAB — HM DIABETES EYE EXAM

## 2022-10-08 ENCOUNTER — Ambulatory Visit
Admission: EM | Admit: 2022-10-08 | Discharge: 2022-10-08 | Disposition: A | Discharge status: Home / Self Care | Payer: BC Managed Care – PPO | Attending: Emergency Medicine | Admitting: Emergency Medicine

## 2022-10-08 DIAGNOSIS — M109 Gout, unspecified: Secondary | ICD-10-CM | POA: Diagnosis not present

## 2022-10-08 MED ORDER — PREDNISONE 10 MG (21) PO TBPK: ORAL_TABLET | Freq: Every day | ORAL | 0 refills | Status: DC

## 2022-10-08 NOTE — Discharge Instructions (Addendum)
Prescribed prednisone take as directed and to completion Current new allopurinol  as directed Follow up with PCP for further evaluation and management Return or go to the ED if you have any new or worsening symptoms

## 2022-10-08 NOTE — ED Triage Notes (Addendum)
[facility] with [facility] of gout pain in right wrist since last night. Pt reports aleve at home.no current injuries to this wrist. Pmh of gout

## 2022-10-08 NOTE — ED Provider Notes (Signed)
[facility]   [id number] 10/08/22 Arrival Time: 1151   Chief Complaint  Patient presents with   gout problem      SUBJECTIVE: History from: patient.  John Hogan. is a 72 y.o. male w presented to the urgent care with a complaint of gout pain to the right second and third finger.  Denies any precipitating event.  Localized pain and swelling to the right second and third finger joint.  Is currently taking allopurinol.  Symptoms are made worse with ROM.  Reports similar symptoms in the past that was treated with prednisone.  Denies any aggravating factors.  Denies chills, fever, nausea, vomiting, diarrhea   ROS: As per HPI.  All other pertinent ROS negative.      Past Medical History:  Diagnosis Date   AI (aortic insufficiency)    Arthritis    CHF (congestive heart failure)    Chronic kidney disease    Glaucoma    Gout    H/O exercise stress test    Myoview 01/03/12: No scar or ischemia, EF 53%   Hyperlipidemia    Hypertension    Hypertensive cardiomyopathy    EF previously 35%;  echo 12/2012 EF 55%, mild AI, ascending aorta mildly dilated, mild LAE   Past Surgical History:  Procedure Laterality Date   COLONOSCOPY     CYST REMOVAL HAND     HERNIA REPAIR     Umbilical   MASS EXCISION Left 08/04/2020   Procedure: EXCISION MASS LEFT PALM;  Surgeon: Cindee Salt, MD;  Location: [facility];  Service: Orthopedics;  Laterality: Left;   POLYPECTOMY     TOTAL HIP ARTHROPLASTY Right 08/27/2021   Procedure: Right HIP ARTHROPLASTY ANTERIOR APPROACH;  Surgeon: Kathryne Hitch, MD;  Location: [facility];  Service: Orthopedics;  Laterality: Right;   WISDOM TOOTH EXTRACTION     WRIST SURGERY     right   No Known Allergies No current facility-administered medications on file prior to encounter.   Current Outpatient Medications on File Prior to Encounter  Medication Sig Dispense Refill   allopurinol (ZYLOPRIM) 100 MG tablet Take 2 tablets (200 mg total)  by mouth daily. 180 tablet 1   Cholecalciferol (VITAMIN D3) 50 MCG (2000 UT) capsule TAKE 1 CAPSULE BY MOUTH EVERY DAY 90 capsule 1   colchicine 0.6 MG tablet Take 1 tablet (0.6 mg total) by mouth daily. When having flare (Patient taking differently: Take 0.6 mg by mouth daily as needed. When having flare) 5 tablet 2   dapagliflozin propanediol (FARXIGA) 10 MG TABS tablet Take 1 tablet (10 mg total) by mouth daily before breakfast. 90 tablet 1   pravastatin (PRAVACHOL) 20 MG tablet Take 1 tablet (20 mg total) by mouth daily. 90 tablet 1   sacubitril-valsartan (ENTRESTO) 24-26 MG Take 1 tablet by mouth 2 (two) times daily. 180 tablet 1   sildenafil (REVATIO) 20 MG tablet Take 4 tablets (80 mg total) by mouth daily as needed. 360 tablet 0   SIMBRINZA 1-0.2 % SUSP Place 1 drop into both eyes in the morning and at bedtime.  3   spironolactone (ALDACTONE) 25 MG tablet TAKE ONE TABLET BY MOUTH DAILY 90 tablet 2   timolol (TIMOPTIC) 0.5 % ophthalmic solution Place 1 drop into both eyes 2 (two) times daily.  3   torsemide (DEMADEX) 20 MG tablet Take 1 tablet (20 mg total) by mouth daily. 90 tablet 1   Travoprost, BAK Free, (TRAVATAN) 0.004 % SOLN ophthalmic solution Place 1  drop into both eyes at bedtime.      zolpidem (AMBIEN) 5 MG tablet TAKE ONE TABLET BY MOUTH AT BEDTIME AS NEEDED FOR SLEEP 90 tablet 0   Social History   Socioeconomic History   Marital status: Married    Spouse name: Not on file   Number of children: 2   Years of education: Not on file   Highest education level: Not on file  Occupational History    Employer: [employer]  Tobacco Use   Smoking status: Former    Packs/day: 1.00    Years: 2.00    Additional pack years: 0.00    Total pack years: 2.00    Types: Cigarettes    Quit date: 01/10/1975    Years since quitting: 47.7   Smokeless tobacco: Never   Tobacco comments:    Only smoked two years  Vaping Use   Vaping Use: Never used  Substance and Sexual Activity    Alcohol use: No   Drug use: No   Sexual activity: Yes  Other Topics Concern   Not on file  Social History Narrative   Lives at home with wife.   Social Determinants of Health   Financial Resource Strain: Not on file  Food Insecurity: Not on file  Transportation Needs: Not on file  Physical Activity: Not on file  Stress: Not on file  Social Connections: Not on file  Intimate Partner Violence: Not on file   Family History  Problem Relation Age of Onset   Hypertension Father    Colon cancer Father 47   Hyperlipidemia Father    Heart disease Father    Diabetes Father    Stroke Mother        deceased   Colon cancer Mother 43   Arthritis Mother    Hyperlipidemia Mother    Hypertension Mother    Diabetes Mother    Colon polyps Sister    Colon polyps Brother    COPD Neg Hx    Alcohol abuse Neg Hx    Kidney disease Neg Hx    Esophageal cancer Neg Hx    Rectal cancer Neg Hx    Stomach cancer Neg Hx     OBJECTIVE:  Vitals:   10/08/22 1252  BP: 108/69  Pulse: (!) 46  Resp: 19  Temp: 97.6 F (36.4 C)  SpO2: 98%     Physical Exam Vitals and nursing note reviewed.  Constitutional:      General: He is not in acute distress.    Appearance: Normal appearance. He is normal weight. He is not ill-appearing, toxic-appearing or diaphoretic.  Cardiovascular:     Rate and Rhythm: Normal rate and regular rhythm.     Pulses: Normal pulses.     Heart sounds: Normal heart sounds. No murmur heard.    No friction rub. No gallop.  Pulmonary:     Effort: Pulmonary effort is normal. No respiratory distress.     Breath sounds: Normal breath sounds. No stridor. No wheezing, rhonchi or rales.  Chest:     Chest wall: No tenderness.  Musculoskeletal:     Right hand: Tenderness present.     Left hand: Normal.     Comments: There is swelling and warmth present on the second and third finger of right hand. Limited range of motion with pain.  Neurovascular status intact.   Neurological:     Mental Status: He is alert and oriented to person, place, and time.      LABS:  No results found for this or any previous visit (from the past 24 hour(s)).   ASSESSMENT & PLAN:  1. Acute gout of right hand, unspecified cause     Meds ordered this encounter  Medications   predniSONE (STERAPRED UNI-PAK 21 TAB) 10 MG (21) TBPK tablet    Sig: Take by mouth daily. Take 6 tabs by mouth daily  for 1 day, then 5 tabs for 1 day, then 4 tabs for 1 day, then 3 tabs for 1 day, 2 tabs for 1 day, then 1 tab by mouth daily for 1 day    Dispense:  21 tablet    Refill:  0    Discharge instructions  Prescribed prednisone take as directed and to completion Current new allopurinol  as directed Follow up with PCP for further evaluation and management Return or go to the ED if you have any new or worsening symptoms  Reviewed expectations re: course of current medical issues. Questions answered. Outlined signs and symptoms indicating need for more acute intervention. Patient verbalized understanding. After Visit Summary given.          Durward Parcel, FNP 10/08/22 1322

## 2022-11-08 ENCOUNTER — Other Ambulatory Visit: Payer: Self-pay | Admitting: Internal Medicine

## 2022-11-08 DIAGNOSIS — M1000 Idiopathic gout, unspecified site: Secondary | ICD-10-CM

## 2022-11-08 DIAGNOSIS — E785 Hyperlipidemia, unspecified: Secondary | ICD-10-CM

## 2022-11-08 DIAGNOSIS — F5104 Psychophysiologic insomnia: Secondary | ICD-10-CM

## 2022-11-10 ENCOUNTER — Other Ambulatory Visit: Payer: Self-pay | Admitting: Internal Medicine

## 2022-11-10 DIAGNOSIS — E785 Hyperlipidemia, unspecified: Secondary | ICD-10-CM

## 2022-11-10 DIAGNOSIS — F5104 Psychophysiologic insomnia: Secondary | ICD-10-CM

## 2022-11-10 DIAGNOSIS — M1000 Idiopathic gout, unspecified site: Secondary | ICD-10-CM

## 2022-11-24 ENCOUNTER — Other Ambulatory Visit: Payer: Self-pay | Admitting: Internal Medicine

## 2022-11-24 ENCOUNTER — Ambulatory Visit (INDEPENDENT_AMBULATORY_CARE_PROVIDER_SITE_OTHER): Payer: BC Managed Care – PPO | Admitting: Internal Medicine

## 2022-11-24 ENCOUNTER — Encounter: Payer: Self-pay | Admitting: Internal Medicine

## 2022-11-24 VITALS — BP 120/72 | HR 50 | Temp 98.1°F | Resp 16 | Ht 71.0 in | Wt 226.0 lb

## 2022-11-24 DIAGNOSIS — F5104 Psychophysiologic insomnia: Secondary | ICD-10-CM | POA: Diagnosis not present

## 2022-11-24 DIAGNOSIS — N1831 Chronic kidney disease, stage 3a: Secondary | ICD-10-CM

## 2022-11-24 DIAGNOSIS — E785 Hyperlipidemia, unspecified: Secondary | ICD-10-CM

## 2022-11-24 DIAGNOSIS — I10 Essential (primary) hypertension: Secondary | ICD-10-CM | POA: Diagnosis not present

## 2022-11-24 DIAGNOSIS — M1000 Idiopathic gout, unspecified site: Secondary | ICD-10-CM

## 2022-11-24 DIAGNOSIS — I119 Hypertensive heart disease without heart failure: Secondary | ICD-10-CM | POA: Diagnosis not present

## 2022-11-24 DIAGNOSIS — R001 Bradycardia, unspecified: Secondary | ICD-10-CM

## 2022-11-24 DIAGNOSIS — I5042 Chronic combined systolic (congestive) and diastolic (congestive) heart failure: Secondary | ICD-10-CM

## 2022-11-24 DIAGNOSIS — Z7984 Long term (current) use of oral hypoglycemic drugs: Secondary | ICD-10-CM

## 2022-11-24 DIAGNOSIS — E1121 Type 2 diabetes mellitus with diabetic nephropathy: Secondary | ICD-10-CM

## 2022-11-24 LAB — BASIC METABOLIC PANEL
BUN: 33 mg/dL — ABNORMAL HIGH (ref 6–23)
CO2: 29 mEq/L (ref 19–32)
Calcium: 9.3 mg/dL (ref 8.4–10.5)
Chloride: 100 mEq/L (ref 96–112)
Creatinine, Ser: 2.59 mg/dL — ABNORMAL HIGH (ref 0.40–1.50)
GFR: 23.9 mL/min — ABNORMAL LOW (ref >60.00)
Glucose, Bld: 93 mg/dL (ref 70–99)
Potassium: 4.1 mEq/L (ref 3.5–5.1)
Sodium: 137 mEq/L (ref 135–145)

## 2022-11-24 LAB — CBC WITH DIFFERENTIAL/PLATELET
Basophils Absolute: 0 10*3/uL (ref 0.0–0.1)
Basophils Relative: 0.8 % (ref 0.0–3.0)
Eosinophils Absolute: 0.3 10*3/uL (ref 0.0–0.7)
Eosinophils Relative: 7.3 % — ABNORMAL HIGH (ref 0.0–5.0)
HCT: 43.9 % (ref 39.0–52.0)
Hemoglobin: 14.6 g/dL (ref 13.0–17.0)
Lymphocytes Relative: 39.8 % (ref 12.0–46.0)
Lymphs Abs: 1.4 10*3/uL (ref 0.7–4.0)
MCHC: 33.4 g/dL (ref 30.0–36.0)
MCV: 96.2 fl (ref 78.0–100.0)
Monocytes Absolute: 0.3 10*3/uL (ref 0.1–1.0)
Monocytes Relative: 9.6 % (ref 3.0–12.0)
Neutro Abs: 1.5 10*3/uL (ref 1.4–7.7)
Neutrophils Relative %: 42.5 % — ABNORMAL LOW (ref 43.0–77.0)
Platelets: 183 10*3/uL (ref 150.0–400.0)
RBC: 4.56 Mil/uL (ref 4.22–5.81)
RDW: 14.5 % (ref 11.5–15.5)
WBC: 3.6 10*3/uL — ABNORMAL LOW (ref 4.0–10.5)

## 2022-11-24 LAB — MICROALBUMIN / CREATININE URINE RATIO
Creatinine,U: 49.7 mg/dL
Microalb Creat Ratio: 1.4 mg/g (ref 0.0–30.0)
Microalb, Ur: <0.7 mg/dL (ref 0.0–1.9)

## 2022-11-24 LAB — TSH: TSH: 1.57 u[IU]/mL (ref 0.35–5.50)

## 2022-11-24 LAB — URINALYSIS, ROUTINE W REFLEX MICROSCOPIC
Bilirubin Urine: NEGATIVE
Hgb urine dipstick: NEGATIVE
Ketones, ur: NEGATIVE
Leukocytes,Ua: NEGATIVE
Nitrite: NEGATIVE
RBC / HPF: NONE SEEN (ref 0–?)
Specific Gravity, Urine: 1.01 (ref 1.000–1.030)
Total Protein, Urine: NEGATIVE
Urine Glucose: 500 — AB
Urobilinogen, UA: 0.2 (ref 0.0–1.0)
pH: 5.5 (ref 5.0–8.0)

## 2022-11-24 LAB — HEMOGLOBIN A1C: Hgb A1c MFr Bld: 6.7 % — ABNORMAL HIGH (ref 4.6–6.5)

## 2022-11-24 LAB — URIC ACID: Uric Acid, Serum: 5.6 mg/dL (ref 4.0–7.8)

## 2022-11-24 MED ORDER — PRAVASTATIN SODIUM 20 MG PO TABS: 20.0000 mg | ORAL_TABLET | Freq: Every day | ORAL | 1 refills | Status: DC

## 2022-11-24 MED ORDER — DAPAGLIFLOZIN PROPANEDIOL 10 MG PO TABS
10.0000 mg | ORAL_TABLET | Freq: Every day | ORAL | 1 refills | Status: DC
Start: 2022-11-24 — End: 2023-06-19

## 2022-11-24 MED ORDER — ALLOPURINOL 100 MG PO TABS: 100.0000 mg | ORAL_TABLET | Freq: Every day | ORAL | 1 refills | Status: DC

## 2022-11-24 MED ORDER — ENTRESTO 24-26 MG PO TABS: 1.0000 | ORAL_TABLET | Freq: Two times a day (BID) | ORAL | 0 refills | Status: DC

## 2022-11-24 NOTE — Progress Notes (Signed)
Subjective:  Patient ID: John Wolthuis., male    DOB: 1951-02-18  Age: 72 y.o. MRN: [id number]  CC: Anemia, Hypertension, Diabetes, and Congestive Heart Failure   HPI John Jock. presents for f/up --   He is active and denies chest pain, shortness of breath, diaphoresis, edema, dizziness, or lightheadedness.  Outpatient Medications Prior to Visit  Medication Sig Dispense Refill   Cholecalciferol (VITAMIN D3) 50 MCG (2000 UT) capsule TAKE 1 CAPSULE BY MOUTH EVERY DAY 90 capsule 1   colchicine 0.6 MG tablet Take 1 tablet (0.6 mg total) by mouth daily. When having flare (Patient taking differently: Take 0.6 mg by mouth daily as needed. When having flare) 5 tablet 2   sildenafil (REVATIO) 20 MG tablet Take 4 tablets (80 mg total) by mouth daily as needed. 360 tablet 0   SIMBRINZA 1-0.2 % SUSP Place 1 drop into both eyes in the morning and at bedtime.  3   spironolactone (ALDACTONE) 25 MG tablet TAKE ONE TABLET BY MOUTH DAILY 90 tablet 2   timolol (TIMOPTIC) 0.5 % ophthalmic solution Place 1 drop into both eyes 2 (two) times daily.  3   torsemide (DEMADEX) 20 MG tablet Take 1 tablet (20 mg total) by mouth daily. 90 tablet 1   Travoprost, BAK Free, (TRAVATAN) 0.004 % SOLN ophthalmic solution Place 1 drop into both eyes at bedtime.      allopurinol (ZYLOPRIM) 100 MG tablet Take 2 tablets (200 mg total) by mouth daily. 180 tablet 1   dapagliflozin propanediol (FARXIGA) 10 MG TABS tablet Take 1 tablet (10 mg total) by mouth daily before breakfast. 90 tablet 1   pravastatin (PRAVACHOL) 20 MG tablet Take 1 tablet (20 mg total) by mouth daily. 90 tablet 1   predniSONE (STERAPRED UNI-PAK 21 TAB) 10 MG (21) TBPK tablet Take by mouth daily. Take 6 tabs by mouth daily  for 1 day, then 5 tabs for 1 day, then 4 tabs for 1 day, then 3 tabs for 1 day, 2 tabs for 1 day, then 1 tab by mouth daily for 1 day 21 tablet 0   sacubitril-valsartan (ENTRESTO) 24-26 MG Take 1 tablet by mouth 2 (two) times daily.  180 tablet 1   zolpidem (AMBIEN) 5 MG tablet TAKE ONE TABLET BY MOUTH AT BEDTIME AS NEEDED FOR SLEEP 90 tablet 0   No facility-administered medications prior to visit.    ROS Review of Systems  Constitutional:  Positive for unexpected weight change (wt gain). Negative for appetite change, diaphoresis and fatigue.  HENT: Negative.    Eyes: Negative.   Respiratory:  Negative for cough, chest tightness, shortness of breath and wheezing.   Cardiovascular:  Negative for leg swelling.  Gastrointestinal:  Negative for abdominal pain, constipation, diarrhea, nausea and vomiting.  Endocrine: Negative.   Genitourinary: Negative.  Negative for difficulty urinating.  Musculoskeletal:  Positive for arthralgias. Negative for joint swelling and myalgias.  Skin: Negative.   Neurological:  Negative for dizziness, weakness and light-headedness.  Hematological:  Negative for adenopathy. Does not bruise/bleed easily.  Psychiatric/Behavioral: Negative.      Objective:  BP 120/72 (BP Location: Right Arm, Patient Position: Sitting, Cuff Size: Large)   Pulse (!) 50   Temp 98.1 F (36.7 C) (Oral)   Resp 16   Ht 5\' 11"  (1.803 m)   Wt 226 lb (102.5 kg)   SpO2 93%   BMI 31.52 kg/m   BP Readings from Last 3 Encounters:  11/24/22 120/72  10/08/22 108/69  03/07/22 118/72    Wt Readings from Last 3 Encounters:  11/24/22 226 lb (102.5 kg)  03/07/22 220 lb (99.8 kg)  10/22/21 219 lb (99.3 kg)    Physical Exam Vitals reviewed.  HENT:     Nose: Nose normal.     Mouth/Throat:     Mouth: Mucous membranes are moist.  Eyes:     General: No scleral icterus.    Conjunctiva/sclera: Conjunctivae normal.  Cardiovascular:     Rate and Rhythm: Regular rhythm. Bradycardia present.     Heart sounds: S1 normal and S2 normal. Murmur heard.     Diastolic murmur is present with a grade of 1/4.     No gallop.     Comments: EKG- SB, 49 bpm LAD, incomplete RBBB Unchanged No LVH or Q waves Pulmonary:      Effort: Pulmonary effort is normal.     Breath sounds: No stridor. No wheezing, rhonchi or rales.  Abdominal:     General: Abdomen is flat.     Palpations: There is no mass.     Tenderness: There is no abdominal tenderness. There is no guarding.     Hernia: No hernia is present.  Musculoskeletal:        General: No swelling. Normal range of motion.     Cervical back: Neck supple.     Right lower leg: No edema.     Left lower leg: No edema.  Lymphadenopathy:     Cervical: No cervical adenopathy.  Skin:    General: Skin is warm and dry.     Findings: No rash.  Neurological:     General: No focal deficit present.     Mental Status: He is alert.  Psychiatric:        Mood and Affect: Mood normal.        Behavior: Behavior normal.     Lab Results  Component Value Date   WBC 3.6 (L) 11/24/2022   HGB 14.6 11/24/2022   HCT 43.9 11/24/2022   PLT 183.0 11/24/2022   GLUCOSE 93 11/24/2022   CHOL 183 03/07/2022   TRIG 93.0 03/07/2022   HDL 52.10 03/07/2022   LDLDIRECT 140.7 02/27/2013   LDLCALC 113 (H) 03/07/2022   ALT 10 03/07/2022   AST 13 03/07/2022   NA 137 11/24/2022   K 4.1 11/24/2022   CL 100 11/24/2022   CREATININE 2.59 (H) 11/24/2022   BUN 33 (H) 11/24/2022   CO2 29 11/24/2022   TSH 1.57 11/24/2022   PSA 2.57 03/07/2022   HGBA1C 6.7 (H) 11/24/2022   MICROALBUR <0.7 11/24/2022    No results found.  Assessment & Plan:   Stage 3a chronic kidney disease (HCC)- His renal function is able. -     Urinalysis, Routine w reflex microscopic; Future -     Microalbumin / creatinine urine ratio; Future -     Basic metabolic panel; Future -     Dapagliflozin Propanediol; Take 1 tablet (10 mg total) by mouth daily before breakfast.  Dispense: 90 tablet; Refill: 1  Idiopathic gout, unspecified chronicity, unspecified site -     Uric acid; Future -     Allopurinol; Take 1 tablet (100 mg total) by mouth daily.  Dispense: 90 tablet; Refill: 1  Psychophysiological insomnia -      TSH; Future -     Zolpidem Tartrate; Take 1 tablet (5 mg total) by mouth at bedtime as needed. for sleep  Dispense: 90 tablet; Refill: 0  COMBINED HEART FAILURE, CHRONIC-  He has a normal volume status. -     Dapagliflozin Propanediol; Take 1 tablet (10 mg total) by mouth daily before breakfast.  Dispense: 90 tablet; Refill: 1  Type II diabetes mellitus with nephropathy (HCC)- His blood sugar is well-controlled. -     Urinalysis, Routine w reflex microscopic; Future -     Microalbumin / creatinine urine ratio; Future -     Basic metabolic panel; Future -     Hemoglobin A1c; Future -     HM Diabetes Foot Exam -     Dapagliflozin Propanediol; Take 1 tablet (10 mg total) by mouth daily before breakfast.  Dispense: 90 tablet; Refill: 1  Essential hypertension, benign- His blood pressure is well-controlled. -     TSH; Future -     Urinalysis, Routine w reflex microscopic; Future -     Basic metabolic panel; Future -     CBC with Differential/Platelet; Future  Bradycardia- He is asymptomatic with this. -     TSH; Future -     EKG 12-Lead  Hypertensive heart disease without heart failure -     TSH; Future -     Urinalysis, Routine w reflex microscopic; Future  Hyperlipidemia with target LDL less than 100 -     TSH; Future -     Pravastatin Sodium; Take 1 tablet (20 mg total) by mouth daily.  Dispense: 90 tablet; Refill: 1     Follow-up: Return in about 6 months (around 05/27/2023).  Sanda Linger, MD

## 2022-11-24 NOTE — Patient Instructions (Signed)

## 2022-11-25 MED ORDER — ZOLPIDEM TARTRATE 5 MG PO TABS: 5.0000 mg | ORAL_TABLET | Freq: Every evening | ORAL | 0 refills | Status: DC | PRN

## 2022-12-02 ENCOUNTER — Other Ambulatory Visit: Payer: Self-pay | Admitting: Internal Medicine

## 2022-12-02 DIAGNOSIS — M1000 Idiopathic gout, unspecified site: Secondary | ICD-10-CM

## 2022-12-02 DIAGNOSIS — E785 Hyperlipidemia, unspecified: Secondary | ICD-10-CM

## 2022-12-02 DIAGNOSIS — F5104 Psychophysiologic insomnia: Secondary | ICD-10-CM

## 2022-12-04 ENCOUNTER — Other Ambulatory Visit: Payer: Self-pay | Admitting: Internal Medicine

## 2022-12-04 DIAGNOSIS — N522 Drug-induced erectile dysfunction: Secondary | ICD-10-CM

## 2022-12-05 ENCOUNTER — Other Ambulatory Visit: Payer: Self-pay | Admitting: Internal Medicine

## 2022-12-05 DIAGNOSIS — N522 Drug-induced erectile dysfunction: Secondary | ICD-10-CM

## 2022-12-05 MED ORDER — SILDENAFIL CITRATE 20 MG PO TABS: 80.0000 mg | ORAL_TABLET | Freq: Every day | ORAL | 0 refills | Status: DC | PRN

## 2022-12-09 ENCOUNTER — Ambulatory Visit
Admission: EM | Admit: 2022-12-09 | Discharge: 2022-12-09 | Disposition: A | Discharge status: Home / Self Care | Payer: BC Managed Care – PPO | Attending: Internal Medicine | Admitting: Internal Medicine

## 2022-12-09 DIAGNOSIS — J069 Acute upper respiratory infection, unspecified: Secondary | ICD-10-CM | POA: Diagnosis not present

## 2022-12-09 MED ORDER — CHLORASEPTIC 1.4 % MT LIQD: 1.0000 | OROMUCOSAL | 0 refills | Status: DC | PRN

## 2022-12-09 MED ORDER — FLUTICASONE PROPIONATE 50 MCG/ACT NA SUSP: 1.0000 | Freq: Every day | NASAL | 0 refills | Status: DC

## 2022-12-09 MED ORDER — BENZONATATE 100 MG PO CAPS: 100.0000 mg | ORAL_CAPSULE | Freq: Three times a day (TID) | ORAL | 0 refills | Status: DC | PRN

## 2022-12-09 NOTE — ED Provider Notes (Signed)
[facility]    CSN: [id number] Arrival date & time: 12/09/22  1137      History   Chief Complaint Chief Complaint  Patient presents with   Nasal Congestion    HPI John Hogan. is a 72 y.o. male.   Patient presents with sore throat, left ear discomfort, nasal congestion, mild nonproductive cough that started about 3 days ago.  Patient denies any fever or known sick contacts.  He has not taken any medication for his symptoms.  Denies chest pain, shortness of breath.  Denies history of asthma or COPD and patient does not smoke cigarettes.     Past Medical History:  Diagnosis Date   AI (aortic insufficiency)    Arthritis    CHF (congestive heart failure) (HCC)    Chronic kidney disease    Glaucoma    Gout    H/O exercise stress test    Myoview 01/03/12: No scar or ischemia, EF 53%   Hyperlipidemia    Hypertension    Hypertensive cardiomyopathy (HCC)    EF previously 35%;  echo 12/2012 EF 55%, mild AI, ascending aorta mildly dilated, mild LAE    Patient Active Problem List   Diagnosis Date Noted   Type II diabetes mellitus with nephropathy (HCC) 03/08/2022   Benign prostatic hyperplasia without lower urinary tract symptoms 03/08/2022   Need for vaccination 03/08/2022   Unilateral primary osteoarthritis, right hip 08/26/2021   / 07/23/2021   Chronic pain of right hip 02/22/2021   Grade I diastolic dysfunction 10/28/2019   Bradycardia 10/06/2019   Vitamin D deficiency 09/06/2017   CKD (chronic kidney disease) stage 3, GFR 30-59 ml/min (HCC) 08/07/2014   Insomnia 02/14/2014   Erectile dysfunction 02/27/2013   Papular atopic dermatitis 05/16/2012   Gout 02/16/2012   Routine general medical examination at a health care facility 02/16/2012   Hyperlipidemia with target LDL less than 100 02/16/2012   Aortic regurgitation 02/16/2012   Colon polyps 02/16/2012   Essential hypertension, benign 03/07/2009   Hypertensive heart disease without heart failure  03/07/2009   COMBINED HEART FAILURE, CHRONIC 03/07/2009    Past Surgical History:  Procedure Laterality Date   COLONOSCOPY     CYST REMOVAL HAND     HERNIA REPAIR     Umbilical   MASS EXCISION Left 08/04/2020   Procedure: EXCISION MASS LEFT PALM;  Surgeon: Cindee Salt, MD;  Location: [facility];  Service: Orthopedics;  Laterality: Left;   POLYPECTOMY     TOTAL HIP ARTHROPLASTY Right 08/27/2021   Procedure: Right HIP ARTHROPLASTY ANTERIOR APPROACH;  Surgeon: Kathryne Hitch, MD;  Location: [facility];  Service: Orthopedics;  Laterality: Right;   WISDOM TOOTH EXTRACTION     WRIST SURGERY     right       Home Medications    Prior to Admission medications   Medication Sig Start Date End Date Taking? Authorizing Provider  benzonatate (TESSALON) 100 MG capsule Take 1 capsule (100 mg total) by mouth every 8 (eight) hours as needed for cough. 12/09/22  Yes , Rolly Salter E, FNP  fluticasone (FLONASE) 50 MCG/ACT nasal spray Place 1 spray into both nostrils daily. 12/09/22  Yes , Rolly Salter E, FNP  phenol (CHLORASEPTIC) 1.4 % LIQD Use as directed 1 spray in the mouth or throat as needed for throat irritation / pain. 12/09/22  Yes , Acie Fredrickson, FNP  allopurinol (ZYLOPRIM) 100 MG tablet TAKE TWO TABLETS BY MOUTH DAILY 12/02/22   Etta Grandchild, MD  Cholecalciferol (VITAMIN D3) 50 MCG (2000 UT) capsule TAKE 1 CAPSULE BY MOUTH EVERY DAY 01/03/21   Etta Grandchild, MD  colchicine 0.6 MG tablet Take 1 tablet (0.6 mg total) by mouth daily. When having flare Patient taking differently: Take 0.6 mg by mouth daily as needed. When having flare 02/15/21   Etta Grandchild, MD  dapagliflozin propanediol (FARXIGA) 10 MG TABS tablet Take 1 tablet (10 mg total) by mouth daily before breakfast. 11/24/22   Etta Grandchild, MD  pravastatin (PRAVACHOL) 20 MG tablet TAKE ONE TABLET BY MOUTH DAILY 12/02/22   Etta Grandchild, MD  sacubitril-valsartan (ENTRESTO) 24-26 MG TAKE 1 TABLET BY MOUTH TWICE A DAY  11/24/22   Etta Grandchild, MD  sildenafil (REVATIO) 20 MG tablet Take 4 tablets (80 mg total) by mouth daily as needed. 12/05/22   Etta Grandchild, MD  SIMBRINZA 1-0.2 % SUSP Place 1 drop into both eyes in the morning and at bedtime. 01/08/18   [provider]  spironolactone (ALDACTONE) 25 MG tablet TAKE ONE TABLET BY MOUTH DAILY 08/15/22   Etta Grandchild, MD  timolol (TIMOPTIC) 0.5 % ophthalmic solution Place 1 drop into both eyes 2 (two) times daily. 01/04/18   [provider]  torsemide (DEMADEX) 20 MG tablet Take 1 tablet (20 mg total) by mouth daily. 05/13/22   Etta Grandchild, MD  Travoprost, BAK Free, (TRAVATAN) 0.004 % SOLN ophthalmic solution Place 1 drop into both eyes at bedtime.     [provider]  zolpidem (AMBIEN) 5 MG tablet TAKE ONE TABLET BY MOUTH AT BEDTIME AS NEEDED FOR SLEEP 12/06/22   Etta Grandchild, MD    Family History Family History  Problem Relation Age of Onset   Hypertension Father    Colon cancer Father 73   Hyperlipidemia Father    Heart disease Father    Diabetes Father    Stroke Mother        deceased   Colon cancer Mother 30   Arthritis Mother    Hyperlipidemia Mother    Hypertension Mother    Diabetes Mother    Colon polyps Sister    Colon polyps Brother    COPD Neg Hx    Alcohol abuse Neg Hx    Kidney disease Neg Hx    Esophageal cancer Neg Hx    Rectal cancer Neg Hx    Stomach cancer Neg Hx     Social History Social History   Tobacco Use   Smoking status: Former    Packs/day: 1.00    Years: 2.00    Additional pack years: 0.00    Total pack years: 2.00    Types: Cigarettes    Quit date: 01/10/1975    Years since quitting: 47.9   Smokeless tobacco: Never   Tobacco comments:    Only smoked two years  Vaping Use   Vaping Use: Never used  Substance Use Topics   Alcohol use: No   Drug use: No     Allergies   Patient has no known allergies.   Review of Systems Review of Systems Per HPI  Physical  Exam Triage Vital Signs ED Triage Vitals  Enc Vitals Group     BP 12/09/22 1413 98/63     Pulse Rate 12/09/22 1411 (!) 58     Resp 12/09/22 1411 16     Temp 12/09/22 1411 97.8 F (36.6 C)     Temp Source 12/09/22 1411 Oral  SpO2 12/09/22 1411 95 %     Weight --      Height --      Head Circumference --      Peak Flow --      Pain Score 12/09/22 1412 7     Pain Loc --      Pain Edu? --      Excl. in GC? --    No data found.  Updated Vital Signs BP 98/63 (BP Location: Left Arm)   Pulse (!) 58   Temp 97.8 F (36.6 C) (Oral)   Resp 16   SpO2 95%   Visual Acuity Right Eye Distance:   Left Eye Distance:   Bilateral Distance:    Right Eye Near:   Left Eye Near:    Bilateral Near:     Physical Exam Constitutional:      General: He is not in acute distress.    Appearance: Normal appearance. He is not toxic-appearing or diaphoretic.  HENT:     Head: Normocephalic and atraumatic.     Right Ear: Tympanic membrane and ear canal normal.     Left Ear: Tympanic membrane and ear canal normal.     Nose: Congestion present.     Mouth/Throat:     Mouth: Mucous membranes are moist.     Pharynx: Posterior oropharyngeal erythema present.  Eyes:     Extraocular Movements: Extraocular movements intact.     Conjunctiva/sclera: Conjunctivae normal.     Pupils: Pupils are equal, round, and reactive to light.  Cardiovascular:     Rate and Rhythm: Normal rate and regular rhythm.     Pulses: Normal pulses.     Heart sounds: Normal heart sounds.  Pulmonary:     Effort: Pulmonary effort is normal. No respiratory distress.     Breath sounds: Normal breath sounds. No stridor. No wheezing, rhonchi or rales.  Abdominal:     General: Abdomen is flat. Bowel sounds are normal.     Palpations: Abdomen is soft.  Musculoskeletal:        General: Normal range of motion.     Cervical back: Normal range of motion.  Skin:    General: Skin is warm and dry.  Neurological:     General: No  focal deficit present.     Mental Status: He is alert and oriented to person, place, and time. Mental status is at baseline.  Psychiatric:        Mood and Affect: Mood normal.        Behavior: Behavior normal.      UC Treatments / Results  Labs (all labs ordered are listed, but only abnormal results are displayed) Labs Reviewed - No data to display  EKG   Radiology No results found.  Procedures Procedures (including critical care time)  Medications Ordered in UC Medications - No data to display  Initial Impression / Assessment and Plan / UC Course  I have reviewed the triage vital signs and the nursing notes.  Pertinent labs & imaging results that were available during my care of the patient were reviewed by me and considered in my medical decision making (see chart for details).     Patient presents with symptoms likely from a viral upper respiratory infection.  Do not suspect underlying cardiopulmonary process. Symptoms seem unlikely related to ACS, CHF or COPD exacerbations, pneumonia, pneumothorax. Patient is nontoxic appearing and not in need of emergent medical intervention.  There are no adventitious lung sounds on exam so  do not think that chest imaging is necessary.  Patient declined COVID testing.  Do not think rapid strep testing is necessary given appearance of posterior pharynx on exam.  Recommended symptom control with medications and supportive care.  Patient was sent Flonase, Chloraseptic spray, benzonatate.  Return if symptoms fail to improve in 1-2 weeks or you develop shortness of breath, chest pain, severe headache. Patient states understanding and is agreeable.  Discharged with PCP followup.  Final Clinical Impressions(s) / UC Diagnoses   Final diagnoses:  Viral upper respiratory tract infection with cough     Discharge Instructions      It appears that you have a viral illness that should run its course.  I have prescribed you 3 medications to  help alleviate your symptoms.  Follow-up with any symptoms persist or worsen.    ED Prescriptions     Medication Sig Dispense Auth. Provider   fluticasone (FLONASE) 50 MCG/ACT nasal spray Place 1 spray into both nostrils daily. 16 g , Rolly Salter E, [place]   benzonatate (TESSALON) 100 MG capsule Take 1 capsule (100 mg total) by mouth every 8 (eight) hours as needed for cough. 21 capsule [place], Robinette E, [place]   phenol (CHLORASEPTIC) 1.4 % LIQD Use as directed 1 spray in the mouth or throat as needed for throat irritation / pain. 118 mL Gustavus Bryant, [place]      PDMP not reviewed this encounter.   Gustavus Bryant, [place] 12/09/22 [phone]

## 2022-12-09 NOTE — ED Triage Notes (Signed)
Pt states left throat pain and left  ear fullness for the past 3 days. States he has not taken anything at home for his symptoms.

## 2022-12-09 NOTE — Discharge Instructions (Signed)
It appears that you have a viral illness that should run its course.  I have prescribed you 3 medications to help alleviate your symptoms.  Follow-up with any symptoms persist or worsen.

## 2022-12-21 LAB — HM DIABETES EYE EXAM

## 2023-01-02 ENCOUNTER — Other Ambulatory Visit: Payer: Self-pay | Admitting: Internal Medicine

## 2023-01-02 DIAGNOSIS — I10 Essential (primary) hypertension: Secondary | ICD-10-CM

## 2023-01-02 DIAGNOSIS — I5042 Chronic combined systolic (congestive) and diastolic (congestive) heart failure: Secondary | ICD-10-CM

## 2023-01-11 ENCOUNTER — Other Ambulatory Visit: Payer: Self-pay | Admitting: Internal Medicine

## 2023-01-11 DIAGNOSIS — F5104 Psychophysiologic insomnia: Secondary | ICD-10-CM

## 2023-01-19 ENCOUNTER — Other Ambulatory Visit: Payer: Self-pay | Admitting: Internal Medicine

## 2023-01-19 DIAGNOSIS — I5042 Chronic combined systolic (congestive) and diastolic (congestive) heart failure: Secondary | ICD-10-CM

## 2023-01-27 ENCOUNTER — Ambulatory Visit: Payer: BC Managed Care – PPO | Admitting: Cardiology

## 2023-02-01 NOTE — Progress Notes (Unsigned)
  Cardiology Office Note:   Date:  02/02/2023  ID:  John Jock., DOB [birth year], MRN [id number] PCP: Etta Grandchild, MD  [facility] Providers Cardiologist:  Rollene Rotunda, MD {  History of Present Illness:   John Garnier. is a 72 y.o. male who presents for followup of aortic insufficiency. He has had previously reduced ejection fraction but it has been improved at followup 2021.  Since I last saw him he has done well.  I do see that his creatinine is up to 3.04.  His blood pressure is low but he feels fine.  He denies any cardiovascular symptoms.  His nephrologist did reduce his torsemide every other day.  He denies any new shortness of breath, PND or orthopnea.  There is no palpitations, presyncope or syncope.  He does a little walking at work.  ROS: As stated in the HPI and negative for all other systems.  Studies Reviewed:    EKG:   NA  Risk Assessment/Calculations:      Physical Exam:   VS:  BP 90/60   Pulse (!) 50   Ht 5\' 11"  (1.803 m)   Wt 228 lb 3.2 oz (103.5 kg)   BMI 31.83 kg/m    Wt Readings from Last 3 Encounters:  02/02/23 228 lb 3.2 oz (103.5 kg)  11/24/22 226 lb (102.5 kg)  03/07/22 220 lb (99.8 kg)     GEN: Well nourished, well developed in no acute distress NECK: No JVD; No carotid bruits CARDIAC: RRR, soft apical systolic murmurs, rubs, gallops RESPIRATORY:  Clear to auscultation without rales, wheezing or rhonchi  ABDOMEN: Soft, non-tender, non-distended EXTREMITIES:  No edema; No deformity   ASSESSMENT AND PLAN:   Aortic insufficiency: I will check an echocardiogram this year as he had mild to moderate AI 3 years ago.  No change in therapy otherwise.  Ao root enlargement: This can be assessed at the time of his echo.   HTN : His blood pressure is running low.  However, he tolerates this.  No change in therapy.   Dyslipidemia : LDL is mildly elevated.  This is 113.  And then increase pravastatin to 40.   CKD:  His creatinine is up to  3.04 but he has close follow-up with nephrology.  I can discuss with them if they think he needs to have reduction in any of his meds.   Follow up with me in one year.  Signed, Rollene Rotunda, MD

## 2023-02-02 ENCOUNTER — Encounter: Payer: Self-pay | Admitting: Cardiology

## 2023-02-02 ENCOUNTER — Ambulatory Visit: Payer: BC Managed Care – PPO | Attending: Cardiology | Admitting: Cardiology

## 2023-02-02 VITALS — BP 90/60 | HR 50 | Ht 71.0 in | Wt 228.2 lb

## 2023-02-02 DIAGNOSIS — I351 Nonrheumatic aortic (valve) insufficiency: Secondary | ICD-10-CM

## 2023-02-02 DIAGNOSIS — E785 Hyperlipidemia, unspecified: Secondary | ICD-10-CM | POA: Diagnosis not present

## 2023-02-02 DIAGNOSIS — I10 Essential (primary) hypertension: Secondary | ICD-10-CM | POA: Diagnosis not present

## 2023-02-02 MED ORDER — PRAVASTATIN SODIUM 40 MG PO TABS
40.0000 mg | ORAL_TABLET | Freq: Every day | ORAL | 3 refills | Status: DC
Start: 2023-02-02 — End: 2023-07-04

## 2023-02-02 NOTE — Patient Instructions (Addendum)
INCREASE PRAVASTATIN TO 40 MG ONCE DAILY= 2 OF THE 20 MG TABLETS ONCE DAILY  Testing/Procedures:  Your physician has requested that you have an echocardiogram. Echocardiography is a painless test that uses sound waves to create images of your heart. It provides your doctor with information about the size and shape of your heart and how well your heart's chambers and valves are working. This procedure takes approximately one hour. There are no restrictions for this procedure. Please do NOT wear cologne, perfume, aftershave, or lotions (deodorant is allowed). Please arrive 15 minutes prior to your appointment time. [address]   Follow-Up: At [facility], you and your health needs are our priority.  As part of our continuing mission to provide you with exceptional heart care, we have created designated Provider Care Teams.  These Care Teams include your primary Cardiologist (physician) and Advanced Practice Providers (APPs -  Physician Assistants and Nurse Practitioners) who all work together to provide you with the care you need, when you need it.  We recommend signing up for the patient portal called "MyChart".  Sign up information is provided on this After Visit Summary.  MyChart is used to connect with patients for Virtual Visits (Telemedicine).  Patients are able to view lab/test results, encounter notes, upcoming appointments, etc.  Non-urgent messages can be sent to your provider as well.   To learn more about what you can do with MyChart, go to [email or web address].    Your next appointment:   12 month(s)  Provider:   Rollene Rotunda, MD

## 2023-02-08 ENCOUNTER — Other Ambulatory Visit: Payer: Self-pay | Admitting: Internal Medicine

## 2023-02-08 DIAGNOSIS — F5104 Psychophysiologic insomnia: Secondary | ICD-10-CM

## 2023-02-12 ENCOUNTER — Other Ambulatory Visit: Payer: Self-pay | Admitting: Internal Medicine

## 2023-02-20 ENCOUNTER — Ambulatory Visit (HOSPITAL_COMMUNITY): Payer: BC Managed Care – PPO | Attending: Cardiology

## 2023-02-20 DIAGNOSIS — I351 Nonrheumatic aortic (valve) insufficiency: Secondary | ICD-10-CM | POA: Diagnosis present

## 2023-02-20 LAB — ECHOCARDIOGRAM COMPLETE
Area-P 1/2: 2.62 cm2
P 1/2 time: 407 msec
S' Lateral: 3.8 cm

## 2023-02-22 IMAGING — XA DG HIP (WITH OR WITHOUT PELVIS) 1V*R*
1 series · 3 of 3 positions shown · non-contrast
Comparison: 02/22/2021

CLINICAL DATA: Status post right hip arthroplasty

EXAM:
DG HIP (WITH OR WITHOUT PELVIS) 1V RIGHT

[Series 1: unknown protocol · 3 of 3 slices shown]
[im 1/3]
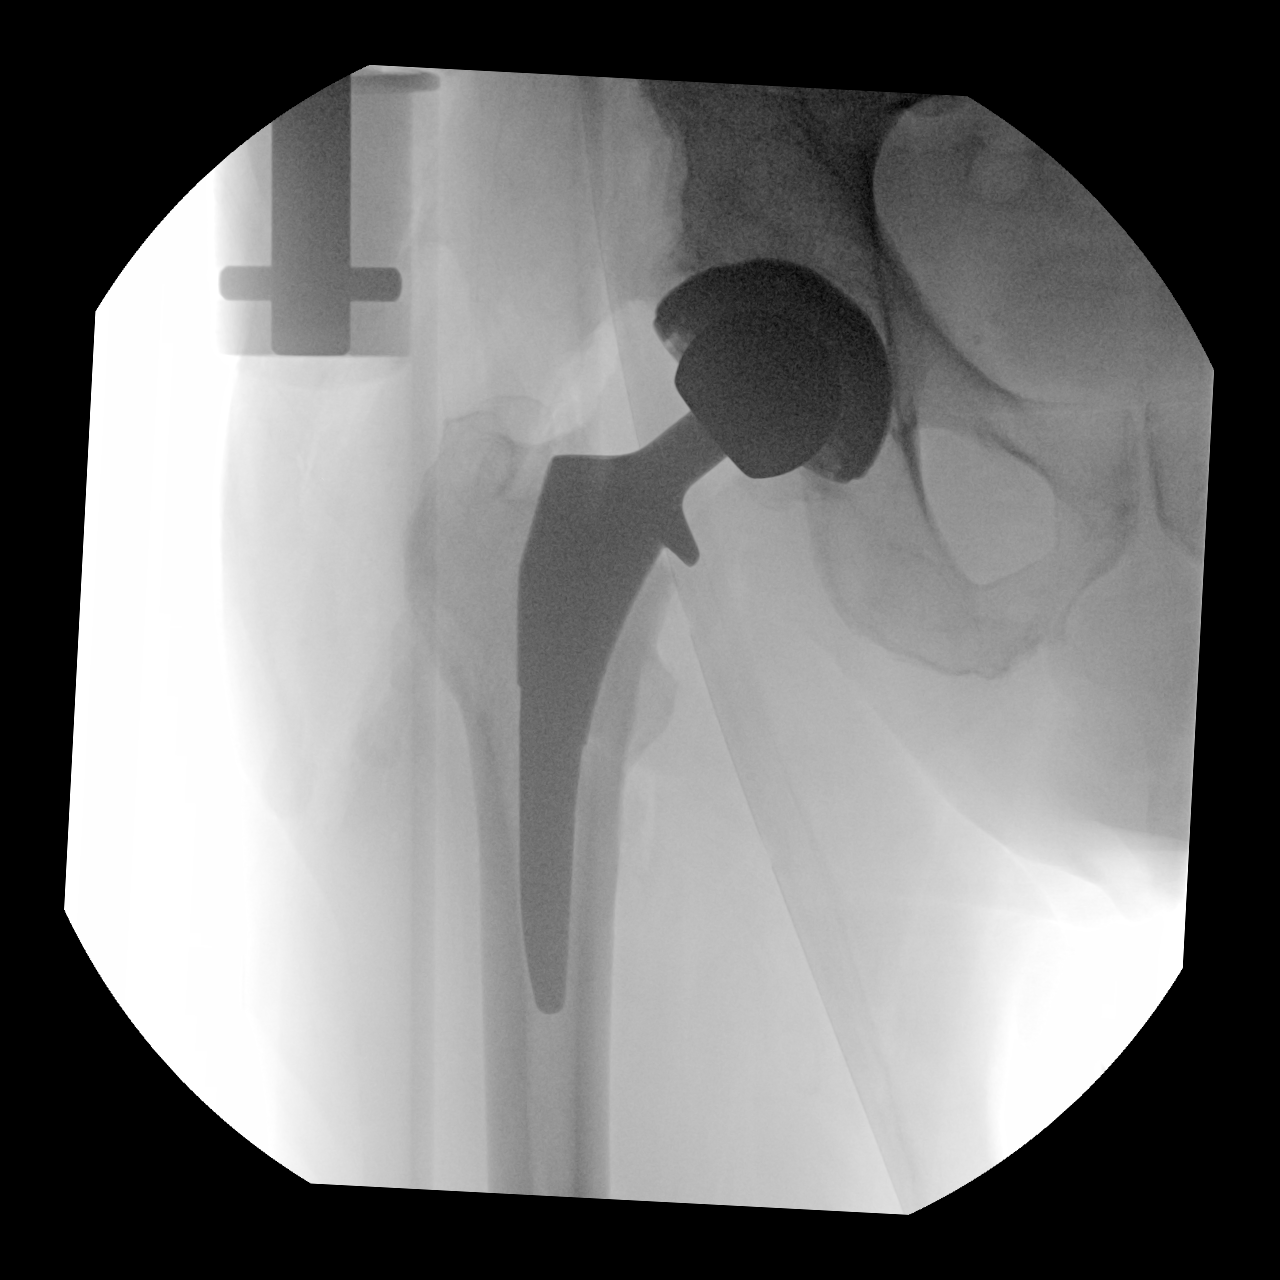
[im 2/3]
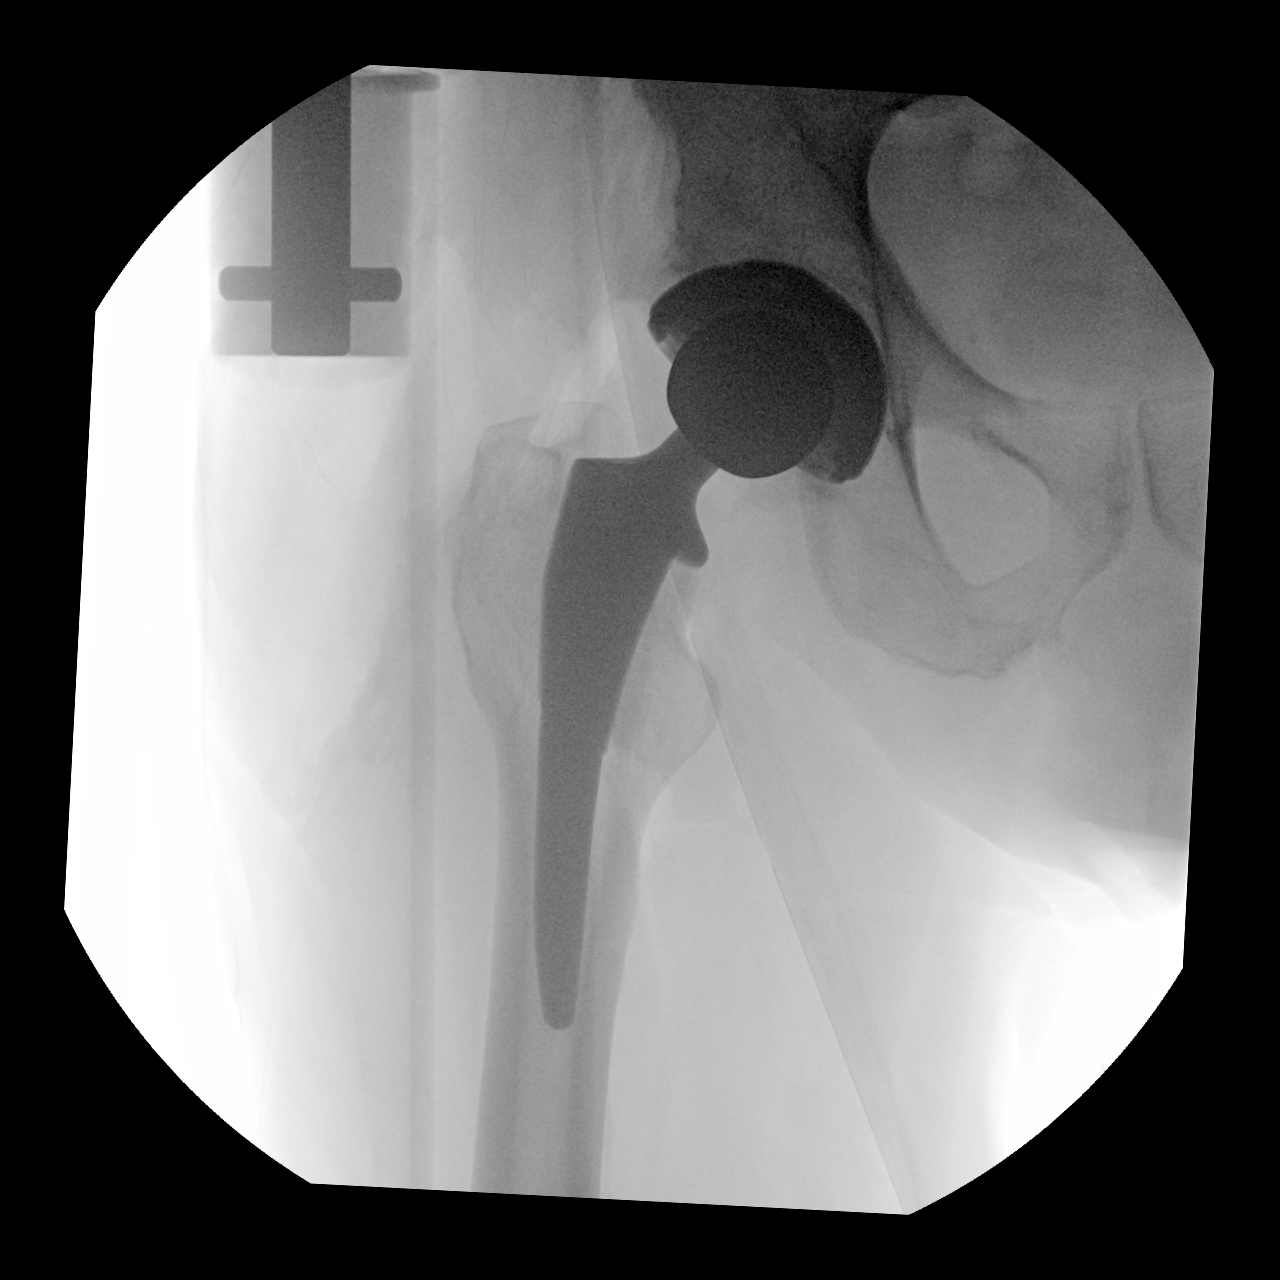
[im 3/3]
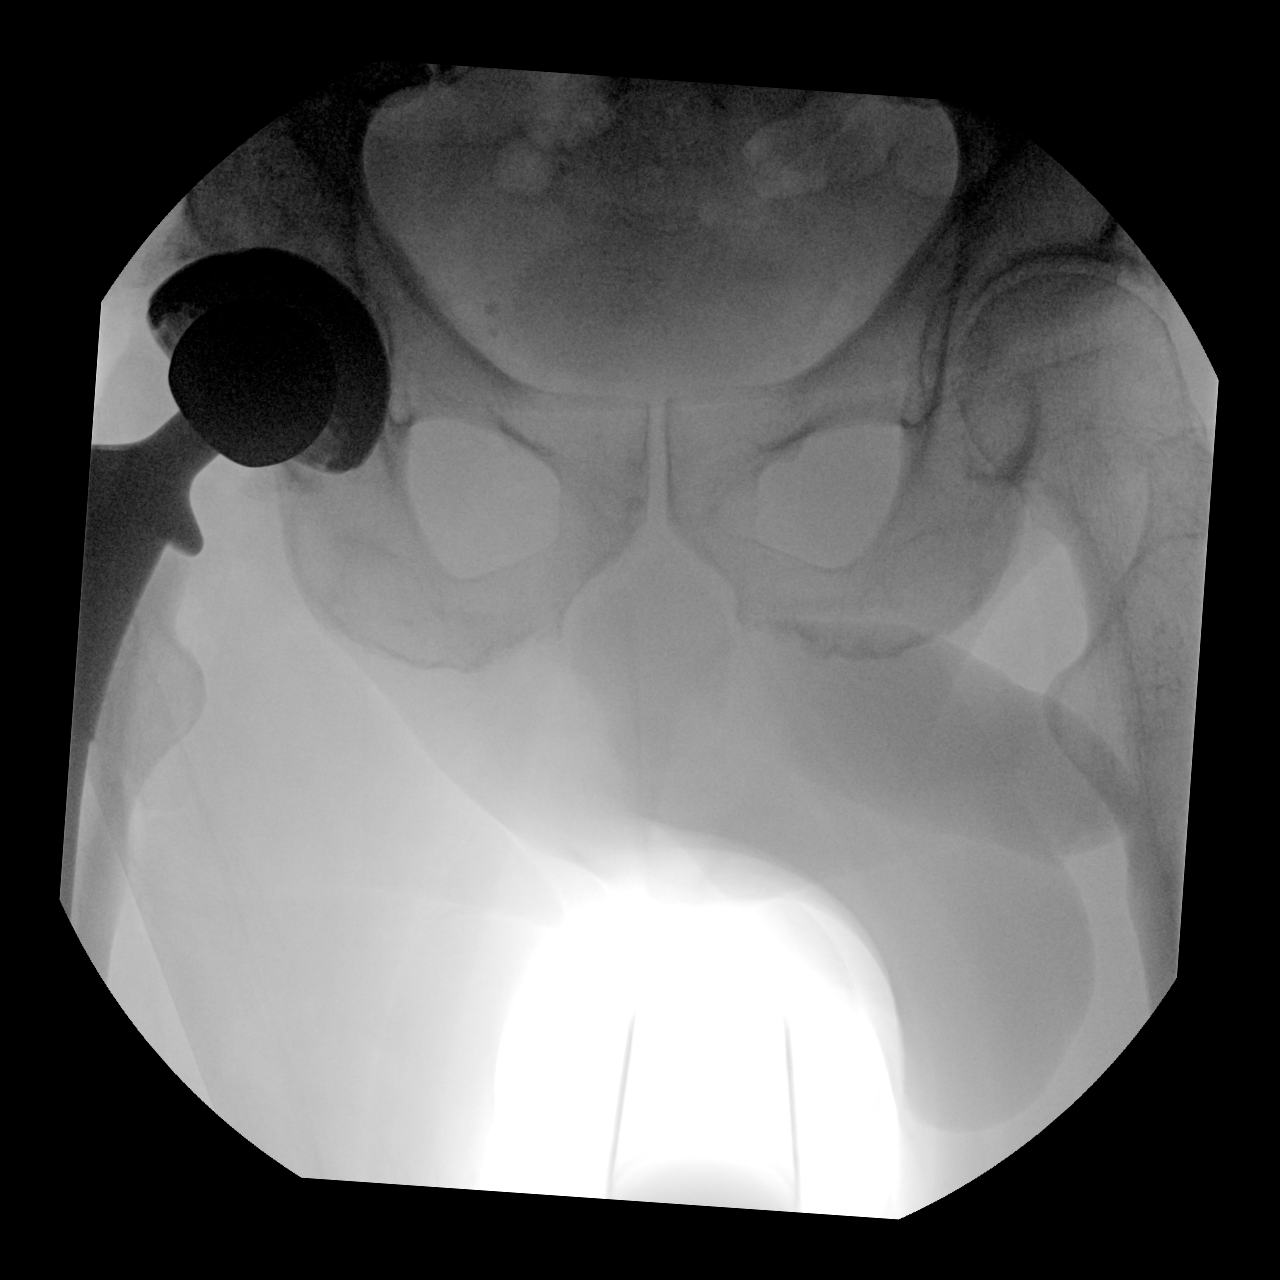

[3 of 3 positions shown; findings below may reference images not displayed]

FINDINGS: Three images from portable C-arm radiography obtained in the
operating room show changes of right total hip arthroplasty. No
signs of periprosthetic fracture or subluxation.
IMPRESSION: Status post right total hip arthroplasty.

## 2023-03-02 ENCOUNTER — Other Ambulatory Visit: Payer: Self-pay | Admitting: Internal Medicine

## 2023-04-04 ENCOUNTER — Other Ambulatory Visit: Payer: Self-pay | Admitting: Internal Medicine

## 2023-04-04 DIAGNOSIS — I5042 Chronic combined systolic (congestive) and diastolic (congestive) heart failure: Secondary | ICD-10-CM

## 2023-04-04 DIAGNOSIS — I10 Essential (primary) hypertension: Secondary | ICD-10-CM

## 2023-04-05 ENCOUNTER — Other Ambulatory Visit: Payer: Self-pay | Admitting: Internal Medicine

## 2023-04-05 DIAGNOSIS — F5104 Psychophysiologic insomnia: Secondary | ICD-10-CM

## 2023-05-07 ENCOUNTER — Ambulatory Visit
Admission: EM | Admit: 2023-05-07 | Discharge: 2023-05-07 | Disposition: A | Discharge status: Home / Self Care | Payer: BC Managed Care – PPO | Attending: Internal Medicine | Admitting: Internal Medicine

## 2023-05-07 DIAGNOSIS — M79671 Pain in right foot: Secondary | ICD-10-CM | POA: Diagnosis not present

## 2023-05-07 DIAGNOSIS — M79672 Pain in left foot: Secondary | ICD-10-CM | POA: Diagnosis not present

## 2023-05-07 DIAGNOSIS — E119 Type 2 diabetes mellitus without complications: Secondary | ICD-10-CM

## 2023-05-07 NOTE — Discharge Instructions (Signed)
I do not see any ulcers on your legs. How recommend you follow-up with the podiatrist for footcare Please do not cut your own toenails.
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
## 2023-05-07 NOTE — ED Provider Notes (Signed)
[facility]    CSN: [id number] Arrival date & time: 05/07/23  1207      History   Chief Complaint Chief Complaint  Patient presents with   Toe Pain    Right    HPI John Hogan. is a 72 y.o. male with a history of diabetes mellitus type 2 (last hemoglobin A1c was 6.  7).  Patient comes to urgent care requesting foot examination.  No ulcerations on the foot.  No numbness or tingling.Marland Kitchen   HPI  Past Medical History:  Diagnosis Date   AI (aortic insufficiency)    Arthritis    CHF (congestive heart failure) (HCC)    Chronic kidney disease    Glaucoma    Gout    H/O exercise stress test    Myoview 01/03/12: No scar or ischemia, EF 53%   Hyperlipidemia    Hypertension    Hypertensive cardiomyopathy (HCC)    EF previously 35%;  echo 12/2012 EF 55%, mild AI, ascending aorta mildly dilated, mild LAE    Patient Active Problem List   Diagnosis Date Noted   Type II diabetes mellitus with nephropathy (HCC) 03/08/2022   Benign prostatic hyperplasia without lower urinary tract symptoms 03/08/2022   Need for vaccination 03/08/2022   Unilateral primary osteoarthritis, right hip 08/26/2021   / 07/23/2021   Chronic pain of right hip 02/22/2021   Grade I diastolic dysfunction 10/28/2019   Bradycardia 10/06/2019   Vitamin D deficiency 09/06/2017   CKD (chronic kidney disease) stage 3, GFR 30-59 ml/min (HCC) 08/07/2014   Insomnia 02/14/2014   Erectile dysfunction 02/27/2013   Papular atopic dermatitis 05/16/2012   Gout 02/16/2012   Routine general medical examination at a health care facility 02/16/2012   Hyperlipidemia with target LDL less than 100 02/16/2012   Aortic regurgitation 02/16/2012   Colon polyps 02/16/2012   Essential hypertension, benign 03/07/2009   Hypertensive heart disease without heart failure 03/07/2009   COMBINED HEART FAILURE, CHRONIC 03/07/2009    Past Surgical History:  Procedure Laterality Date   COLONOSCOPY     CYST REMOVAL HAND      HERNIA REPAIR     Umbilical   MASS EXCISION Left 08/04/2020   Procedure: EXCISION MASS LEFT PALM;  Surgeon: Cindee Salt, MD;  Location: [facility];  Service: Orthopedics;  Laterality: Left;   POLYPECTOMY     TOTAL HIP ARTHROPLASTY Right 08/27/2021   Procedure: Right HIP ARTHROPLASTY ANTERIOR APPROACH;  Surgeon: Kathryne Hitch, MD;  Location: [facility];  Service: Orthopedics;  Laterality: Right;   WISDOM TOOTH EXTRACTION     WRIST SURGERY     right       Home Medications    Prior to Admission medications   Medication Sig Start Date End Date Taking? Authorizing Provider  allopurinol (ZYLOPRIM) 100 MG tablet TAKE TWO TABLETS BY MOUTH DAILY Patient taking differently: Take 100 mg by mouth daily. 12/02/22  Yes Etta Grandchild, MD  dapagliflozin propanediol (FARXIGA) 10 MG TABS tablet Take 1 tablet (10 mg total) by mouth daily before breakfast. 11/24/22  Yes Etta Grandchild, MD  ENTRESTO 24-26 MG TAKE 1 TABLET BY MOUTH TWICE A DAY 01/19/23  Yes Etta Grandchild, MD  SIMBRINZA 1-0.2 % SUSP Place 1 drop into both eyes in the morning and at bedtime. 01/08/18  Yes [provider]  spironolactone (ALDACTONE) 25 MG tablet TAKE ONE TABLET BY MOUTH DAILY 08/15/22  Yes Etta Grandchild, MD  Timolol Maleate, Once-Daily, 0.5 %  SOLN Apply 1 drop to eye 2 (two) times daily. 03/01/23  Yes [provider]  zolpidem (AMBIEN) 5 MG tablet TAKE ONE TABLET BY MOUTH AT BEDTIME AS NEEDED FOR SLEEP 02/08/23  Yes Etta Grandchild, MD  Cholecalciferol (VITAMIN D3) 50 MCG (2000 UT) capsule TAKE 1 CAPSULE BY MOUTH EVERY DAY 01/03/21   Etta Grandchild, MD  pravastatin (PRAVACHOL) 20 MG tablet Take 20 mg by mouth daily. 04/04/23   [provider]  pravastatin (PRAVACHOL) 40 MG tablet Take 1 tablet (40 mg total) by mouth daily. 02/02/23   Rollene Rotunda, MD  sildenafil (REVATIO) 20 MG tablet Take 4 tablets (80 mg total) by mouth daily as needed. 12/05/22   Etta Grandchild, MD  timolol  (TIMOPTIC) 0.5 % ophthalmic solution Place 1 drop into both eyes 2 (two) times daily. 01/04/18   [provider]  torsemide (DEMADEX) 20 MG tablet TAKE ONE TABLET BY MOUTH DAILY Patient taking differently: Take 20 mg by mouth every other day. 01/02/23   Etta Grandchild, MD  Travoprost, BAK Free, (TRAVATAN) 0.004 % SOLN ophthalmic solution Place 1 drop into both eyes at bedtime.     [provider]    Family History Family History  Problem Relation Age of Onset   Hypertension Father    Colon cancer Father 50   Hyperlipidemia Father    Heart disease Father    Diabetes Father    Stroke Mother        deceased   Colon cancer Mother 24   Arthritis Mother    Hyperlipidemia Mother    Hypertension Mother    Diabetes Mother    Colon polyps Sister    Colon polyps Brother    COPD Neg Hx    Alcohol abuse Neg Hx    Kidney disease Neg Hx    Esophageal cancer Neg Hx    Rectal cancer Neg Hx    Stomach cancer Neg Hx     Social History Social History   Tobacco Use   Smoking status: Former    Current packs/day: 0.00    Average packs/day: 1 pack/day for 2.0 years (2.0 ttl pk-yrs)    Types: Cigarettes    Start date: 01/09/1973    Quit date: 01/10/1975    Years since quitting: 48.3   Smokeless tobacco: Never   Tobacco comments:    Only smoked two years  Vaping Use   Vaping status: Never Used  Substance Use Topics   Alcohol use: No   Drug use: No     Allergies   Patient has no known allergies.   Review of Systems Review of Systems As per HPI  Physical Exam Triage Vital Signs ED Triage Vitals  Encounter Vitals Group     BP 05/07/23 1215 111/63     Systolic BP Percentile --      Diastolic BP Percentile --      Pulse Rate 05/07/23 1215 61     Resp 05/07/23 1215 18     Temp 05/07/23 1215 (!) 97.4 F (36.3 C)     Temp Source 05/07/23 1215 Oral     SpO2 05/07/23 1215 97 %     Weight 05/07/23 1213 224 lb (101.6 kg)     Height 05/07/23 1213 5\' 11"  (1.803 m)      Head Circumference --      Peak Flow --      Pain Score 05/07/23 1209 0     Pain Loc --  Pain Education --      Exclude from Growth Chart --    No data found.  Updated Vital Signs BP 111/63 (BP Location: Left Arm)   Pulse 61   Temp (!) 97.4 F (36.3 C) (Oral)   Resp 18   Ht 5\' 11"  (1.803 m)   Wt 101.6 kg   SpO2 97%   BMI 31.24 kg/m   Visual Acuity Right Eye Distance:   Left Eye Distance:   Bilateral Distance:    Right Eye Near:   Left Eye Near:    Bilateral Near:     Physical Exam Vitals and nursing note reviewed.  Constitutional:      General: He is not in acute distress.    Appearance: He is not ill-appearing.  Cardiovascular:     Rate and Rhythm: Normal rate and regular rhythm.  Musculoskeletal:        General: Normal range of motion.     Comments: No ulcerations on the feet.  Sensation is intact.  Neurological:     Mental Status: He is alert.      UC Treatments / Results  Labs (all labs ordered are listed, but only abnormal results are displayed) Labs Reviewed - No data to display  EKG   Radiology No results found.  Procedures Procedures (including critical care time)  Medications Ordered in UC Medications - No data to display  Initial Impression / Assessment and Plan / UC Course  I have reviewed the triage vital signs and the nursing notes.  Pertinent labs & imaging results that were available during my care of the patient were reviewed by me and considered in my medical decision making (see chart for details).     1.  Bilateral foot pain with no ulcerations: Patient is advised to follow-up with the podiatrist for further evaluation Return precautions given. Final Clinical Impressions(s) / UC Diagnoses   Final diagnoses:  Foot pain, bilateral  Diabetes mellitus type 2 in nonobese [facility])     Discharge Instructions      I do not see any ulcers on your legs. How recommend you follow-up with the podiatrist for footcare Please  do not cut your own toenails.   ED Prescriptions   None    PDMP not reviewed this encounter.   Merrilee Jansky, MD 05/07/23 [phone]

## 2023-05-09 ENCOUNTER — Encounter: Payer: Self-pay | Admitting: Podiatry

## 2023-05-09 ENCOUNTER — Ambulatory Visit (INDEPENDENT_AMBULATORY_CARE_PROVIDER_SITE_OTHER): Payer: BC Managed Care – PPO | Admitting: Podiatry

## 2023-05-09 DIAGNOSIS — E1121 Type 2 diabetes mellitus with diabetic nephropathy: Secondary | ICD-10-CM

## 2023-05-09 DIAGNOSIS — N1831 Chronic kidney disease, stage 3a: Secondary | ICD-10-CM

## 2023-05-09 DIAGNOSIS — L600 Ingrowing nail: Secondary | ICD-10-CM | POA: Insufficient documentation

## 2023-05-09 NOTE — Progress Notes (Signed)
This patient presents to my office for at risk foot care.  This patient requires this care by a professional since this patient will be at risk due to having diabetic with CKD. This patient has experienced pain in the outside border right big toe since his nails were done at the salon.  He denies drainage at the ingrown nail site.These nails are painful walking and wearing shoes.  This patient presents for at risk foot care today.  General Appearance  Alert, conversant and in no acute stress.  Vascular  Dorsalis pedis and posterior tibial  pulses are palpable  right.  Dorsalis pedis and posterior tibial pulses are weakly palpable.  Capillary return is within normal limits  bilaterally. Temperature is within normal limits  bilaterally.  Neurologic  Senn-Weinstein monofilament wire test within normal limits  bilaterally. Muscle power within normal limits bilaterally.  Nails Thick disfigured discolored nails with subungual debris  hallux nails bilaterally. No evidence of bacterial infection or drainage bilaterally.  Orthopedic  No limitations of motion  feet .  No crepitus or effusions noted.  No bony pathology or digital deformities noted.  Skin  normotropic skin with no porokeratosis noted bilaterally.  No signs of infections or ulcers noted.     Onychomycosis  Pain in right toes  Pain in left toes  Consent was obtained for treatment procedures.   Mechanical debridement of nails 1-5  bilaterally performed with a nail nipper.  Filed with dremel without incident.    Return office visit   3 months                   Told patient to return for periodic foot care and evaluation due to potential at risk complications.    Helane Gunther DPM

## 2023-05-12 LAB — BASIC METABOLIC PANEL
BUN: 31 — AB (ref 4–21)
CO2: 24 — AB (ref 13–22)
Chloride: 102 (ref 99–108)
Creatinine: 2.5 — AB (ref 0.6–1.3)
Glucose: 87
Potassium: 4.5 meq/L (ref 3.5–5.1)
Sodium: 142 (ref 137–147)

## 2023-05-12 LAB — COMPREHENSIVE METABOLIC PANEL
Albumin: 4.4 (ref 3.5–5.0)
Calcium: 9.5 (ref 8.7–10.7)
eGFR: 27

## 2023-05-21 ENCOUNTER — Other Ambulatory Visit: Payer: Self-pay | Admitting: Internal Medicine

## 2023-05-21 DIAGNOSIS — I5042 Chronic combined systolic (congestive) and diastolic (congestive) heart failure: Secondary | ICD-10-CM

## 2023-06-06 ENCOUNTER — Other Ambulatory Visit: Payer: Self-pay | Admitting: Internal Medicine

## 2023-06-06 DIAGNOSIS — M1000 Idiopathic gout, unspecified site: Secondary | ICD-10-CM

## 2023-06-19 ENCOUNTER — Encounter: Payer: Self-pay | Admitting: Internal Medicine

## 2023-06-19 ENCOUNTER — Ambulatory Visit (INDEPENDENT_AMBULATORY_CARE_PROVIDER_SITE_OTHER): Payer: BC Managed Care – PPO | Admitting: Internal Medicine

## 2023-06-19 VITALS — BP 120/76 | HR 49 | Temp 98.3°F | Resp 16 | Ht 71.0 in | Wt 228.0 lb

## 2023-06-19 DIAGNOSIS — F5104 Psychophysiologic insomnia: Secondary | ICD-10-CM

## 2023-06-19 DIAGNOSIS — I119 Hypertensive heart disease without heart failure: Secondary | ICD-10-CM | POA: Diagnosis not present

## 2023-06-19 DIAGNOSIS — E1121 Type 2 diabetes mellitus with diabetic nephropathy: Secondary | ICD-10-CM

## 2023-06-19 DIAGNOSIS — N522 Drug-induced erectile dysfunction: Secondary | ICD-10-CM

## 2023-06-19 DIAGNOSIS — M1000 Idiopathic gout, unspecified site: Secondary | ICD-10-CM | POA: Diagnosis not present

## 2023-06-19 DIAGNOSIS — E785 Hyperlipidemia, unspecified: Secondary | ICD-10-CM

## 2023-06-19 DIAGNOSIS — I5042 Chronic combined systolic (congestive) and diastolic (congestive) heart failure: Secondary | ICD-10-CM | POA: Diagnosis not present

## 2023-06-19 DIAGNOSIS — Z0000 Encounter for general adult medical examination without abnormal findings: Secondary | ICD-10-CM | POA: Diagnosis not present

## 2023-06-19 DIAGNOSIS — N1831 Chronic kidney disease, stage 3a: Secondary | ICD-10-CM | POA: Diagnosis not present

## 2023-06-19 DIAGNOSIS — R001 Bradycardia, unspecified: Secondary | ICD-10-CM | POA: Diagnosis not present

## 2023-06-19 DIAGNOSIS — K635 Polyp of colon: Secondary | ICD-10-CM

## 2023-06-19 DIAGNOSIS — N400 Enlarged prostate without lower urinary tract symptoms: Secondary | ICD-10-CM | POA: Diagnosis not present

## 2023-06-19 DIAGNOSIS — Z23 Encounter for immunization: Secondary | ICD-10-CM | POA: Insufficient documentation

## 2023-06-19 DIAGNOSIS — I10 Essential (primary) hypertension: Secondary | ICD-10-CM

## 2023-06-19 DIAGNOSIS — Z0001 Encounter for general adult medical examination with abnormal findings: Secondary | ICD-10-CM | POA: Insufficient documentation

## 2023-06-19 DIAGNOSIS — R972 Elevated prostate specific antigen [PSA]: Secondary | ICD-10-CM | POA: Insufficient documentation

## 2023-06-19 LAB — HEPATIC FUNCTION PANEL
ALT: 11 U/L (ref 0–53)
AST: 15 U/L (ref 0–37)
Albumin: 4.5 g/dL (ref 3.5–5.2)
Alkaline Phosphatase: 79 U/L (ref 39–117)
Bilirubin, Direct: 0.2 mg/dL (ref 0.0–0.3)
Total Bilirubin: 0.9 mg/dL (ref 0.2–1.2)
Total Protein: 7.7 g/dL (ref 6.0–8.3)

## 2023-06-19 LAB — CBC WITH DIFFERENTIAL/PLATELET
Basophils Absolute: 0 10*3/uL (ref 0.0–0.1)
Basophils Relative: 0.6 % (ref 0.0–3.0)
Eosinophils Absolute: 0.2 10*3/uL (ref 0.0–0.7)
Eosinophils Relative: 6.3 % — ABNORMAL HIGH (ref 0.0–5.0)
HCT: 46.1 % (ref 39.0–52.0)
Hemoglobin: 15.1 g/dL (ref 13.0–17.0)
Lymphocytes Relative: 36 % (ref 12.0–46.0)
Lymphs Abs: 1.2 10*3/uL (ref 0.7–4.0)
MCHC: 32.8 g/dL (ref 30.0–36.0)
MCV: 97.8 fL (ref 78.0–100.0)
Monocytes Absolute: 0.4 10*3/uL (ref 0.1–1.0)
Monocytes Relative: 11 % (ref 3.0–12.0)
Neutro Abs: 1.6 10*3/uL (ref 1.4–7.7)
Neutrophils Relative %: 46.1 % (ref 43.0–77.0)
Platelets: 184 10*3/uL (ref 150.0–400.0)
RBC: 4.71 Mil/uL (ref 4.22–5.81)
RDW: 14.1 % (ref 11.5–15.5)
WBC: 3.4 10*3/uL — ABNORMAL LOW (ref 4.0–10.5)

## 2023-06-19 LAB — TSH: TSH: 1.63 u[IU]/mL (ref 0.35–5.50)

## 2023-06-19 LAB — URIC ACID: Uric Acid, Serum: 8.4 mg/dL — ABNORMAL HIGH (ref 4.0–7.8)

## 2023-06-19 LAB — LIPID PANEL
Cholesterol: 180 mg/dL (ref 0–200)
HDL: 55.2 mg/dL (ref >39.00)
LDL Cholesterol: 103 mg/dL — ABNORMAL HIGH (ref 0–99)
NonHDL: 124.6
Total CHOL/HDL Ratio: 3
Triglycerides: 108 mg/dL (ref 0.0–149.0)
VLDL: 21.6 mg/dL (ref 0.0–40.0)

## 2023-06-19 LAB — HEMOGLOBIN A1C: Hgb A1c MFr Bld: 6.8 % — ABNORMAL HIGH (ref 4.6–6.5)

## 2023-06-19 LAB — PSA: PSA: 4.59 ng/mL — ABNORMAL HIGH (ref 0.10–4.00)

## 2023-06-19 MED ORDER — ALLOPURINOL 100 MG PO TABS: 200.0000 mg | ORAL_TABLET | Freq: Every day | ORAL | 0 refills | Status: DC

## 2023-06-19 MED ORDER — DAPAGLIFLOZIN PROPANEDIOL 10 MG PO TABS: 10.0000 mg | ORAL_TABLET | Freq: Every day | ORAL | 0 refills | Status: DC

## 2023-06-19 MED ORDER — SILDENAFIL CITRATE 20 MG PO TABS: 80.0000 mg | ORAL_TABLET | Freq: Every day | ORAL | 0 refills | Status: DC | PRN

## 2023-06-19 MED ORDER — ENTRESTO 24-26 MG PO TABS: 1.0000 | ORAL_TABLET | Freq: Two times a day (BID) | ORAL | 0 refills | Status: DC

## 2023-06-19 MED ORDER — SPIRONOLACTONE 25 MG PO TABS
25.0000 mg | ORAL_TABLET | Freq: Every day | ORAL | 0 refills | Status: DC
Start: 2023-06-19 — End: 2023-07-29

## 2023-06-19 MED ORDER — SHINGRIX 50 MCG/0.5ML IM SUSR
0.5000 mL | Freq: Once | INTRAMUSCULAR | 1 refills | Status: AC
Start: 2023-06-19 — End: 2023-06-19

## 2023-06-19 MED ORDER — TORSEMIDE 20 MG PO TABS
20.0000 mg | ORAL_TABLET | Freq: Every day | ORAL | 0 refills | Status: DC
Start: 2023-06-19 — End: 2023-09-04

## 2023-06-19 MED ORDER — ZOLPIDEM TARTRATE 5 MG PO TABS: 5.0000 mg | ORAL_TABLET | Freq: Every evening | ORAL | 0 refills | Status: DC | PRN

## 2023-06-19 NOTE — Patient Instructions (Signed)
Health Maintenance, Male Adopting a healthy lifestyle and getting preventive care are important in promoting health and wellness. Ask your health care provider about: The right schedule for you to have regular tests and exams. Things you can do on your own to prevent diseases and keep yourself healthy. What should I know about diet, weight, and exercise? Eat a healthy diet  Eat a diet that includes plenty of vegetables, fruits, low-fat dairy products, and lean protein. Do not eat a lot of foods that are high in solid fats, added sugars, or sodium. Maintain a healthy weight Body mass index (BMI) is a measurement that can be used to identify possible weight problems. It estimates body fat based on height and weight. Your health care provider can help determine your BMI and help you achieve or maintain a healthy weight. Get regular exercise Get regular exercise. This is one of the most important things you can do for your health. Most adults should: Exercise for at least 150 minutes each week. The exercise should increase your heart rate and make you sweat (moderate-intensity exercise). Do strengthening exercises at least twice a week. This is in addition to the moderate-intensity exercise. Spend less time sitting. Even light physical activity can be beneficial. Watch cholesterol and blood lipids Have your blood tested for lipids and cholesterol at 73 years of age, then have this test every 5 years. You may need to have your cholesterol levels checked more often if: Your lipid or cholesterol levels are high. You are older than 73 years of age. You are at high risk for heart disease. What should I know about cancer screening? Many types of cancers can be detected early and may often be prevented. Depending on your health history and family history, you may need to have cancer screening at various ages. This may include screening for: Colorectal cancer. Prostate cancer. Skin cancer. Lung  cancer. What should I know about heart disease, diabetes, and high blood pressure? Blood pressure and heart disease High blood pressure causes heart disease and increases the risk of stroke. This is more likely to develop in people who have high blood pressure readings or are overweight. Talk with your health care provider about your target blood pressure readings. Have your blood pressure checked: Every 3-5 years if you are 18-39 years of age. Every year if you are 40 years old or older. If you are between the ages of 65 and 75 and are a current or former smoker, ask your health care provider if you should have a one-time screening for abdominal aortic aneurysm (AAA). Diabetes Have regular diabetes screenings. This checks your fasting blood sugar level. Have the screening done: Once every three years after age 45 if you are at a normal weight and have a low risk for diabetes. More often and at a younger age if you are overweight or have a high risk for diabetes. What should I know about preventing infection? Hepatitis B If you have a higher risk for hepatitis B, you should be screened for this virus. Talk with your health care provider to find out if you are at risk for hepatitis B infection. Hepatitis C Blood testing is recommended for: Everyone born from 1945 through 1965. Anyone with known risk factors for hepatitis C. Sexually transmitted infections (STIs) You should be screened each year for STIs, including gonorrhea and chlamydia, if: You are sexually active and are younger than 73 years of age. You are older than 73 years of age and your   health care provider tells you that you are at risk for this type of infection. Your sexual activity has changed since you were last screened, and you are at increased risk for chlamydia or gonorrhea. Ask your health care provider if you are at risk. Ask your health care provider about whether you are at high risk for HIV. Your health care provider  may recommend a prescription medicine to help prevent HIV infection. If you choose to take medicine to prevent HIV, you should first get tested for HIV. You should then be tested every 3 months for as long as you are taking the medicine. Follow these instructions at home: Alcohol use Do not drink alcohol if your health care provider tells you not to drink. If you drink alcohol: Limit how much you have to 0-2 drinks a day. Know how much alcohol is in your drink. In the [place] one drink equals one 12 oz bottle of beer (355 mL), one 5 oz glass of wine (148 mL), or one 1 oz glass of hard liquor (44 mL). Lifestyle Do not use any products that contain nicotine or tobacco. These products include cigarettes, chewing tobacco, and vaping devices, such as e-cigarettes. If you need help quitting, ask your health care provider. Do not use street drugs. Do not share needles. Ask your health care provider for help if you need support or information about quitting drugs. General instructions Schedule regular health, dental, and eye exams. Stay current with your vaccines. Tell your health care provider if: You often feel depressed. You have ever been abused or do not feel safe at home. Summary Adopting a healthy lifestyle and getting preventive care are important in promoting health and wellness. Follow your health care provider's instructions about healthy diet, exercising, and getting tested or screened for diseases. Follow your health care provider's instructions on monitoring your cholesterol and blood pressure. This information is not intended to replace advice given to you by your health care provider. Make sure you discuss any questions you have with your health care provider. Document Revised: 11/02/2020 Document Reviewed: 11/02/2020 Elsevier Patient Education  2024 Elsevier Inc.  

## 2023-06-19 NOTE — Progress Notes (Unsigned)
Subjective:  Patient ID: John Hogan., male    DOB: 1950/11/29  Age: 72 y.o. MRN: [id number]  CC: Hyperlipidemia, Diabetes, and Annual Exam   HPI John Hogan. presents for a CPX and f/up ---  Discussed the use of AI scribe software for clinical note transcription with the patient, who gave verbal consent to proceed.  History of Present Illness   The patient, with a history of kidney disease, had a stable kidney function at his last nephrology appointment in November. There was no discussion of dialysis at that time. He received a flu shot and a COVID vaccine in November. He denies any chest pain, shortness of breath, dizziness, lightheadedness, or symptoms of hyperglycemia or hypoglycemia.  The patient has been experiencing a low heart rate, but denies any associated symptoms such as weakness, dizziness, or lightheadedness. He is under the care of a nephrologist and a cardiologist.  The patient also reports a rash, which he has been managing with a cream. He has a family history of colon cancer. He denies any abdominal pain, swelling, or pain in the genital area. He has not noticed any blood in his stool.  The patient's last physical was over a year ago. He has not had a cholesterol level check in over a year.       Outpatient Medications Prior to Visit  Medication Sig Dispense Refill   Cholecalciferol (VITAMIN D3) 50 MCG (2000 UT) capsule TAKE 1 CAPSULE BY MOUTH EVERY DAY 90 capsule 1   pravastatin (PRAVACHOL) 20 MG tablet Take 20 mg by mouth daily.     pravastatin (PRAVACHOL) 40 MG tablet Take 1 tablet (40 mg total) by mouth daily. 90 tablet 3   SIMBRINZA 1-0.2 % SUSP Place 1 drop into both eyes in the morning and at bedtime.  3   Timolol Maleate, Once-Daily, 0.5 % SOLN Apply 1 drop to eye 2 (two) times daily.     Travoprost, BAK Free, (TRAVATAN) 0.004 % SOLN ophthalmic solution Place 1 drop into both eyes at bedtime.      allopurinol (ZYLOPRIM) 100 MG tablet TAKE TWO TABLETS  BY MOUTH DAILY (Patient taking differently: Take 100 mg by mouth daily.) 180 tablet 1   dapagliflozin propanediol (FARXIGA) 10 MG TABS tablet Take 1 tablet (10 mg total) by mouth daily before breakfast. 90 tablet 1   ENTRESTO 24-26 MG TAKE 1 TABLET BY MOUTH TWICE A DAY 180 tablet 0   sildenafil (REVATIO) 20 MG tablet Take 4 tablets (80 mg total) by mouth daily as needed. 360 tablet 0   spironolactone (ALDACTONE) 25 MG tablet TAKE ONE TABLET BY MOUTH DAILY 90 tablet 2   timolol (TIMOPTIC) 0.5 % ophthalmic solution Place 1 drop into both eyes 2 (two) times daily.  3   torsemide (DEMADEX) 20 MG tablet TAKE ONE TABLET BY MOUTH DAILY (Patient taking differently: Take 20 mg by mouth every other day.) 90 tablet 0   zolpidem (AMBIEN) 5 MG tablet TAKE ONE TABLET BY MOUTH AT BEDTIME AS NEEDED FOR SLEEP 90 tablet 0   No facility-administered medications prior to visit.    ROS Review of Systems  Constitutional: Negative.  Negative for appetite change, fatigue and fever.  HENT: Negative.    Eyes: Negative.   Respiratory: Negative.  Negative for cough, chest tightness, shortness of breath and wheezing.   Cardiovascular:  Negative for chest pain, palpitations and leg swelling.  Gastrointestinal: Negative.  Negative for abdominal pain, blood in stool, constipation, diarrhea, nausea and vomiting.  Endocrine: Negative.   Genitourinary: Negative.  Negative for difficulty urinating, dysuria, scrotal swelling and testicular pain.  Musculoskeletal: Negative.  Negative for arthralgias, back pain and myalgias.  Skin: Negative.   Neurological: Negative.  Negative for dizziness, weakness and light-headedness.  Hematological:  Negative for adenopathy. Does not bruise/bleed easily.  Psychiatric/Behavioral:  Positive for confusion, decreased concentration and sleep disturbance. Negative for hallucinations and suicidal ideas. The patient is not nervous/anxious and is not hyperactive.     Objective:  BP 120/76 (BP  Location: Right Arm, Patient Position: Sitting, Cuff Size: Normal)   Pulse (!) 49   Temp 98.3 F (36.8 C) (Oral)   Resp 16   Ht 5\' 11"  (1.803 m)   Wt 228 lb (103.4 kg)   SpO2 97%   BMI 31.80 kg/m   BP Readings from Last 3 Encounters:  06/19/23 120/76  05/07/23 111/63  02/02/23 90/60    Wt Readings from Last 3 Encounters:  06/19/23 228 lb (103.4 kg)  05/07/23 224 lb (101.6 kg)  02/02/23 228 lb 3.2 oz (103.5 kg)    Physical Exam Vitals reviewed.  Constitutional:      General: He is not in acute distress.    Appearance: Normal appearance. He is not toxic-appearing or diaphoretic.  HENT:     Nose: Nose normal.     Mouth/Throat:     Mouth: Mucous membranes are moist.  Eyes:     General: No scleral icterus.    Conjunctiva/sclera: Conjunctivae normal.  Cardiovascular:     Rate and Rhythm: Regular rhythm. Bradycardia present.     Heart sounds: Murmur heard.     No systolic murmur is present.     Diastolic murmur is present with a grade of 1/4.     No friction rub. No gallop.     Comments: EKG- SB, 49 bpm LAD Incomplete RBBB unchanged Pulmonary:     Effort: Pulmonary effort is normal.     Breath sounds: No stridor. No wheezing, rhonchi or rales.  Abdominal:     General: Abdomen is flat.     Palpations: There is no mass.     Tenderness: There is no abdominal tenderness. There is no guarding.     Hernia: No hernia is present. There is no hernia in the left inguinal area or right inguinal area.  Genitourinary:    Pubic Area: Rash present.     Penis: Normal and uncircumcised. No phimosis, paraphimosis, hypospadias, erythema, tenderness, discharge, swelling or lesions.      Testes: Normal.     Epididymis:     Right: Normal.     Left: Normal.     Prostate: Enlarged. Not tender and no nodules present.     Rectum: Normal. Guaiac result negative. No mass, tenderness, anal fissure, external hemorrhoid or internal hemorrhoid. Normal anal tone.    Musculoskeletal:         General: Normal range of motion.     Cervical back: Neck supple.     Right lower leg: No edema.     Left lower leg: No edema.  Lymphadenopathy:     Lower Body: No right inguinal adenopathy. No left inguinal adenopathy.  Skin:    General: Skin is warm and dry.     Coloration: Skin is not pale.  Neurological:     General: No focal deficit present.     Mental Status: He is alert. Mental status is at baseline.  Psychiatric:        Mood and Affect: Mood  normal.        Behavior: Behavior normal.        Thought Content: Thought content normal.        Judgment: Judgment normal.     Lab Results  Component Value Date   WBC 3.4 (L) 06/19/2023   HGB 15.1 06/19/2023   HCT 46.1 06/19/2023   PLT 184.0 06/19/2023   GLUCOSE 93 11/24/2022   CHOL 180 06/19/2023   TRIG 108.0 06/19/2023   HDL 55.20 06/19/2023   LDLDIRECT 140.7 02/27/2013   LDLCALC 103 (H) 06/19/2023   ALT 11 06/19/2023   AST 15 06/19/2023   NA 142 05/12/2023   K 4.5 05/12/2023   CL 102 05/12/2023   CREATININE 2.5 (A) 05/12/2023   BUN 31 (A) 05/12/2023   CO2 24 (A) 05/12/2023   TSH 1.63 06/19/2023   PSA 4.59 (H) 06/19/2023   HGBA1C 6.8 (H) 06/19/2023   MICROALBUR <0.7 11/24/2022    No results found.  Assessment & Plan:   Hyperlipidemia with target LDL less than 100 - LDL goal achieved. Doing well on the statin  -     Hepatic function panel; Future -     TSH; Future -     Lipid panel; Future  COMBINED HEART FAILURE, CHRONIC -     Dapagliflozin Propanediol; Take 1 tablet (10 mg total) by mouth daily before breakfast.  Dispense: 90 tablet; Refill: 0 -     Entresto; Take 1 tablet by mouth 2 (two) times daily.  Dispense: 180 tablet; Refill: 0 -     Spironolactone; Take 1 tablet (25 mg total) by mouth daily.  Dispense: 90 tablet; Refill: 0 -     Torsemide; Take 1 tablet (20 mg total) by mouth daily.  Dispense: 90 tablet; Refill: 0 -     AMB Referral VBCI Care Management  Type II diabetes mellitus with nephropathy  (HCC) - His blood sugar is well controlled. -     Dapagliflozin Propanediol; Take 1 tablet (10 mg total) by mouth daily before breakfast.  Dispense: 90 tablet; Refill: 0 -     Hemoglobin A1c; Future -     AMB Referral VBCI Care Management  Stage 3a chronic kidney disease (HCC)- Renal function is stable. -     Dapagliflozin Propanediol; Take 1 tablet (10 mg total) by mouth daily before breakfast.  Dispense: 90 tablet; Refill: 0 -     AMB Referral VBCI Care Management  Drug-induced erectile dysfunction -     Sildenafil Citrate; Take 4 tablets (80 mg total) by mouth daily as needed.  Dispense: 360 tablet; Refill: 0  Essential hypertension, benign -     Spironolactone; Take 1 tablet (25 mg total) by mouth daily.  Dispense: 90 tablet; Refill: 0 -     Torsemide; Take 1 tablet (20 mg total) by mouth daily.  Dispense: 90 tablet; Refill: 0  Psychophysiological insomnia -     Zolpidem Tartrate; Take 1 tablet (5 mg total) by mouth at bedtime as needed. for sleep  Dispense: 90 tablet; Refill: 0  Bradycardia -     EKG 12-Lead -     TSH; Future  Hypertensive heart disease without heart failure -     CBC with Differential/Platelet; Future -     TSH; Future  Benign prostatic hyperplasia without lower urinary tract symptoms -     PSA; Future  Idiopathic gout, unspecified chronicity, unspecified site -     Uric acid; Future -     Allopurinol; Take 2  tablets (200 mg total) by mouth daily.  Dispense: 180 tablet; Refill: 0 -     AMB Referral VBCI Care Management  Encounter for general adult medical examination with abnormal findings - Exam completed, labs reviewed, vaccines reviewed and updated, cancer screenings addressed, pt ed material was given.   Polyp of colon, unspecified part of colon, unspecified type -     Ambulatory referral to Gastroenterology  Need for prophylactic vaccination and inoculation against varicella -     Shingrix; Inject 0.5 mLs into the muscle once for 1 dose.  Dispense:  0.5 mL; Refill: 1  Rising PSA level -     Ambulatory referral to Urology     Follow-up: Return in about 6 months (around 12/18/2023).  Sanda Linger, MD

## 2023-06-22 ENCOUNTER — Telehealth: Payer: Self-pay

## 2023-06-22 NOTE — Progress Notes (Signed)
Care Guide Pharmacy Note  06/22/2023 Name: John Hogan. MRN: [id number] DOB: [birth year]  Referred By: Etta Grandchild, MD Reason for referral: Care Coordination (Outreach to schedule with Pharm d )   John Boesch. is a 72 y.o. year old male who is a primary care patient of Etta Grandchild, MD.  John Jock. was referred to the pharmacist for assistance related to: HLD  Successful contact was made with the patient to discuss pharmacy services including being ready for the pharmacist to call at least 5 minutes before the scheduled appointment time and to have medication bottles and any blood pressure readings ready for review. The patient agreed to meet with the pharmacist via telephone visit on (date/time).07/13/2023  Penne Lash , RMA     [place]  [facility], [facility] Guide  Direct Dial: [phone]  Website: [email or web address]

## 2023-07-03 ENCOUNTER — Other Ambulatory Visit: Payer: Self-pay | Admitting: Internal Medicine

## 2023-07-04 ENCOUNTER — Other Ambulatory Visit: Payer: Self-pay

## 2023-07-04 DIAGNOSIS — E785 Hyperlipidemia, unspecified: Secondary | ICD-10-CM

## 2023-07-04 MED ORDER — PRAVASTATIN SODIUM 80 MG PO TABS: 80.0000 mg | ORAL_TABLET | Freq: Every day | ORAL | 3 refills | Status: DC

## 2023-07-04 MED ORDER — PRAVASTATIN SODIUM 80 MG PO TABS
80.0000 mg | ORAL_TABLET | Freq: Every day | ORAL | 3 refills | Status: DC
Start: 2023-07-04 — End: 2023-07-04

## 2023-07-13 ENCOUNTER — Other Ambulatory Visit: Payer: BC Managed Care – PPO | Admitting: Pharmacist

## 2023-07-13 DIAGNOSIS — E785 Hyperlipidemia, unspecified: Secondary | ICD-10-CM

## 2023-07-13 NOTE — Patient Instructions (Signed)
It was a pleasure speaking with you today!    Feel free to call with any questions or concerns!  Arbutus Leas, PharmD, BCPS, CPP Clinical Pharmacist Practitioner [facility] at [facility] [phone]

## 2023-07-13 NOTE — Progress Notes (Signed)
07/13/2023 Name: John Hogan. MRN: [id number] DOB: 12-08-1950  Chief Complaint  Patient presents with   Medication Management    John Hogan. is a 73 y.o. year old male who presented for a telephone visit.   They were referred to the pharmacist by their PCP for assistance in managing complex medication management.    Subjective:  Care Team: Primary Care Provider: Etta Grandchild, MD ; Next Scheduled Visit: none scheduled  Medication Access/Adherence  Current Pharmacy:  [id number] [place], [place] - [address] AT [facility] [address] [place] [address] Phone: [phone] Fax: [phone]  [facility] - [place], [place] - [address] [place] [address] Phone: [phone] Fax: [phone]    Patient reports affordability concerns with their medications: No  Patient reports access/transportation concerns to their pharmacy: No  Patient reports adherence concerns with their medications:  No    Pt notes he uses a pill box and has no concerns with the meds he is currently taking. Still using commercial insurance, therefore cost is not an issue. He reports no swelling, SOB, dizziness, weakness.   Hyperlipidemia/ASCVD Risk Reduction  Current lipid lowering medications: Pravastatin 80 mg daily (recently increased by cardio) Medications tried in the past: atorvastatin Pt denies side effects with previous statin, has been on pravastatin many years  The 10-year ASCVD risk score (Arnett DK, et al., 2019) is: 30.9%   Values used to calculate the score:     Age: 43 years     Sex: Male     Is Non-Hispanic African American: Yes     Diabetic: Yes     Tobacco smoker: No     Systolic Blood Pressure: 120 mmHg     Is BP treated: Yes     HDL Cholesterol: 55.2 mg/dL     Total Cholesterol: 180 mg/dL    Objective:  Lab Results  Component Value Date   HGBA1C 6.8 (H) 06/19/2023     Lab Results  Component Value Date   CREATININE 2.5 (A) 05/12/2023   BUN 31 (A) 05/12/2023   NA 142 05/12/2023   K 4.5 05/12/2023   CL 102 05/12/2023   CO2 24 (A) 05/12/2023    Lab Results  Component Value Date   CHOL 180 06/19/2023   HDL 55.20 06/19/2023   LDLCALC 103 (H) 06/19/2023   LDLDIRECT 140.7 02/27/2013   TRIG 108.0 06/19/2023   CHOLHDL 3 06/19/2023    Medications Reviewed Today     Reviewed by Bonita Quin, RPH (Pharmacist) on 07/13/23 at 1430  Med List Status: <None>   Medication Order Taking? Sig Documenting Provider Last Dose Status Informant  allopurinol (ZYLOPRIM) 100 MG tablet [id number] Yes Take 2 tablets (200 mg total) by mouth daily. Etta Grandchild, MD Taking Active   Cholecalciferol (VITAMIN D3) 50 MCG (2000 UT) capsule [id number] Yes TAKE 1 CAPSULE BY MOUTH EVERY DAY Etta Grandchild, MD Taking Active Self  dapagliflozin propanediol (FARXIGA) 10 MG TABS tablet [id number] Yes Take 1 tablet (10 mg total) by mouth daily before breakfast. Etta Grandchild, MD Taking Active   pravastatin (PRAVACHOL) 80 MG tablet [id number] Yes Take 1 tablet (80 mg total) by mouth daily. Rollene Rotunda, MD Taking Active   sacubitril-valsartan [facility]) 24-26 [place] [id number] Yes Take 1 tablet by mouth 2 (two) times daily. Etta Grandchild, MD Taking Active   sildenafil (REVATIO) 20 MG tablet [id number] No  Take 4 tablets (80 mg total) by mouth daily as needed.  Patient not taking: Reported on 07/13/2023   Etta Grandchild, MD Not Taking Active   SIMBRINZA 1-0.2 % SUSP [id number] Yes Place 1 drop into both eyes in the morning and at bedtime. [provider] Taking Active Self  spironolactone (ALDACTONE) 25 MG tablet [id number] Yes Take 1 tablet (25 mg total) by mouth daily. Etta Grandchild, MD Taking Active   Timolol Maleate, Once-Daily, 0.5 % SOLN [id number] Yes Apply 1 drop to eye 2 (two) times daily. [provider] Taking Active   torsemide (DEMADEX) 20 MG tablet  [id number] Yes Take 1 tablet (20 mg total) by mouth daily. Etta Grandchild, MD Taking Active   Travoprost, BAK Free, (TRAVATAN) 0.004 % SOLN ophthalmic solution [id number] Yes Place 1 drop into both eyes at bedtime.  [provider] Taking Active Self  zolpidem (AMBIEN) 5 MG tablet [id number] Yes Take 1 tablet (5 mg total) by mouth at bedtime as needed. for sleep Etta Grandchild, MD Taking Active               Assessment/Plan:   Hyperlipidemia/ASCVD Risk Reduction: - Currently uncontrolled. LDL goal <70 due to concomitant diabetes - Recommend to continue pravastatin 80 mg. He has upcoming appt to get lipid panel in April. Will f/u results and see if change is needed. - Consider change to high intensity statin given high 10 year ASCVD risk and LDL goal <70.   Follow Up Plan: F/u April lipid panel  Arbutus Leas, PharmD, BCPS, CPP Clinical Pharmacist Practitioner [facility] at [facility] [phone]

## 2023-07-29 ENCOUNTER — Other Ambulatory Visit: Payer: Self-pay | Admitting: Internal Medicine

## 2023-07-29 DIAGNOSIS — I10 Essential (primary) hypertension: Secondary | ICD-10-CM

## 2023-07-29 DIAGNOSIS — I5042 Chronic combined systolic (congestive) and diastolic (congestive) heart failure: Secondary | ICD-10-CM

## 2023-08-04 ENCOUNTER — Encounter: Payer: Self-pay | Admitting: Internal Medicine

## 2023-08-09 ENCOUNTER — Ambulatory Visit (INDEPENDENT_AMBULATORY_CARE_PROVIDER_SITE_OTHER): Payer: BC Managed Care – PPO | Admitting: Podiatry

## 2023-08-09 ENCOUNTER — Encounter: Payer: Self-pay | Admitting: Podiatry

## 2023-08-09 VITALS — Ht 71.0 in | Wt 228.0 lb

## 2023-08-09 DIAGNOSIS — B351 Tinea unguium: Secondary | ICD-10-CM | POA: Diagnosis not present

## 2023-08-09 DIAGNOSIS — M79675 Pain in left toe(s): Secondary | ICD-10-CM | POA: Diagnosis not present

## 2023-08-09 DIAGNOSIS — M79674 Pain in right toe(s): Secondary | ICD-10-CM

## 2023-08-09 NOTE — Progress Notes (Signed)
This patient returns to my office for at risk foot care.  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  This patient is unable to cut nails himself since the patient cannot reach his nails.These nails are painful walking and wearing shoes.  This patient presents for at risk foot care today.  General Appearance  Alert, conversant and in no acute stress.  Vascular  Dorsalis pedis and posterior tibial  pulses are palpable  bilaterally.  Capillary return is within normal limits  bilaterally. Cold feet.  bilaterally.  Neurologic  Senn-Weinstein monofilament wire test within normal limits  bilaterally. Muscle power within normal limits bilaterally.  Nails Thick disfigured discolored nails with subungual debris  from hallux to fifth toes bilaterally. No evidence of bacterial infection or drainage bilaterally.  Orthopedic  No limitations of motion  feet .  No crepitus or effusions noted.  No bony pathology or digital deformities noted.  Skin  normotropic skin with no porokeratosis noted bilaterally.  No signs of infections or ulcers noted.     Onychomycosis  Pain in right toes  Pain in left toes  Consent was obtained for treatment procedures.   Mechanical debridement of nails 1-5  bilaterally performed with a nail nipper.  Filed with dremel without incident.    Return office visit    3 months                  Told patient to return for periodic foot care and evaluation due to potential at risk complications.   Helane Gunther DPM

## 2023-09-02 ENCOUNTER — Other Ambulatory Visit: Payer: Self-pay | Admitting: Internal Medicine

## 2023-09-02 DIAGNOSIS — N1831 Chronic kidney disease, stage 3a: Secondary | ICD-10-CM

## 2023-09-02 DIAGNOSIS — I5042 Chronic combined systolic (congestive) and diastolic (congestive) heart failure: Secondary | ICD-10-CM

## 2023-09-02 DIAGNOSIS — E1121 Type 2 diabetes mellitus with diabetic nephropathy: Secondary | ICD-10-CM

## 2023-09-02 DIAGNOSIS — I10 Essential (primary) hypertension: Secondary | ICD-10-CM

## 2023-09-11 ENCOUNTER — Ambulatory Visit (AMBULATORY_SURGERY_CENTER): Payer: BC Managed Care – PPO

## 2023-09-11 ENCOUNTER — Other Ambulatory Visit: Payer: Self-pay

## 2023-09-11 VITALS — Ht 71.0 in | Wt 223.0 lb

## 2023-09-11 DIAGNOSIS — Z860100 Personal history of colon polyps, unspecified: Secondary | ICD-10-CM

## 2023-09-11 DIAGNOSIS — Z800 Family history of malignant neoplasm of digestive organs: Secondary | ICD-10-CM

## 2023-09-11 MED ORDER — NA SULFATE-K SULFATE-MG SULF 17.5-3.13-1.6 GM/177ML PO SOLN
1.0000 | Freq: Once | ORAL | 0 refills | Status: AC
Start: 2023-09-11 — End: 2023-09-11

## 2023-09-11 NOTE — Progress Notes (Signed)
 Denies allergies to eggs or soy products. Denies complication of anesthesia or sedation. Denies use of weight loss medication. Denies use of O2.   Emmi instructions given for colonoscopy.

## 2023-09-12 ENCOUNTER — Telehealth: Payer: Self-pay | Admitting: Internal Medicine

## 2023-09-12 NOTE — Telephone Encounter (Signed)
 Copied from CRM [id number]. Topic: Clinical - Prescription Issue >> Sep 12, 2023  9:45 AM Drema Balzarine wrote: Reason for CRM: Patient called regarding the John Hogan and FARXIGA, its over $1000 and needs help getting cost lower. Please call.

## 2023-09-12 NOTE — Telephone Encounter (Signed)
**Note De-identified  [place] Obfuscation** Please advise 

## 2023-09-12 NOTE — Telephone Encounter (Signed)
 Pharmacy had already applied copay card to make Farxiga $0. Enrolled patient in [place] copay card and brought cost down to $10/90DS. Attempted to notify patient, had to leave a voicemail.  Copay card for entresto listed below for reference if needed in future: BIN: [id number] PCN: [id number] Group: [id number] ID: [id number]

## 2023-09-21 ENCOUNTER — Encounter: Payer: Self-pay | Admitting: Internal Medicine

## 2023-09-25 ENCOUNTER — Encounter: Payer: Self-pay | Admitting: Internal Medicine

## 2023-09-25 ENCOUNTER — Ambulatory Visit (AMBULATORY_SURGERY_CENTER): Payer: BC Managed Care – PPO | Admitting: Internal Medicine

## 2023-09-25 VITALS — BP 114/72 | HR 39 | Temp 97.3°F | Resp 11 | Ht 71.0 in | Wt 223.0 lb

## 2023-09-25 DIAGNOSIS — D124 Benign neoplasm of descending colon: Secondary | ICD-10-CM

## 2023-09-25 DIAGNOSIS — Z1211 Encounter for screening for malignant neoplasm of colon: Secondary | ICD-10-CM | POA: Diagnosis present

## 2023-09-25 DIAGNOSIS — Z860100 Personal history of colon polyps, unspecified: Secondary | ICD-10-CM

## 2023-09-25 DIAGNOSIS — K648 Other hemorrhoids: Secondary | ICD-10-CM | POA: Diagnosis not present

## 2023-09-25 DIAGNOSIS — K635 Polyp of colon: Secondary | ICD-10-CM | POA: Diagnosis not present

## 2023-09-25 DIAGNOSIS — D123 Benign neoplasm of transverse colon: Secondary | ICD-10-CM | POA: Diagnosis not present

## 2023-09-25 DIAGNOSIS — K5730 Diverticulosis of large intestine without perforation or abscess without bleeding: Secondary | ICD-10-CM

## 2023-09-25 DIAGNOSIS — D120 Benign neoplasm of cecum: Secondary | ICD-10-CM

## 2023-09-25 DIAGNOSIS — Z800 Family history of malignant neoplasm of digestive organs: Secondary | ICD-10-CM

## 2023-09-25 MED ORDER — SODIUM CHLORIDE 0.9 % IV SOLN: 500.0000 mL | Freq: Once | INTRAVENOUS | Status: DC

## 2023-09-25 NOTE — Progress Notes (Signed)
 Called to room to assist during endoscopic procedure.  Patient ID and intended procedure confirmed with present staff. Received instructions for my participation in the procedure from the performing physician.

## 2023-09-25 NOTE — Patient Instructions (Signed)

## 2023-09-25 NOTE — Progress Notes (Signed)
 Pt resting comfortably. VSS. Airway intact. SBAR complete to RN. All questions answered.

## 2023-09-25 NOTE — Op Note (Signed)
 [facility] Patient Name: John Hogan Procedure Date: 09/25/2023 8:21 AM MRN: [id number] Endoscopist: Beverley Fiedler , MD, [id number] Age: 73 Referring MD:  Date of Birth: 04-24-1951 Gender: Male Account #: [id number] Procedure:                Colonoscopy Indications:              High risk colon cancer surveillance: Personal                            history of multiple adenomas, Family history of                            colon cancer (father), Last colonoscopy: May 2020                            (TA x 1), 2018 (TA > 10 polyps) Medicines:                Monitored Anesthesia Care Procedure:                Pre-Anesthesia Assessment:                           - Prior to the procedure, a History and Physical                            was performed, and patient medications and                            allergies were reviewed. The patient's tolerance of                            previous anesthesia was also reviewed. The risks                            and benefits of the procedure and the sedation                            options and risks were discussed with the patient.                            All questions were answered, and informed consent                            was obtained. Prior Anticoagulants: The patient has                            taken no anticoagulant or antiplatelet agents. ASA                            Grade Assessment: III - A patient with severe                            systemic disease. After reviewing the risks and  benefits, the patient was deemed in satisfactory                            condition to undergo the procedure.                           After obtaining informed consent, the colonoscope                            was passed under direct vision. Throughout the                            procedure, the patient's blood pressure, pulse, and                            oxygen saturations were monitored  continuously. The                            Olympus Scope [id number] was introduced through the                            anus and advanced to the cecum, identified by                            appendiceal orifice and ileocecal valve. The                            colonoscopy was performed without difficulty. The                            patient tolerated the procedure well. The quality                            of the bowel preparation was good. The ileocecal                            valve, appendiceal orifice, and rectum were                            photographed. Scope In: 8:38:45 AM Scope Out: 8:52:57 AM Scope Withdrawal Time: 0 hours 11 minutes 44 seconds  Total Procedure Duration: 0 hours 14 minutes 12 seconds  Findings:                 The digital rectal exam was normal.                           A 4 mm polyp was found in the cecum. The polyp was                            sessile. The polyp was removed with a cold snare.                            Resection and retrieval were complete.  Two sessile polyps were found in the transverse                            colon. The polyps were 3 to 5 mm in size. These                            polyps were removed with a cold snare. Resection                            and retrieval were complete.                           Two sessile polyps were found in the descending                            colon. The polyps were 4 to 5 mm in size. These                            polyps were removed with a cold snare. Resection                            and retrieval were complete.                           Scattered medium-mouthed and small-mouthed                            diverticula were found from cecum to sigmoid colon.                           Internal hemorrhoids were found during                            retroflexion. The hemorrhoids were small. Complications:            No immediate  complications. Estimated Blood Loss:     Estimated blood loss: none. Impression:               - One 4 mm polyp in the cecum, removed with a cold                            snare. Resected and retrieved.                           - Two 3 to 5 mm polyps in the transverse colon,                            removed with a cold snare. Resected and retrieved.                           - Two 4 to 5 mm polyps in the descending colon,                            removed  with a cold snare. Resected and retrieved.                           - Mild diverticulosis from cecum to sigmoid colon.                           - Small internal hemorrhoids. Recommendation:           - Patient has a contact number available for                            emergencies. The signs and symptoms of potential                            delayed complications were discussed with the                            patient. Return to normal activities tomorrow.                            Written discharge instructions were provided to the                            patient.                           - Resume previous diet.                           - Continue present medications.                           - Await pathology results.                           - Repeat colonoscopy is recommended for                            surveillance. The colonoscopy date will be                            determined after pathology results from today's                            exam become available for review. Beverley Fiedler, MD 09/25/2023 8:57:21 AM This report has been signed electronically.

## 2023-09-25 NOTE — Progress Notes (Signed)
VS by SS  Pt's states no medical or surgical changes since previsit or office visit.  

## 2023-09-25 NOTE — Progress Notes (Signed)
 GASTROENTEROLOGY PROCEDURE H&P NOTE   Primary Care Physician: Etta Grandchild, MD    Reason for Procedure:  History of multiple adenomatous colon polyps  Plan:    Colonoscopy  Patient is appropriate for endoscopic procedure(s) in the ambulatory ([facility]) setting.  The nature of the procedure, as well as the risks, benefits, and alternatives were carefully and thoroughly reviewed with the patient. Ample time for discussion and questions allowed. The patient understood, was satisfied, and agreed to proceed.     HPI: John Hogan. is a 73 y.o. male who presents for surveillance colonoscopy.  Medical history as below.  Tolerated the prep.  No recent chest pain or shortness of breath.  No abdominal pain today.  Past Medical History:  Diagnosis Date   AI (aortic insufficiency)    Arthritis    Cataract    CHF (congestive heart failure) (HCC)    Chronic kidney disease    Diabetes mellitus without complication (HCC)    Glaucoma    Gout    H/O exercise stress test    Myoview 01/03/12: No scar or ischemia, EF 53%   Hyperlipidemia    Hypertension    Hypertensive cardiomyopathy (HCC)    EF previously 35%;  echo 12/2012 EF 55%, mild AI, ascending aorta mildly dilated, mild LAE    Past Surgical History:  Procedure Laterality Date   COLONOSCOPY     CYST REMOVAL HAND     HERNIA REPAIR     Umbilical   MASS EXCISION Left 08/04/2020   Procedure: EXCISION MASS LEFT PALM;  Surgeon: Cindee Salt, MD;  Location: [facility];  Service: Orthopedics;  Laterality: Left;   POLYPECTOMY     TOTAL HIP ARTHROPLASTY Right 08/27/2021   Procedure: Right HIP ARTHROPLASTY ANTERIOR APPROACH;  Surgeon: Kathryne Hitch, MD;  Location: [facility];  Service: Orthopedics;  Laterality: Right;   WISDOM TOOTH EXTRACTION     WRIST SURGERY     right    Prior to Admission medications   Medication Sig Start Date End Date Taking? Authorizing Provider  allopurinol (ZYLOPRIM) 100 MG tablet Take 2  tablets (200 mg total) by mouth daily. 06/19/23  Yes Etta Grandchild, MD  Cholecalciferol (VITAMIN D3) 50 MCG (2000 UT) capsule TAKE 1 CAPSULE BY MOUTH EVERY DAY 01/03/21  Yes Etta Grandchild, MD  ENTRESTO 24-26 MG TAKE 1 TABLET BY MOUTH TWICE A DAY 09/04/23  Yes Etta Grandchild, MD  FARXIGA 10 MG TABS tablet TAKE 1 TABLET BY MOUTH DAILY BEFORE BREAKFAST. 09/04/23  Yes Etta Grandchild, MD  pravastatin (PRAVACHOL) 80 MG tablet Take 1 tablet (80 mg total) by mouth daily. 07/04/23  Yes Rollene Rotunda, MD  SIMBRINZA 1-0.2 % SUSP Place 1 drop into both eyes in the morning and at bedtime. 01/08/18  Yes [provider]  spironolactone (ALDACTONE) 25 MG tablet TAKE 1 TABLET (25 MG TOTAL) BY MOUTH DAILY. 07/29/23  Yes Etta Grandchild, MD  Timolol Maleate, Once-Daily, 0.5 % SOLN Apply 1 drop to eye 2 (two) times daily. 03/01/23  Yes [provider]  torsemide (DEMADEX) 20 MG tablet TAKE 1 TABLET BY MOUTH EVERY DAY 09/04/23  Yes Etta Grandchild, MD  Travoprost, BAK Free, (TRAVATAN) 0.004 % SOLN ophthalmic solution Place 1 drop into both eyes at bedtime.    Yes [provider]  zolpidem (AMBIEN) 5 MG tablet Take 1 tablet (5 mg total) by mouth at bedtime as needed. for sleep 06/19/23  Yes Etta Grandchild,  MD    Current Outpatient Medications  Medication Sig Dispense Refill   allopurinol (ZYLOPRIM) 100 MG tablet Take 2 tablets (200 mg total) by mouth daily. 180 tablet 0   Cholecalciferol (VITAMIN D3) 50 MCG (2000 UT) capsule TAKE 1 CAPSULE BY MOUTH EVERY DAY 90 capsule 1   ENTRESTO 24-26 MG TAKE 1 TABLET BY MOUTH TWICE A DAY 180 tablet 0   FARXIGA 10 MG TABS tablet TAKE 1 TABLET BY MOUTH DAILY BEFORE BREAKFAST. 90 tablet 0   pravastatin (PRAVACHOL) 80 MG tablet Take 1 tablet (80 mg total) by mouth daily. 90 tablet 3   SIMBRINZA 1-0.2 % SUSP Place 1 drop into both eyes in the morning and at bedtime.  3   spironolactone (ALDACTONE) 25 MG tablet TAKE 1 TABLET (25 MG TOTAL) BY MOUTH DAILY. 90  tablet 0   Timolol Maleate, Once-Daily, 0.5 % SOLN Apply 1 drop to eye 2 (two) times daily.     torsemide (DEMADEX) 20 MG tablet TAKE 1 TABLET BY MOUTH EVERY DAY 90 tablet 0   Travoprost, BAK Free, (TRAVATAN) 0.004 % SOLN ophthalmic solution Place 1 drop into both eyes at bedtime.      zolpidem (AMBIEN) 5 MG tablet Take 1 tablet (5 mg total) by mouth at bedtime as needed. for sleep 90 tablet 0   Current Facility-Administered Medications  Medication Dose Route Frequency Provider Last Rate Last Admin   0.9 %  sodium chloride infusion  500 mL Intravenous Once Lynann Bologna, MD        Allergies as of 09/25/2023   (No Known Allergies)    Family History  Problem Relation Age of Onset   Hypertension Father    Colon cancer Father 31   Hyperlipidemia Father    Heart disease Father    Diabetes Father    Stroke Mother        deceased   Colon cancer Mother 82   Arthritis Mother    Hyperlipidemia Mother    Hypertension Mother    Diabetes Mother    Colon polyps Sister    Colon polyps Brother    COPD Neg Hx    Alcohol abuse Neg Hx    Kidney disease Neg Hx    Esophageal cancer Neg Hx    Rectal cancer Neg Hx    Stomach cancer Neg Hx     Social History   Socioeconomic History   Marital status: Married    Spouse name: Not on file   Number of children: 2   Years of education: Not on file   Highest education level: 12th grade  Occupational History    Employer: [employer]  Tobacco Use   Smoking status: Former    Current packs/day: 0.00    Average packs/day: 1 pack/day for 2.0 years (2.0 ttl pk-yrs)    Types: Cigarettes    Start date: 01/09/1973    Quit date: 01/10/1975    Years since quitting: 48.7   Smokeless tobacco: Never   Tobacco comments:    Only smoked two years  Vaping Use   Vaping status: Never Used  Substance and Sexual Activity   Alcohol use: No   Drug use: No   Sexual activity: Yes  Other Topics Concern   Not on file  Social History Narrative   Lives  at home with wife.   Social Drivers of Health   Financial Resource Strain: Low Risk  (11/23/2022)   Overall Financial Resource Strain (CARDIA)    Difficulty of Paying Living  Expenses: Not hard at all  Food Insecurity: No Food Insecurity (11/23/2022)   Hunger Vital Sign    Worried About Running Out of Food in the Last Year: Never true    Ran Out of Food in the Last Year: Never true  Transportation Needs: Unknown (11/23/2022)   PRAPARE - Administrator, Civil Service (Medical): No    Lack of Transportation (Non-Medical): Not on file  Physical Activity: Unknown (11/23/2022)   Exercise Vital Sign    Days of Exercise per Week: 0 days    Minutes of Exercise per Session: Not on file  Stress: No Stress Concern Present (11/23/2022)   [employer] of Occupational Health - Occupational Stress Questionnaire    Feeling of Stress : Not at all  Social Connections: Moderately Isolated (11/23/2022)   Social Connection and Isolation Panel [NHANES]    Frequency of Communication with Friends and Family: Once a week    Frequency of Social Gatherings with Friends and Family: Once a week    Attends Religious Services: More than 4 times per year    Active Member of [employer] or Organizations: No    Attends Engineer, structural: Not on file    Marital Status: Married  Catering manager Violence: Not on file    Physical Exam: Vital signs in last 24 hours: @BP  121/64   Pulse (!) 45   Temp (!) 97.3 F (36.3 C)   Ht 5\' 11"  (1.803 m)   Wt 223 lb (101.2 kg)   SpO2 97%   BMI 31.10 kg/m  GEN: NAD EYE: Sclerae anicteric ENT: MMM CV: Non-tachycardic Pulm: CTA b/l GI: Soft, NT/ND NEURO:  Alert & Oriented x 3   Erick Blinks, MD [facility]  09/25/2023 8:25 AM

## 2023-09-26 ENCOUNTER — Telehealth: Payer: Self-pay | Admitting: *Deleted

## 2023-09-26 NOTE — Telephone Encounter (Signed)
  Follow up Call-     09/25/2023    8:18 AM  Call back number  Post procedure Call Back phone  # [phone]  Permission to leave phone message Yes     Patient questions:  Do you have a fever, pain , or abdominal swelling? No. Pain Score  0 *  Have you tolerated food without any problems? Yes.    Have you been able to return to your normal activities? Yes.    Do you have any questions about your discharge instructions: Diet   No. Medications  No. Follow up visit  No.  Do you have questions or concerns about your Care? No.  Actions: * If pain score is 4 or above: No action needed, pain <4.

## 2023-09-27 LAB — SURGICAL PATHOLOGY

## 2023-09-28 ENCOUNTER — Encounter: Payer: Self-pay | Admitting: Internal Medicine

## 2023-09-28 ENCOUNTER — Other Ambulatory Visit: Payer: Self-pay | Admitting: Internal Medicine

## 2023-09-28 DIAGNOSIS — M1000 Idiopathic gout, unspecified site: Secondary | ICD-10-CM

## 2023-10-05 ENCOUNTER — Other Ambulatory Visit: Payer: Self-pay | Admitting: Internal Medicine

## 2023-10-05 DIAGNOSIS — F5104 Psychophysiologic insomnia: Secondary | ICD-10-CM

## 2023-10-07 ENCOUNTER — Other Ambulatory Visit: Payer: Self-pay | Admitting: Internal Medicine

## 2023-10-07 DIAGNOSIS — M1000 Idiopathic gout, unspecified site: Secondary | ICD-10-CM

## 2023-11-05 ENCOUNTER — Ambulatory Visit: Admission: EM | Admit: 2023-11-05 | Discharge: 2023-11-05 | Disposition: A | Discharge status: Home / Self Care

## 2023-11-05 DIAGNOSIS — R21 Rash and other nonspecific skin eruption: Secondary | ICD-10-CM

## 2023-11-05 MED ORDER — TRIAMCINOLONE ACETONIDE 0.1 % EX CREA: 1.0000 | TOPICAL_CREAM | Freq: Two times a day (BID) | CUTANEOUS | 0 refills | Status: AC

## 2023-11-05 MED ORDER — DOXYCYCLINE HYCLATE 100 MG PO CAPS: 100.0000 mg | ORAL_CAPSULE | Freq: Two times a day (BID) | ORAL | 0 refills | Status: AC

## 2023-11-05 NOTE — ED Triage Notes (Signed)
"  I have a rash on my lower right leg (ankle and above)". "This is new, I haven't been seen or treated for it". No fever.

## 2023-11-05 NOTE — ED Provider Notes (Signed)
 [facility]    CSN: [id number] Arrival date & time: 11/05/23  1107      History   Chief Complaint Chief Complaint  Patient presents with   Rash    HPI John Hogan. is a 73 y.o. male.   Patient here today for evaluation of rash to his right lower leg that started over the last several days.  He reports that he has not had evaluation of this in the past.  He denies any fever.  He is unsure of any insect bites or causes of rash.  He does note some pain associated with rash.  The history is provided by the patient.  Rash Associated symptoms: no fever and no shortness of breath     Past Medical History:  Diagnosis Date   AI (aortic insufficiency)    Arthritis    Cataract    CHF (congestive heart failure) (HCC)    Chronic kidney disease    Diabetes mellitus without complication (HCC)    Glaucoma    Gout    H/O exercise stress test    Myoview  01/03/12: No scar or ischemia, EF 53%   Hyperlipidemia    Hypertension    Hypertensive cardiomyopathy (HCC)    EF previously 35%;  echo 12/2012 EF 55%, mild AI, ascending aorta mildly dilated, mild LAE    Patient Active Problem List   Diagnosis Date Noted   Pain due to onychomycosis of toenails of both feet 08/09/2023   Encounter for general adult medical examination with abnormal findings 06/19/2023   Need for prophylactic vaccination and inoculation against varicella 06/19/2023   Rising PSA level 06/19/2023   Type II diabetes mellitus with nephropathy (HCC) 03/08/2022   Benign prostatic hyperplasia without lower urinary tract symptoms 03/08/2022   Need for vaccination 03/08/2022   Unilateral primary osteoarthritis, right hip 08/26/2021   / 07/23/2021   Chronic pain of right hip 02/22/2021   Grade I diastolic dysfunction 10/28/2019   Bradycardia 10/06/2019   Vitamin D  deficiency 09/06/2017   CKD (chronic kidney disease) stage 3, GFR 30-59 ml/min (HCC) 08/07/2014   Insomnia 02/14/2014   Erectile dysfunction  02/27/2013   Papular atopic dermatitis 05/16/2012   Gout 02/16/2012   Hyperlipidemia with target LDL less than 100 02/16/2012   Aortic regurgitation 02/16/2012   Colon polyps 02/16/2012   Essential hypertension, benign 03/07/2009   COMBINED HEART FAILURE, CHRONIC 03/07/2009    Past Surgical History:  Procedure Laterality Date   COLONOSCOPY     CYST REMOVAL HAND     HERNIA REPAIR     Umbilical   MASS EXCISION Left 08/04/2020   Procedure: EXCISION MASS LEFT PALM;  Surgeon: Lyanne Sample, MD;  Location: [facility];  Service: Orthopedics;  Laterality: Left;   POLYPECTOMY     TOTAL HIP ARTHROPLASTY Right 08/27/2021   Procedure: Right HIP ARTHROPLASTY ANTERIOR APPROACH;  Surgeon: Arnie Lao, MD;  Location: [facility];  Service: Orthopedics;  Laterality: Right;   WISDOM TOOTH EXTRACTION     WRIST SURGERY     right       Home Medications    Prior to Admission medications   Medication Sig Start Date End Date Taking? Authorizing Provider  Na Sulfate-K Sulfate-Mg Sulfate concentrate (SUPREP) 17.5-3.13-1.6 GM/177ML SOLN Take 1 kit by mouth as directed. 09/11/23  Yes [provider]  triamcinolone  cream (KENALOG ) 0.1 % Apply 1 Application topically 2 (two) times daily. 11/05/23  Yes Vernestine Gondola, PA-C  allopurinol  (ZYLOPRIM ) 100 MG  tablet TAKE 2 TABLETS BY MOUTH EVERY DAY 11/06/23   Arcadio Knuckles, MD  Cholecalciferol  (VITAMIN D3) 50 MCG (2000 UT) capsule TAKE 1 CAPSULE BY MOUTH EVERY DAY 01/03/21   Arcadio Knuckles, MD  ENTRESTO  24-26 MG TAKE 1 TABLET BY MOUTH TWICE A DAY 09/04/23   Arcadio Knuckles, MD  FARXIGA  10 MG TABS tablet TAKE 1 TABLET BY MOUTH DAILY BEFORE BREAKFAST. 09/04/23   Arcadio Knuckles, MD  pravastatin  (PRAVACHOL ) 80 MG tablet Take 1 tablet (80 mg total) by mouth daily. 07/04/23   Eilleen Grates, MD  SIMBRINZA 1-0.2 % SUSP Place 1 drop into both eyes in the morning and at bedtime. 01/08/18   [provider]  spironolactone  (ALDACTONE ) 25  MG tablet TAKE 1 TABLET (25 MG TOTAL) BY MOUTH DAILY. 07/29/23   Arcadio Knuckles, MD  Timolol  Maleate, Once-Daily, 0.5 % SOLN Apply 1 drop to eye 2 (two) times daily. 03/01/23   [provider]  torsemide  (DEMADEX ) 20 MG tablet TAKE 1 TABLET BY MOUTH EVERY DAY 09/04/23   Arcadio Knuckles, MD  Travoprost, BAK Free, (TRAVATAN) 0.004 % SOLN ophthalmic solution Place 1 drop into both eyes at bedtime.     [provider]  zolpidem  (AMBIEN ) 5 MG tablet TAKE 1 TABLET (5 MG TOTAL) BY MOUTH EVERY DAY AT BEDTIME AS NEEDED FOR SLEEP 10/06/23   Arcadio Knuckles, MD    Family History Family History  Problem Relation Age of Onset   Hypertension Father    Colon cancer Father 21   Hyperlipidemia Father    Heart disease Father    Diabetes Father    Stroke Mother        deceased   Colon cancer Mother 39   Arthritis Mother    Hyperlipidemia Mother    Hypertension Mother    Diabetes Mother    Colon polyps Sister    Colon polyps Brother    COPD Neg Hx    Alcohol abuse Neg Hx    Kidney disease Neg Hx    Esophageal cancer Neg Hx    Rectal cancer Neg Hx    Stomach cancer Neg Hx     Social History Social History   Tobacco Use   Smoking status: Former    Current packs/day: 0.00    Average packs/day: 1 pack/day for 2.0 years (2.0 ttl pk-yrs)    Types: Cigarettes    Start date: 01/09/1973    Quit date: 01/10/1975    Years since quitting: 48.8   Smokeless tobacco: Never   Tobacco comments:    Only smoked two years  Vaping Use   Vaping status: Never Used  Substance Use Topics   Alcohol use: No   Drug use: No     Allergies   Advil [ibuprofen]   Review of Systems Review of Systems  Constitutional:  Negative for chills and fever.  Eyes:  Negative for discharge and redness.  Respiratory:  Negative for shortness of breath.   Skin:  Positive for color change and rash.  Neurological:  Negative for numbness.     Physical Exam Triage Vital Signs ED Triage Vitals  Encounter  Vitals Group     BP 11/05/23 1146 101/64     Systolic BP Percentile --      Diastolic BP Percentile --      Pulse Rate 11/05/23 1146 (!) 54     Resp 11/05/23 1146 18     Temp 11/05/23 1146 98.3 F (36.8 C)  Temp Source 11/05/23 1146 Oral     SpO2 11/05/23 1146 95 %     Weight 11/05/23 1144 228 lb (103.4 kg)     Height 11/05/23 1144 5\' 11"  (1.803 m)     Head Circumference --      Peak Flow --      Pain Score 11/05/23 1142 0     Pain Loc --      Pain Education --      Exclude from Growth Chart --    No data found.  Updated Vital Signs BP 101/64 (BP Location: Left Arm)   Pulse (!) 58   Temp 98.3 F (36.8 C) (Oral)   Resp 18   Ht 5\' 11"  (1.803 m)   Wt 228 lb (103.4 kg)   SpO2 95%   BMI 31.80 kg/m   Visual Acuity Right Eye Distance:   Left Eye Distance:   Bilateral Distance:    Right Eye Near:   Left Eye Near:    Bilateral Near:     Physical Exam Vitals and nursing note reviewed.  Constitutional:      General: He is not in acute distress.    Appearance: Normal appearance. He is not ill-appearing.  HENT:     Head: Normocephalic and atraumatic.  Eyes:     Conjunctiva/sclera: Conjunctivae normal.  Cardiovascular:     Rate and Rhythm: Normal rate.  Pulmonary:     Effort: Pulmonary effort is normal.  Skin:    Comments: Area of maculopapular rash to right lower leg above ankle with associated erythema  Neurological:     Mental Status: He is alert.  Psychiatric:        Mood and Affect: Mood normal.        Behavior: Behavior normal.        Thought Content: Thought content normal.      UC Treatments / Results  Labs (all labs ordered are listed, but only abnormal results are displayed) Labs Reviewed - No data to display  EKG   Radiology No results found.  Procedures Procedures (including critical care time)  Medications Ordered in UC Medications - No data to display  Initial Impression / Assessment and Plan / UC Course  I have reviewed the  triage vital signs and the nursing notes.  Pertinent labs & imaging results that were available during my care of the patient were reviewed by me and considered in my medical decision making (see chart for details).    Will treat with steroid topically as well as doxycycline  to cover possible secondary infection.  Encouraged follow-up if no gradual improvement or with any further concerns.  Final Clinical Impressions(s) / UC Diagnoses   Final diagnoses:  Rash and nonspecific skin eruption   Discharge Instructions   None    ED Prescriptions     Medication Sig Dispense Auth. Provider   doxycycline  (VIBRAMYCIN ) 100 MG capsule Take 1 capsule (100 mg total) by mouth 2 (two) times daily for 7 days. 14 capsule Kemaya Dorner F, PA-C   triamcinolone  cream (KENALOG ) 0.1 % Apply 1 Application topically 2 (two) times daily. 30 g Vernestine Gondola, PA-C      PDMP not reviewed this encounter.   Vernestine Gondola, PA-C 11/13/23 [phone]

## 2023-11-08 ENCOUNTER — Ambulatory Visit (INDEPENDENT_AMBULATORY_CARE_PROVIDER_SITE_OTHER): Payer: BC Managed Care – PPO | Admitting: Podiatry

## 2023-11-08 ENCOUNTER — Encounter: Payer: Self-pay | Admitting: Podiatry

## 2023-11-08 DIAGNOSIS — M79675 Pain in left toe(s): Secondary | ICD-10-CM | POA: Diagnosis not present

## 2023-11-08 DIAGNOSIS — B351 Tinea unguium: Secondary | ICD-10-CM | POA: Diagnosis not present

## 2023-11-08 DIAGNOSIS — E1121 Type 2 diabetes mellitus with diabetic nephropathy: Secondary | ICD-10-CM

## 2023-11-08 DIAGNOSIS — M79674 Pain in right toe(s): Secondary | ICD-10-CM

## 2023-11-08 NOTE — Progress Notes (Signed)
 This patient returns to my office for at risk foot care.  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  This patient is unable to cut nails himself since the patient cannot reach his nails.These nails are painful walking and wearing shoes.  This patient presents for at risk foot care today.  General Appearance  Alert, conversant and in no acute stress.  Vascular  Dorsalis pedis and posterior tibial  pulses are palpable  bilaterally.  Capillary return is within normal limits  bilaterally. Cold feet.  bilaterally.  Neurologic  Senn-Weinstein monofilament wire test within normal limits  bilaterally. Muscle power within normal limits bilaterally.  Nails Thick disfigured discolored nails with subungual debris  from hallux to fifth toes bilaterally. No evidence of bacterial infection or drainage bilaterally.  Orthopedic  No limitations of motion  feet .  No crepitus or effusions noted.  No bony pathology or digital deformities noted.  Skin  normotropic skin with no porokeratosis noted bilaterally.  No signs of infections or ulcers noted.     Onychomycosis  Pain in right toes  Pain in left toes  Consent was obtained for treatment procedures.   Mechanical debridement of nails 1-5  bilaterally performed with a nail nipper.  Filed with dremel without incident.    Return office visit    3 months                  Told patient to return for periodic foot care and evaluation due to potential at risk complications.   Helane Gunther DPM

## 2023-11-13 ENCOUNTER — Encounter: Payer: Self-pay | Admitting: Physician Assistant

## 2023-11-22 ENCOUNTER — Encounter: Payer: Self-pay | Admitting: Pharmacist

## 2023-12-08 ENCOUNTER — Other Ambulatory Visit: Payer: Self-pay | Admitting: Internal Medicine

## 2023-12-08 DIAGNOSIS — I10 Essential (primary) hypertension: Secondary | ICD-10-CM

## 2023-12-08 DIAGNOSIS — I5042 Chronic combined systolic (congestive) and diastolic (congestive) heart failure: Secondary | ICD-10-CM

## 2023-12-24 ENCOUNTER — Other Ambulatory Visit: Payer: Self-pay | Admitting: Internal Medicine

## 2023-12-24 DIAGNOSIS — I5042 Chronic combined systolic (congestive) and diastolic (congestive) heart failure: Secondary | ICD-10-CM

## 2023-12-24 DIAGNOSIS — I10 Essential (primary) hypertension: Secondary | ICD-10-CM

## 2024-01-10 ENCOUNTER — Other Ambulatory Visit: Payer: Self-pay | Admitting: Internal Medicine

## 2024-01-10 DIAGNOSIS — E785 Hyperlipidemia, unspecified: Secondary | ICD-10-CM

## 2024-01-10 NOTE — Telephone Encounter (Signed)
 Copied from CRM [id number]. Topic: Clinical - Medication Refill >> Jan 10, 2024 11:16 AM Henretta I wrote: Medication: pravastatin  (PRAVACHOL ) 80 MG table  Has the patient contacted their pharmacy? Yes, pharmacy contacted us  to put in fill  (Agent: If no, request that the patient contact the pharmacy for the refill. If patient does not wish to contact the pharmacy document the reason why and proceed with request.) (Agent: If yes, when and what did the pharmacy advise?)  This is the patient's preferred pharmacy:  [facility], [place] - [address] [place] [address] Phone: [phone] Fax: [phone]  Is this the correct pharmacy for this prescription? Yes If no, delete pharmacy and type the correct one.   Has the prescription been filled recently? No  Is the patient out of the medication? No  Has the patient been seen for an appointment in the last year OR does the patient have an upcoming appointment? Yes  Can we respond through MyChart? Yes  Agent: Please be advised that Rx refills may take up to 3 business days. We ask that you follow-up with your pharmacy.

## 2024-01-11 ENCOUNTER — Other Ambulatory Visit: Payer: Self-pay | Admitting: Internal Medicine

## 2024-01-11 DIAGNOSIS — I5042 Chronic combined systolic (congestive) and diastolic (congestive) heart failure: Secondary | ICD-10-CM

## 2024-01-19 MED ORDER — PRAVASTATIN SODIUM 80 MG PO TABS: 80.0000 mg | ORAL_TABLET | Freq: Every day | ORAL | 0 refills | Status: DC

## 2024-01-23 ENCOUNTER — Other Ambulatory Visit: Payer: Self-pay | Admitting: Internal Medicine

## 2024-01-23 DIAGNOSIS — I5042 Chronic combined systolic (congestive) and diastolic (congestive) heart failure: Secondary | ICD-10-CM

## 2024-01-23 DIAGNOSIS — N1831 Chronic kidney disease, stage 3a: Secondary | ICD-10-CM

## 2024-01-23 DIAGNOSIS — E1121 Type 2 diabetes mellitus with diabetic nephropathy: Secondary | ICD-10-CM

## 2024-01-23 NOTE — Telephone Encounter (Unsigned)
 Copied from CRM [id number]. Topic: Clinical - Medication Refill >> Jan 23, 2024  8:53 AM Suzen RAMAN wrote: Medication:ENTRESTO  24-26 MG FARXIGA  10 MG TABS tablet zolpidem  (AMBIEN ) 5 MG tablet  !!PATIENT WOULD LIKE ENOUGH MEDICATION TO BRIDGE HIM TIL HIS UPCOMING APPOINTMENT WHICH IS February 13, 2024 WHICH IS ALSO THE SOONER CURRENTLY AVAILABLE FOR PROVIDER!!  Has the patient contacted their pharmacy? Yes   This is the patient's preferred pharmacy:   [id number] [place], [place] - [address] [place] [address] Phone: [phone] Fax: [phone]  Is this the correct pharmacy for this prescription? Yes If no, delete pharmacy and type the correct one.   Has the prescription been filled recently? No  Is the patient out of the medication? Yes  Has the patient been seen for an appointment in the last year OR does the patient have an upcoming appointment? Yes  Can we respond through MyChart? Yes  Agent: Please be advised that Rx refills may take up to 3 business days. We ask that you follow-up with your pharmacy.

## 2024-01-25 ENCOUNTER — Other Ambulatory Visit: Payer: Self-pay | Admitting: Internal Medicine

## 2024-01-25 ENCOUNTER — Other Ambulatory Visit: Payer: Self-pay | Admitting: Cardiology

## 2024-01-25 DIAGNOSIS — E785 Hyperlipidemia, unspecified: Secondary | ICD-10-CM

## 2024-01-25 DIAGNOSIS — M1000 Idiopathic gout, unspecified site: Secondary | ICD-10-CM

## 2024-01-26 ENCOUNTER — Other Ambulatory Visit: Payer: Self-pay | Admitting: Internal Medicine

## 2024-01-26 DIAGNOSIS — F5104 Psychophysiologic insomnia: Secondary | ICD-10-CM

## 2024-01-26 DIAGNOSIS — I5042 Chronic combined systolic (congestive) and diastolic (congestive) heart failure: Secondary | ICD-10-CM

## 2024-01-26 DIAGNOSIS — E1121 Type 2 diabetes mellitus with diabetic nephropathy: Secondary | ICD-10-CM

## 2024-01-26 DIAGNOSIS — N1831 Chronic kidney disease, stage 3a: Secondary | ICD-10-CM

## 2024-01-26 DIAGNOSIS — M1000 Idiopathic gout, unspecified site: Secondary | ICD-10-CM

## 2024-01-26 NOTE — Telephone Encounter (Signed)
 Copied from CRM [id number]. Topic: Clinical - Medication Refill >> Jan 26, 2024  8:51 AM Pinkey ORN wrote: Medication: ENTRESTO  24-26 MG, zolpidem  (AMBIEN ) 5 MG tablet, FARXIGA  10 MG TABS tablet  Has the patient contacted their pharmacy? Yes (Agent: If no, request that the patient contact the pharmacy for the refill. If patient does not wish to contact the pharmacy document the reason why and proceed with request.) (Agent: If yes, when and what did the pharmacy advise?)  This is the patient's preferred pharmacy:    [id number] [place], [place] - [address] [place] [address] Phone: [phone] Fax: [phone]  Is this the correct pharmacy for this prescription? Yes If no, delete pharmacy and type the correct one.   Has the prescription been filled recently? No  Is the patient out of the medication? Yes  Has the patient been seen for an appointment in the last year OR does the patient have an upcoming appointment? Yes  Can we respond through MyChart? Yes  Agent: Please be advised that Rx refills may take up to 3 business days. We ask that you follow-up with your pharmacy.

## 2024-01-29 ENCOUNTER — Other Ambulatory Visit: Payer: Self-pay | Admitting: Internal Medicine

## 2024-01-29 DIAGNOSIS — E785 Hyperlipidemia, unspecified: Secondary | ICD-10-CM

## 2024-01-29 NOTE — Telephone Encounter (Signed)
 Last OV 06/19/23 Next OV 02/13/24  Last refill 11/06/23 Qty #180/0

## 2024-01-30 LAB — HM DIABETES EYE EXAM

## 2024-01-30 NOTE — Telephone Encounter (Signed)
 Last OV 06/19/23 Next OV 02/13/24  Dose discrepancy - take 2 tablets by mouth

## 2024-01-31 NOTE — Telephone Encounter (Signed)
 Last OV 06/19/23, f/u 6 months Next OV 02/13/24  Last refill 01/19/24 Qty #30/0

## 2024-02-05 MED ORDER — SACUBITRIL-VALSARTAN 24-26 MG PO TABS: 1.0000 | ORAL_TABLET | Freq: Two times a day (BID) | ORAL | 0 refills | Status: DC

## 2024-02-05 MED ORDER — ZOLPIDEM TARTRATE 5 MG PO TABS
5.0000 mg | ORAL_TABLET | Freq: Every evening | ORAL | 0 refills | Status: AC | PRN
Start: 2024-02-05 — End: ?

## 2024-02-05 MED ORDER — DAPAGLIFLOZIN PROPANEDIOL 10 MG PO TABS: 10.0000 mg | ORAL_TABLET | Freq: Every day | ORAL | 0 refills | Status: DC

## 2024-02-06 ENCOUNTER — Other Ambulatory Visit: Payer: Self-pay | Admitting: Internal Medicine

## 2024-02-06 DIAGNOSIS — M1000 Idiopathic gout, unspecified site: Secondary | ICD-10-CM

## 2024-02-13 ENCOUNTER — Encounter: Payer: Self-pay | Admitting: Internal Medicine

## 2024-02-13 ENCOUNTER — Ambulatory Visit (INDEPENDENT_AMBULATORY_CARE_PROVIDER_SITE_OTHER): Admitting: Internal Medicine

## 2024-02-13 ENCOUNTER — Ambulatory Visit: Payer: Self-pay | Admitting: Internal Medicine

## 2024-02-13 ENCOUNTER — Ambulatory Visit: Attending: Internal Medicine

## 2024-02-13 VITALS — BP 126/68 | HR 47 | Temp 98.0°F | Ht 71.0 in | Wt 220.0 lb

## 2024-02-13 DIAGNOSIS — F5104 Psychophysiologic insomnia: Secondary | ICD-10-CM | POA: Diagnosis not present

## 2024-02-13 DIAGNOSIS — E1121 Type 2 diabetes mellitus with diabetic nephropathy: Secondary | ICD-10-CM

## 2024-02-13 DIAGNOSIS — R001 Bradycardia, unspecified: Secondary | ICD-10-CM

## 2024-02-13 DIAGNOSIS — I10 Essential (primary) hypertension: Secondary | ICD-10-CM

## 2024-02-13 DIAGNOSIS — N1831 Chronic kidney disease, stage 3a: Secondary | ICD-10-CM

## 2024-02-13 DIAGNOSIS — R972 Elevated prostate specific antigen [PSA]: Secondary | ICD-10-CM | POA: Diagnosis not present

## 2024-02-13 DIAGNOSIS — I5042 Chronic combined systolic (congestive) and diastolic (congestive) heart failure: Secondary | ICD-10-CM | POA: Diagnosis not present

## 2024-02-13 DIAGNOSIS — E785 Hyperlipidemia, unspecified: Secondary | ICD-10-CM

## 2024-02-13 DIAGNOSIS — M1000 Idiopathic gout, unspecified site: Secondary | ICD-10-CM | POA: Diagnosis not present

## 2024-02-13 DIAGNOSIS — N184 Chronic kidney disease, stage 4 (severe): Secondary | ICD-10-CM

## 2024-02-13 LAB — HEPATIC FUNCTION PANEL
ALT: 12 U/L (ref 0–53)
AST: 15 U/L (ref 0–37)
Albumin: 4.4 g/dL (ref 3.5–5.2)
Alkaline Phosphatase: 72 U/L (ref 39–117)
Bilirubin, Direct: 0.2 mg/dL (ref 0.0–0.3)
Total Bilirubin: 0.9 mg/dL (ref 0.2–1.2)
Total Protein: 7.4 g/dL (ref 6.0–8.3)

## 2024-02-13 LAB — LIPID PANEL
Cholesterol: 180 mg/dL (ref 0–200)
HDL: 55.4 mg/dL (ref >39.00)
LDL Cholesterol: 102 mg/dL — ABNORMAL HIGH (ref 0–99)
NonHDL: 125.02
Total CHOL/HDL Ratio: 3
Triglycerides: 115 mg/dL (ref 0.0–149.0)
VLDL: 23 mg/dL (ref 0.0–40.0)

## 2024-02-13 LAB — HEMOGLOBIN A1C: Hgb A1c MFr Bld: 6.6 % — ABNORMAL HIGH (ref 4.6–6.5)

## 2024-02-13 LAB — URINALYSIS, ROUTINE W REFLEX MICROSCOPIC
Bilirubin Urine: NEGATIVE
Hgb urine dipstick: NEGATIVE
Ketones, ur: NEGATIVE
Leukocytes,Ua: NEGATIVE
Nitrite: NEGATIVE
RBC / HPF: NONE SEEN (ref 0–?)
Specific Gravity, Urine: 1.01 (ref 1.000–1.030)
Total Protein, Urine: NEGATIVE
Urine Glucose: 250 — AB
Urobilinogen, UA: 0.2 (ref 0.0–1.0)
WBC, UA: NONE SEEN (ref 0–?)
pH: 6 (ref 5.0–8.0)

## 2024-02-13 LAB — CBC WITH DIFFERENTIAL/PLATELET
Basophils Absolute: 0.1 K/uL (ref 0.0–0.1)
Basophils Relative: 1.6 % (ref 0.0–3.0)
Eosinophils Absolute: 0.2 K/uL (ref 0.0–0.7)
Eosinophils Relative: 6.4 % — ABNORMAL HIGH (ref 0.0–5.0)
HCT: 44.1 % (ref 39.0–52.0)
Hemoglobin: 14.4 g/dL (ref 13.0–17.0)
Lymphocytes Relative: 32.3 % (ref 12.0–46.0)
Lymphs Abs: 1.1 K/uL (ref 0.7–4.0)
MCHC: 32.7 g/dL (ref 30.0–36.0)
MCV: 95.7 fl (ref 78.0–100.0)
Monocytes Absolute: 0.4 K/uL (ref 0.1–1.0)
Monocytes Relative: 11.4 % (ref 3.0–12.0)
Neutro Abs: 1.6 K/uL (ref 1.4–7.7)
Neutrophils Relative %: 48.3 % (ref 43.0–77.0)
Platelets: 196 K/uL (ref 150.0–400.0)
RBC: 4.61 Mil/uL (ref 4.22–5.81)
RDW: 14.4 % (ref 11.5–15.5)
WBC: 3.4 K/uL — ABNORMAL LOW (ref 4.0–10.5)

## 2024-02-13 LAB — BASIC METABOLIC PANEL WITH GFR
BUN: 33 mg/dL — ABNORMAL HIGH (ref 6–23)
CO2: 28 meq/L (ref 19–32)
Calcium: 9.1 mg/dL (ref 8.4–10.5)
Chloride: 102 meq/L (ref 96–112)
Creatinine, Ser: 2.71 mg/dL — ABNORMAL HIGH (ref 0.40–1.50)
GFR: 22.44 mL/min — ABNORMAL LOW (ref >60.00)
Glucose, Bld: 93 mg/dL (ref 70–99)
Potassium: 4.1 meq/L (ref 3.5–5.1)
Sodium: 140 meq/L (ref 135–145)

## 2024-02-13 LAB — PSA: PSA: 4.6 ng/mL — ABNORMAL HIGH (ref 0.10–4.00)

## 2024-02-13 LAB — MICROALBUMIN / CREATININE URINE RATIO
Creatinine,U: 55.2 mg/dL
Microalb Creat Ratio: UNDETERMINED mg/g (ref 0.0–30.0)
Microalb, Ur: <0.7 mg/dL

## 2024-02-13 LAB — TSH: TSH: 1.45 u[IU]/mL (ref 0.35–5.50)

## 2024-02-13 LAB — URIC ACID: Uric Acid, Serum: 8.7 mg/dL — ABNORMAL HIGH (ref 4.0–7.8)

## 2024-02-13 MED ORDER — SACUBITRIL-VALSARTAN 24-26 MG PO TABS: 1.0000 | ORAL_TABLET | Freq: Two times a day (BID) | ORAL | 0 refills | Status: DC

## 2024-02-13 NOTE — Patient Instructions (Signed)
 Bradycardia, Adult Bradycardia is a slower-than-normal heartbeat. A normal resting heart rate for an adult ranges from 60 to 100 beats per minute. With bradycardia, the resting heart rate is less than 60 beats per minute. Bradycardia can prevent enough oxygen  from reaching certain areas of your body when you are active. It can be serious if it keeps enough oxygen  from reaching your brain and other parts of your body. Bradycardia is not a problem for everyone. For some healthy adults, a slow resting heart rate is normal. What are the causes? This condition may be caused by: A problem with the heart, including: A problem with the heart's electrical system, such as a heart block. With a heart block, electrical signals between the chambers of the heart are partially or completely blocked, so they are not able to work as they should. A problem with the heart's natural pacemaker (sinus node). Heart disease. A heart attack. Heart damage. Lyme disease. A heart infection. A heart condition that is present at birth (congenital heart defect). Certain medicines that treat heart conditions. Certain conditions, such as hypothyroidism and obstructive sleep apnea. Problems with the balance of chemicals and other substances, like potassium, in the blood. Trauma. Radiation therapy. What increases the risk? You are more likely to develop this condition if you: Are age 30 or older. Have high blood pressure (hypertension), high cholesterol (hyperlipidemia), or diabetes. Drink heavily, use tobacco or nicotine products, or use drugs. What are the signs or symptoms? Symptoms of this condition include: Light-headedness. Feeling faint or fainting. Fatigue and weakness. Trouble with activity or exercise. Shortness of breath. Chest pain (angina). Drowsiness. Confusion. Dizziness. How is this diagnosed? This condition may be diagnosed based on: Your symptoms. Your medical history. A physical exam. During  the exam, your health care provider will listen to your heartbeat and check your pulse. To confirm the diagnosis, your health care provider may order tests, such as: Blood tests. An electrocardiogram (ECG). This test records the heart's electrical activity. The test can show how fast your heart is beating and whether the heartbeat is steady. A test in which you wear a portable device (event recorder or Holter monitor) to record your heart's electrical activity while you go about your day. An exercise test. How is this treated? Treatment for this condition depends on the cause of the condition and how severe your symptoms are. Treatment may involve: Treatment of the underlying condition. Changing your medicines or how much medicine you take. Having a small, battery-operated device called a pacemaker implanted under the skin. When bradycardia occurs, this device can be used to increase your heart rate and help your heart beat in a regular rhythm. Follow these instructions at home: Lifestyle Manage any health conditions that contribute to bradycardia as told by your health care provider. Follow a heart-healthy diet. A nutrition specialist (dietitian) can help educate you about healthy food options and changes. Follow an exercise program that is approved by your health care provider. Maintain a healthy weight. Try to reduce or manage your stress, such as with yoga or meditation. If you need help reducing stress, ask your health care provider. Do not use any products that contain nicotine or tobacco. These products include cigarettes, chewing tobacco, and vaping devices, such as e-cigarettes. If you need help quitting, ask your health care provider. Do not use illegal drugs. Alcohol  use If you drink alcohol : Limit how much you have to: 0-1 drink a day for women who are not pregnant. 0-2 drinks a day  for men. Know how much alcohol  is in a drink. In the [place] one drink equals one 12 oz bottle of  beer (355 mL), one 5 oz glass of wine (148 mL), or one 1 oz glass of hard liquor (44 mL). General instructions Take over-the-counter and prescription medicines only as told by your health care provider. Keep all follow-up visits. This is important. How is this prevented? In some cases, bradycardia may be prevented by: Treating underlying medical problems. Stopping behaviors or medicines that can trigger the condition. Contact a health care provider if: You feel light-headed or dizzy. You almost faint. You feel weak or are easily fatigued during physical activity. You experience confusion or have memory problems. Get help right away if: You faint. You have chest pains or an irregular heartbeat (palpitations). You have trouble breathing. These symptoms may represent a serious problem that is an emergency. Do not wait to see if the symptoms will go away. Get medical help right away. Call your local emergency services (911 in the [place] Do not drive yourself to the hospital. Summary Bradycardia is a slower-than-normal heartbeat. With bradycardia, the resting heart rate is less than 60 beats per minute. Treatment for this condition depends on the cause. Manage any health conditions that contribute to bradycardia as told by your health care provider. Do not use any products that contain nicotine or tobacco. These products include cigarettes, chewing tobacco, and vaping devices, such as e-cigarettes. Keep all follow-up visits. This is important. This information is not intended to replace advice given to you by your health care provider. Make sure you discuss any questions you have with your health care provider. Document Revised: 10/04/2020 Document Reviewed: 10/04/2020 Elsevier Patient Education  2024 [employer].

## 2024-02-13 NOTE — Progress Notes (Unsigned)
 EP to read.

## 2024-02-13 NOTE — Progress Notes (Signed)
 Subjective:  Patient ID: John Brookens., male    DOB: 1951/05/10  Age: 73 y.o. MRN: [id number]  CC: Hyperlipidemia, Diabetes, and Congestive Heart Failure   HPI John Wonda Raddle. presents for f/up ----  Discussed the use of AI scribe software for clinical note transcription with the patient, who gave verbal consent to proceed.  History of Present Illness John Lowden. is a 73 year old male who presents for medication refill and routine follow-up.  He ran out of his medication and was unable to get a refill until this appointment. He last saw the provider six months ago and typically has appointments every six months.  He had an eye exam two weeks ago and was supposed to have cataract surgery on his right eye on the sixteenth of the month.  He regularly visits a podiatrist for foot care and does not experience any discomfort from the length of his toenails.  No symptoms such as dizziness, lightheadedness, chest pain, shortness of breath, or leg pain when walking. No concerns about blood sugar levels being too high or too low. Bowel movements are regular.    Outpatient Medications Prior to Visit  Medication Sig Dispense Refill   allopurinol  (ZYLOPRIM ) 100 MG tablet TAKE 1 TABLET BY MOUTH EVERY DAY 90 tablet 1   dapagliflozin  propanediol (FARXIGA ) 10 MG TABS tablet Take 1 tablet (10 mg total) by mouth daily before breakfast. 30 tablet 0   SIMBRINZA 1-0.2 % SUSP Place 1 drop into both eyes in the morning and at bedtime.  3   spironolactone  (ALDACTONE ) 25 MG tablet TAKE 1 TABLET (25 MG TOTAL) BY MOUTH DAILY. 90 tablet 0   Timolol  Maleate, Once-Daily, 0.5 % SOLN Apply 1 drop to eye 2 (two) times daily.     torsemide  (DEMADEX ) 20 MG tablet TAKE 1 TABLET BY MOUTH EVERY DAY 90 tablet 0   Travoprost, BAK Free, (TRAVATAN) 0.004 % SOLN ophthalmic solution Place 1 drop into both eyes at bedtime.      triamcinolone  cream (KENALOG ) 0.1 % Apply 1 Application topically 2 (two) times daily.  30 g 0   zolpidem  (AMBIEN ) 5 MG tablet Take 1 tablet (5 mg total) by mouth at bedtime as needed for sleep. 30 tablet 0   Cholecalciferol  (VITAMIN D3) 50 MCG (2000 UT) capsule TAKE 1 CAPSULE BY MOUTH EVERY DAY 90 capsule 1   pravastatin  (PRAVACHOL ) 80 MG tablet TAKE 1 TABLET BY MOUTH ONCE DAILY *PATIENT NEEDS APPOINTMENT* 30 tablet 11   sacubitril -valsartan  (ENTRESTO ) 24-26 MG Take 1 tablet by mouth 2 (two) times daily. 60 tablet 0   Na Sulfate-K Sulfate-Mg Sulfate concentrate (SUPREP) 17.5-3.13-1.6 GM/177ML SOLN Take 1 kit by mouth as directed.     No facility-administered medications prior to visit.    ROS Review of Systems  Objective:  BP 126/68 (BP Location: Left Arm, Patient Position: Sitting, Cuff Size: Normal)   Pulse (!) 47   Temp 98 F (36.7 C) (Oral)   Ht 5' 11 (1.803 m)   Wt 220 lb (99.8 kg)   SpO2 94%   BMI 30.68 kg/m   BP Readings from Last 3 Encounters:  02/13/24 126/68  11/05/23 101/64  09/25/23 114/72    Wt Readings from Last 3 Encounters:  02/13/24 220 lb (99.8 kg)  11/05/23 228 lb (103.4 kg)  09/25/23 223 lb (101.2 kg)    Physical Exam Cardiovascular:     Rate and Rhythm: Regular rhythm. Bradycardia present.     Heart sounds:  Normal heart sounds, S1 normal and S2 normal.     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     Left lower leg: No edema.     Lab Results  Component Value Date   WBC 3.4 (L) 02/13/2024   HGB 14.4 02/13/2024   HCT 44.1 02/13/2024   PLT 196.0 02/13/2024   GLUCOSE 93 02/13/2024   CHOL 180 02/13/2024   TRIG 115.0 02/13/2024   HDL 55.40 02/13/2024   LDLDIRECT 140.7 02/27/2013   LDLCALC 102 (H) 02/13/2024   ALT 12 02/13/2024   AST 15 02/13/2024   NA 140 02/13/2024   K 4.1 02/13/2024   CL 102 02/13/2024   CREATININE 2.71 (H) 02/13/2024   BUN 33 (H) 02/13/2024   CO2 28 02/13/2024   TSH 1.45 02/13/2024   PSA 4.60 (H) 02/13/2024   HGBA1C 6.6 (H)  02/13/2024   MICROALBUR <0.7 02/13/2024    No results found.  Assessment & Plan:  Essential hypertension, benign -     Urinalysis, Routine w reflex microscopic; Future -     CBC with Differential/Platelet; Future -     Basic metabolic panel with GFR; Future  COMBINED HEART FAILURE, CHRONIC -     Sacubitril -Valsartan ; Take 1 tablet by mouth 2 (two) times daily.  Dispense: 180 tablet; Refill: 0  Type II diabetes mellitus with nephropathy (HCC) -     Microalbumin / creatinine urine ratio; Future -     Urinalysis, Routine w reflex microscopic; Future -     Hemoglobin A1c; Future -     Basic metabolic panel with GFR; Future -     HM Diabetes Foot Exam  Stage 3a chronic kidney disease (HCC) -     Microalbumin / creatinine urine ratio; Future -     Urinalysis, Routine w reflex microscopic; Future -     Basic metabolic panel with GFR; Future  Psychophysiological insomnia  Idiopathic gout, unspecified chronicity, unspecified site -     Basic metabolic panel with GFR; Future -     Uric acid; Future  Hyperlipidemia with target LDL less than 100 -     Lipid panel; Future -     Hepatic function panel; Future  Bradycardia -     EKG 12-Lead -     TSH; Future -     LONG TERM MONITOR (3-14 DAYS); Future  Rising PSA level -     PSA; Future  Stage 4 chronic kidney disease (HCC) -     Ambulatory referral to Nephrology     Follow-up: Return in about 6 months (around 08/15/2024).  Debby Molt, MD

## 2024-02-14 MED ORDER — ZOLPIDEM TARTRATE 5 MG PO TABS: 5.0000 mg | ORAL_TABLET | Freq: Every evening | ORAL | 0 refills | Status: AC | PRN

## 2024-02-14 MED ORDER — ATORVASTATIN CALCIUM 20 MG PO TABS: 20.0000 mg | ORAL_TABLET | Freq: Every day | ORAL | 0 refills | Status: DC

## 2024-02-14 MED ORDER — DAPAGLIFLOZIN PROPANEDIOL 10 MG PO TABS: 10.0000 mg | ORAL_TABLET | Freq: Every day | ORAL | 0 refills | Status: AC

## 2024-02-16 ENCOUNTER — Telehealth: Payer: Self-pay

## 2024-02-16 ENCOUNTER — Other Ambulatory Visit (HOSPITAL_COMMUNITY): Payer: Self-pay

## 2024-02-16 NOTE — Telephone Encounter (Signed)
 Pharmacy Patient Advocate Encounter   Received notification from Patient Pharmacy that prior authorization for Dapagliflozin  10mg  tabs is required/requested.   Insurance verification completed.   The patient is insured through [employer] .   Per test claim:  Farxiga  is preferred by the insurance.  If suggested medication is appropriate, Please send in a new RX and discontinue this one. If not, please advise as to why it's not appropriate so that we may request a Prior Authorization. Please note, some preferred medications may still require a PA.  If the suggested medications have not been trialed and there are no contraindications to their use, the PA will not be submitted, as it will not be approved.   Placed a call to [facility] and the [facility] was able to change to the brand name Farxiga  and received a $0 copay.

## 2024-03-28 ENCOUNTER — Other Ambulatory Visit: Payer: Self-pay | Admitting: Internal Medicine

## 2024-03-28 DIAGNOSIS — R001 Bradycardia, unspecified: Secondary | ICD-10-CM | POA: Diagnosis not present

## 2024-03-31 ENCOUNTER — Other Ambulatory Visit: Payer: Self-pay | Admitting: Internal Medicine

## 2024-03-31 DIAGNOSIS — I10 Essential (primary) hypertension: Secondary | ICD-10-CM

## 2024-03-31 DIAGNOSIS — I5042 Chronic combined systolic (congestive) and diastolic (congestive) heart failure: Secondary | ICD-10-CM

## 2024-04-19 ENCOUNTER — Other Ambulatory Visit: Payer: Self-pay | Admitting: Internal Medicine

## 2024-04-19 DIAGNOSIS — E785 Hyperlipidemia, unspecified: Secondary | ICD-10-CM

## 2024-04-19 DIAGNOSIS — I5042 Chronic combined systolic (congestive) and diastolic (congestive) heart failure: Secondary | ICD-10-CM

## 2024-05-10 ENCOUNTER — Ambulatory Visit: Admitting: Family Medicine

## 2024-05-10 NOTE — Progress Notes (Deleted)
   LILLETTE Ileana Collet, PhD, LAT, ATC acting as a scribe for Artist Lloyd, MD.  John Hogan. is a 73 y.o. male who presents to [facility] today for bilat shoulder pain. Pt was last seen by Dr. Lloyd in 2021 and was given bilat GH steroid injections.  Today, pt reports ***  Dx imaging: 01/30/18 R shoulder XR  Pertinent review of systems: ***  Relevant historical information: ***   Exam:  There were no vitals taken for this visit. General: Well Developed, well nourished, and in no acute distress.   MSK: ***    Lab and Radiology Results No results found for this or any previous visit (from the past 72 hours). No results found.     Assessment and Plan: 73 y.o. male with ***   PDMP not reviewed this encounter. No orders of the defined types were placed in this encounter.  No orders of the defined types were placed in this encounter.    Discussed warning signs or symptoms. Please see discharge instructions. Patient expresses understanding.   ***

## 2024-05-15 NOTE — Progress Notes (Unsigned)
   LILLETTE Ileana Collet, PhD, LAT, ATC acting as a scribe for Artist Lloyd, MD.  John Hogan. is a 73 y.o. male who presents to [facility] at [facility] today for bilat shoulder pain. Pt was last seen by Dr. Lloyd in 2021 and was given bilat GH steroid injections.  Today, pt reports ***  Dx imaging: 01/30/18 R shoulder XR  Pertinent review of systems: ***  Relevant historical information: ***   Exam:  There were no vitals taken for this visit. General: Well Developed, well nourished, and in no acute distress.   MSK: ***    Lab and Radiology Results No results found for this or any previous visit (from the past 72 hours). No results found.     Assessment and Plan: 73 y.o. male with ***   PDMP not reviewed this encounter. No orders of the defined types were placed in this encounter.  No orders of the defined types were placed in this encounter.    Discussed warning signs or symptoms. Please see discharge instructions. Patient expresses understanding.   ***

## 2024-05-16 ENCOUNTER — Ambulatory Visit (INDEPENDENT_AMBULATORY_CARE_PROVIDER_SITE_OTHER): Admitting: Family Medicine

## 2024-05-16 ENCOUNTER — Other Ambulatory Visit: Payer: Self-pay

## 2024-05-16 ENCOUNTER — Encounter: Payer: Self-pay | Admitting: Family Medicine

## 2024-05-16 ENCOUNTER — Ambulatory Visit

## 2024-05-16 VITALS — HR 49 | Ht 71.0 in | Wt 231.0 lb

## 2024-05-16 DIAGNOSIS — M25511 Pain in right shoulder: Secondary | ICD-10-CM

## 2024-05-16 DIAGNOSIS — M25512 Pain in left shoulder: Secondary | ICD-10-CM

## 2024-05-16 DIAGNOSIS — G8929 Other chronic pain: Secondary | ICD-10-CM | POA: Diagnosis not present

## 2024-05-16 NOTE — Patient Instructions (Addendum)
 Thank you for coming in today. Thank you for coming in today.   You received an injection today. Seek immediate medical attention if the joint becomes red, extremely painful, or is oozing fluid.   Please get an Xray today before you leave   See you back as needed.

## 2024-05-21 ENCOUNTER — Ambulatory Visit: Payer: Self-pay | Admitting: Family Medicine

## 2024-05-21 NOTE — Progress Notes (Signed)
 Right shoulder x-ray shows medium arthritis and likely chronic rotator cuff tear.

## 2024-05-21 NOTE — Progress Notes (Signed)
 Left shoulder x-ray shows a medium arthritis and evidence of chronic rotator cuff tear.

## 2024-07-03 ENCOUNTER — Other Ambulatory Visit: Payer: Self-pay | Admitting: Internal Medicine

## 2024-07-03 DIAGNOSIS — I5042 Chronic combined systolic (congestive) and diastolic (congestive) heart failure: Secondary | ICD-10-CM

## 2024-07-03 DIAGNOSIS — I10 Essential (primary) hypertension: Secondary | ICD-10-CM

## 2024-07-14 ENCOUNTER — Other Ambulatory Visit: Payer: Self-pay | Admitting: Internal Medicine

## 2024-07-14 DIAGNOSIS — E785 Hyperlipidemia, unspecified: Secondary | ICD-10-CM

## 2024-07-22 ENCOUNTER — Other Ambulatory Visit: Payer: Self-pay | Admitting: Internal Medicine

## 2024-07-22 DIAGNOSIS — F5104 Psychophysiologic insomnia: Secondary | ICD-10-CM

## 2024-07-22 DIAGNOSIS — E1121 Type 2 diabetes mellitus with diabetic nephropathy: Secondary | ICD-10-CM

## 2024-07-22 DIAGNOSIS — I5042 Chronic combined systolic (congestive) and diastolic (congestive) heart failure: Secondary | ICD-10-CM

## 2024-07-22 DIAGNOSIS — N184 Chronic kidney disease, stage 4 (severe): Secondary | ICD-10-CM
